# Patient Record
Sex: Female | Born: 1985 | Race: White | Hispanic: No | Marital: Married
Health system: Southern US, Academic
[De-identification: ages and names within clinical notes are randomized; demographics above are authoritative.]

## PROBLEM LIST (undated history)

## (undated) ENCOUNTER — Ambulatory Visit

## (undated) ENCOUNTER — Encounter

## (undated) ENCOUNTER — Encounter: Attending: Family | Primary: Family

## (undated) ENCOUNTER — Telehealth

## (undated) ENCOUNTER — Ambulatory Visit: Payer: MEDICARE

## (undated) ENCOUNTER — Ambulatory Visit
Payer: MEDICARE | Attending: Student in an Organized Health Care Education/Training Program | Primary: Student in an Organized Health Care Education/Training Program

## (undated) ENCOUNTER — Encounter
Attending: Student in an Organized Health Care Education/Training Program | Primary: Student in an Organized Health Care Education/Training Program

## (undated) ENCOUNTER — Inpatient Hospital Stay

## (undated) ENCOUNTER — Encounter: Attending: Physician Assistant | Primary: Physician Assistant

## (undated) ENCOUNTER — Ambulatory Visit: Attending: Pharmacist | Primary: Pharmacist

## (undated) ENCOUNTER — Telehealth
Attending: Student in an Organized Health Care Education/Training Program | Primary: Student in an Organized Health Care Education/Training Program

## (undated) ENCOUNTER — Ambulatory Visit
Attending: Student in an Organized Health Care Education/Training Program | Primary: Student in an Organized Health Care Education/Training Program

## (undated) ENCOUNTER — Ambulatory Visit: Payer: Medicare (Managed Care) | Attending: Family | Primary: Family

## (undated) VITALS — BP 138/108 | HR 79 | Temp 98.0°F | Resp 15 | Ht 65.0 in | Wt 270.0 lb

## (undated) DIAGNOSIS — F329 Major depressive disorder, single episode, unspecified: Secondary | ICD-10-CM

## (undated) DIAGNOSIS — M7730 Calcaneal spur, unspecified foot: Secondary | ICD-10-CM

## (undated) DIAGNOSIS — H9190 Unspecified hearing loss, unspecified ear: Secondary | ICD-10-CM

## (undated) DIAGNOSIS — M722 Plantar fascial fibromatosis: Secondary | ICD-10-CM

## (undated) DIAGNOSIS — E663 Overweight: Secondary | ICD-10-CM

## (undated) DIAGNOSIS — R351 Nocturia: Secondary | ICD-10-CM

## (undated) DIAGNOSIS — L309 Dermatitis, unspecified: Secondary | ICD-10-CM

## (undated) DIAGNOSIS — I10 Essential (primary) hypertension: Secondary | ICD-10-CM

## (undated) DIAGNOSIS — J302 Other seasonal allergic rhinitis: Secondary | ICD-10-CM

## (undated) DIAGNOSIS — K219 Gastro-esophageal reflux disease without esophagitis: Secondary | ICD-10-CM

## (undated) DIAGNOSIS — N200 Calculus of kidney: Secondary | ICD-10-CM

## (undated) DIAGNOSIS — Z87442 Personal history of urinary calculi: Secondary | ICD-10-CM

## (undated) DIAGNOSIS — N390 Urinary tract infection, site not specified: Secondary | ICD-10-CM

## (undated) DIAGNOSIS — F32A Depression, unspecified: Secondary | ICD-10-CM

## (undated) DIAGNOSIS — E669 Obesity, unspecified: Secondary | ICD-10-CM

## (undated) HISTORY — PX: TUBAL LIGATION: SHX77

## (undated) HISTORY — PX: TYMPANOSTOMY TUBE PLACEMENT: SHX32

## (undated) HISTORY — DX: Nocturia: R35.1

## (undated) HISTORY — DX: Major depressive disorder, single episode, unspecified: F32.9

## (undated) HISTORY — DX: Urinary tract infection, site not specified: N39.0

## (undated) HISTORY — DX: Calcaneal spur, unspecified foot: M77.30

## (undated) HISTORY — DX: Obesity, unspecified: E66.9

## (undated) HISTORY — DX: Calculus of kidney: N20.0

## (undated) HISTORY — DX: Depression, unspecified: F32.A

## (undated) HISTORY — DX: Dermatitis, unspecified: L30.9

## (undated) HISTORY — DX: Plantar fascial fibromatosis: M72.2

## (undated) HISTORY — DX: Overweight: E66.3

## (undated) HISTORY — DX: Unspecified hearing loss, unspecified ear: H91.90

---

## 2008-03-30 ENCOUNTER — Emergency Department: Payer: Self-pay | Admitting: Emergency Medicine

## 2009-04-09 ENCOUNTER — Ambulatory Visit: Payer: Self-pay | Admitting: Unknown Physician Specialty

## 2009-04-16 ENCOUNTER — Ambulatory Visit: Payer: Self-pay | Admitting: Unknown Physician Specialty

## 2010-08-22 ENCOUNTER — Emergency Department: Payer: Self-pay | Admitting: Emergency Medicine

## 2011-05-09 ENCOUNTER — Emergency Department: Payer: Self-pay | Admitting: Emergency Medicine

## 2011-05-12 ENCOUNTER — Emergency Department: Payer: Self-pay | Admitting: Emergency Medicine

## 2011-05-14 ENCOUNTER — Emergency Department: Payer: Self-pay | Admitting: Emergency Medicine

## 2011-07-31 ENCOUNTER — Ambulatory Visit: Payer: Self-pay | Admitting: Family Medicine

## 2011-08-07 ENCOUNTER — Emergency Department: Payer: Self-pay | Admitting: *Deleted

## 2011-11-17 ENCOUNTER — Ambulatory Visit: Payer: Self-pay | Admitting: Family Medicine

## 2012-02-01 ENCOUNTER — Encounter: Payer: Self-pay | Admitting: Maternal and Fetal Medicine

## 2012-02-13 ENCOUNTER — Observation Stay: Payer: Self-pay | Admitting: Obstetrics and Gynecology

## 2012-02-13 LAB — URINALYSIS, COMPLETE
Bacteria: NONE SEEN
Bilirubin,UR: NEGATIVE
Blood: NEGATIVE
Glucose,UR: NEGATIVE mg/dL
Ketone: NEGATIVE
Leukocyte Esterase: NEGATIVE
Nitrite: NEGATIVE
Ph: 6
Protein: NEGATIVE
RBC,UR: 1 /HPF
Specific Gravity: 1.01
Squamous Epithelial: 3
WBC UR: 1 /HPF

## 2012-03-13 ENCOUNTER — Observation Stay: Payer: Self-pay | Admitting: Obstetrics and Gynecology

## 2012-03-29 ENCOUNTER — Inpatient Hospital Stay: Payer: Self-pay

## 2012-03-29 LAB — PIH PROFILE
BUN: 6 mg/dL — ABNORMAL LOW (ref 7–18)
Co2: 24 mmol/L (ref 21–32)
HCT: 37.2 % (ref 35.0–47.0)
MCH: 28.6 pg (ref 26.0–34.0)
MCHC: 33.8 g/dL (ref 32.0–36.0)
Potassium: 3.9 mmol/L (ref 3.5–5.1)
RDW: 14.8 % — ABNORMAL HIGH (ref 11.5–14.5)
SGOT(AST): 11 U/L — ABNORMAL LOW (ref 15–37)
Sodium: 138 mmol/L (ref 136–145)
WBC: 14.2 10*3/uL — ABNORMAL HIGH (ref 3.6–11.0)

## 2012-03-29 LAB — PROTEIN / CREATININE RATIO, URINE: Protein/Creat. Ratio: 182 mg/gCREAT (ref 0–200)

## 2012-03-30 LAB — HEMATOCRIT: HCT: 29.5 % — ABNORMAL LOW (ref 35.0–47.0)

## 2012-09-07 ENCOUNTER — Emergency Department: Payer: Self-pay

## 2013-02-12 ENCOUNTER — Emergency Department: Payer: Self-pay | Admitting: Emergency Medicine

## 2013-02-17 ENCOUNTER — Ambulatory Visit: Payer: Self-pay | Admitting: Family Medicine

## 2013-03-17 ENCOUNTER — Emergency Department: Payer: Self-pay | Admitting: Emergency Medicine

## 2013-03-17 LAB — URINALYSIS, COMPLETE
Bilirubin,UR: NEGATIVE
Glucose,UR: NEGATIVE mg/dL (ref 0–75)
Ketone: NEGATIVE
Leukocyte Esterase: NEGATIVE
Protein: NEGATIVE
RBC,UR: 27 /HPF (ref 0–5)
Squamous Epithelial: 1
WBC UR: 3 /HPF (ref 0–5)

## 2013-03-17 LAB — CBC
MCH: 25.6 pg — ABNORMAL LOW (ref 26.0–34.0)
MCHC: 32.8 g/dL (ref 32.0–36.0)
RBC: 5.44 10*6/uL — ABNORMAL HIGH (ref 3.80–5.20)
RDW: 16.7 % — ABNORMAL HIGH (ref 11.5–14.5)

## 2013-04-03 ENCOUNTER — Encounter: Payer: Self-pay | Admitting: Obstetrics and Gynecology

## 2013-04-21 ENCOUNTER — Emergency Department: Payer: Self-pay | Admitting: Emergency Medicine

## 2013-04-21 LAB — URINALYSIS, COMPLETE
Bilirubin,UR: NEGATIVE
Blood: NEGATIVE
Glucose,UR: NEGATIVE mg/dL (ref 0–75)
Ph: 6 (ref 4.5–8.0)
Protein: NEGATIVE
RBC,UR: 1 /HPF (ref 0–5)
Specific Gravity: 1.017 (ref 1.003–1.030)
WBC UR: 5 /HPF (ref 0–5)

## 2013-04-21 LAB — COMPREHENSIVE METABOLIC PANEL
Alkaline Phosphatase: 81 U/L (ref 50–136)
Anion Gap: 8 (ref 7–16)
Calcium, Total: 9 mg/dL (ref 8.5–10.1)
Chloride: 107 mmol/L (ref 98–107)
Co2: 23 mmol/L (ref 21–32)
Creatinine: 0.55 mg/dL — ABNORMAL LOW (ref 0.60–1.30)
EGFR (African American): >60
EGFR (Non-African Amer.): >60
Glucose: 92 mg/dL (ref 65–99)
Osmolality: 272 (ref 275–301)
SGOT(AST): 18 U/L (ref 15–37)
Total Protein: 6.9 g/dL (ref 6.4–8.2)

## 2013-04-21 LAB — CBC
HCT: 38.6 % (ref 35.0–47.0)
HGB: 12.7 g/dL (ref 12.0–16.0)
MCHC: 33 g/dL (ref 32.0–36.0)
Platelet: 308 10*3/uL (ref 150–440)
RBC: 5 10*6/uL (ref 3.80–5.20)
WBC: 9.8 10*3/uL (ref 3.6–11.0)

## 2013-04-21 LAB — LIPASE, BLOOD: Lipase: 56 U/L — ABNORMAL LOW (ref 73–393)

## 2013-04-21 LAB — HCG, QUANTITATIVE, PREGNANCY: Beta Hcg, Quant.: 27331 m[IU]/mL — ABNORMAL HIGH

## 2013-04-21 LAB — WET PREP, GENITAL

## 2013-04-21 LAB — GC/CHLAMYDIA PROBE AMP

## 2013-04-22 LAB — URINE CULTURE

## 2013-05-08 ENCOUNTER — Encounter: Payer: Self-pay | Admitting: Maternal and Fetal Medicine

## 2013-05-22 ENCOUNTER — Encounter: Payer: Self-pay | Admitting: Obstetrics and Gynecology

## 2013-06-02 ENCOUNTER — Observation Stay: Payer: Self-pay | Admitting: Obstetrics and Gynecology

## 2013-07-17 ENCOUNTER — Encounter: Payer: Self-pay | Admitting: Maternal and Fetal Medicine

## 2013-08-03 ENCOUNTER — Observation Stay: Payer: Self-pay | Admitting: Obstetrics and Gynecology

## 2013-08-17 ENCOUNTER — Observation Stay: Payer: Self-pay | Admitting: Obstetrics and Gynecology

## 2013-08-24 ENCOUNTER — Observation Stay: Payer: Self-pay

## 2013-08-27 ENCOUNTER — Observation Stay: Payer: Self-pay | Admitting: Obstetrics and Gynecology

## 2013-08-30 ENCOUNTER — Observation Stay: Payer: Self-pay | Admitting: Obstetrics and Gynecology

## 2013-09-04 ENCOUNTER — Observation Stay: Payer: Self-pay | Admitting: Obstetrics and Gynecology

## 2013-09-05 ENCOUNTER — Observation Stay: Payer: Self-pay | Admitting: Obstetrics and Gynecology

## 2013-09-05 LAB — URINALYSIS, COMPLETE
Bilirubin,UR: NEGATIVE
Blood: NEGATIVE
Ph: 7 (ref 4.5–8.0)
Protein: 30
Specific Gravity: 1.015 (ref 1.003–1.030)
Squamous Epithelial: 14
WBC UR: 9 /HPF (ref 0–5)

## 2013-09-06 ENCOUNTER — Observation Stay: Payer: Self-pay | Admitting: Obstetrics and Gynecology

## 2013-09-11 ENCOUNTER — Observation Stay: Payer: Self-pay

## 2013-09-14 ENCOUNTER — Observation Stay: Payer: Self-pay

## 2013-09-18 ENCOUNTER — Observation Stay: Payer: Self-pay

## 2013-09-21 ENCOUNTER — Observation Stay: Payer: Self-pay | Admitting: Obstetrics and Gynecology

## 2013-09-25 ENCOUNTER — Observation Stay: Payer: Self-pay | Admitting: Obstetrics and Gynecology

## 2013-09-29 ENCOUNTER — Observation Stay: Payer: Self-pay

## 2013-10-01 ENCOUNTER — Inpatient Hospital Stay: Payer: Self-pay | Admitting: Obstetrics and Gynecology

## 2013-10-01 LAB — CBC WITH DIFFERENTIAL/PLATELET
Basophil %: 0.4 %
Eosinophil %: 0.3 %
HCT: 36.1 % (ref 35.0–47.0)
MCH: 29.7 pg (ref 26.0–34.0)
MCHC: 34.5 g/dL (ref 32.0–36.0)
MCV: 86 fL (ref 80–100)
Monocyte #: 0.7 x10 3/mm (ref 0.2–0.9)
Neutrophil #: 10 10*3/uL — ABNORMAL HIGH (ref 1.4–6.5)
Neutrophil %: 78.7 %
Platelet: 341 10*3/uL (ref 150–440)
RBC: 4.19 10*6/uL (ref 3.80–5.20)
WBC: 12.7 10*3/uL — ABNORMAL HIGH (ref 3.6–11.0)

## 2013-11-21 ENCOUNTER — Ambulatory Visit: Payer: Self-pay | Admitting: Obstetrics and Gynecology

## 2013-11-21 LAB — URINALYSIS, COMPLETE
Bacteria: NONE SEEN
Bilirubin,UR: NEGATIVE
Glucose,UR: NEGATIVE mg/dL (ref 0–75)
Ketone: NEGATIVE
Nitrite: NEGATIVE
Ph: 7 (ref 4.5–8.0)
RBC,UR: 1 /HPF (ref 0–5)
Specific Gravity: 1.016 (ref 1.003–1.030)

## 2013-12-01 ENCOUNTER — Ambulatory Visit: Payer: Self-pay | Admitting: Obstetrics and Gynecology

## 2014-02-23 DIAGNOSIS — M722 Plantar fascial fibromatosis: Secondary | ICD-10-CM | POA: Insufficient documentation

## 2014-02-23 DIAGNOSIS — M7730 Calcaneal spur, unspecified foot: Secondary | ICD-10-CM | POA: Insufficient documentation

## 2014-02-28 ENCOUNTER — Ambulatory Visit: Payer: Self-pay | Admitting: Podiatry

## 2014-03-14 DIAGNOSIS — M84376A Stress fracture, unspecified foot, initial encounter for fracture: Secondary | ICD-10-CM | POA: Insufficient documentation

## 2014-04-03 DIAGNOSIS — J309 Allergic rhinitis, unspecified: Secondary | ICD-10-CM | POA: Insufficient documentation

## 2014-05-01 ENCOUNTER — Emergency Department: Payer: Self-pay | Admitting: Emergency Medicine

## 2014-05-01 LAB — URINALYSIS, COMPLETE
Bilirubin,UR: NEGATIVE
Glucose,UR: NEGATIVE mg/dL (ref 0–75)
Ketone: NEGATIVE
NITRITE: NEGATIVE
PH: 5 (ref 4.5–8.0)
PROTEIN: NEGATIVE
RBC,UR: 2 /HPF (ref 0–5)
Specific Gravity: 1.012 (ref 1.003–1.030)
Squamous Epithelial: 1
WBC UR: 28 /HPF (ref 0–5)

## 2014-05-01 LAB — CBC
HCT: 35.5 % (ref 35.0–47.0)
HGB: 11.1 g/dL — ABNORMAL LOW (ref 12.0–16.0)
MCH: 21.8 pg — AB (ref 26.0–34.0)
MCHC: 31.4 g/dL — AB (ref 32.0–36.0)
MCV: 69 fL — ABNORMAL LOW (ref 80–100)
Platelet: 429 10*3/uL (ref 150–440)
RBC: 5.11 10*6/uL (ref 3.80–5.20)
RDW: 18.8 % — ABNORMAL HIGH (ref 11.5–14.5)
WBC: 14.6 10*3/uL — ABNORMAL HIGH (ref 3.6–11.0)

## 2014-05-01 LAB — BASIC METABOLIC PANEL
Anion Gap: 8 (ref 7–16)
BUN: 11 mg/dL (ref 7–18)
CALCIUM: 9.2 mg/dL (ref 8.5–10.1)
CREATININE: 1.39 mg/dL — AB (ref 0.60–1.30)
Chloride: 102 mmol/L (ref 98–107)
Co2: 24 mmol/L (ref 21–32)
EGFR (African American): >60
EGFR (Non-African Amer.): 52 — ABNORMAL LOW
Glucose: 95 mg/dL (ref 65–99)
Osmolality: 267 (ref 275–301)
Potassium: 2.8 mmol/L — ABNORMAL LOW (ref 3.5–5.1)
SODIUM: 134 mmol/L — AB (ref 136–145)

## 2014-05-01 LAB — LIPASE, BLOOD: Lipase: 64 U/L — ABNORMAL LOW (ref 73–393)

## 2014-08-09 ENCOUNTER — Emergency Department: Payer: Self-pay | Admitting: Emergency Medicine

## 2014-08-09 LAB — COMPREHENSIVE METABOLIC PANEL
ALBUMIN: 3.5 g/dL (ref 3.4–5.0)
ANION GAP: 8 (ref 7–16)
Alkaline Phosphatase: 90 U/L
BUN: 10 mg/dL (ref 7–18)
Bilirubin,Total: 0.4 mg/dL (ref 0.2–1.0)
CALCIUM: 8.8 mg/dL (ref 8.5–10.1)
CREATININE: 1.04 mg/dL (ref 0.60–1.30)
Chloride: 108 mmol/L — ABNORMAL HIGH (ref 98–107)
Co2: 23 mmol/L (ref 21–32)
EGFR (African American): >60
EGFR (Non-African Amer.): >60
Glucose: 127 mg/dL — ABNORMAL HIGH (ref 65–99)
OSMOLALITY: 278 (ref 275–301)
POTASSIUM: 4 mmol/L (ref 3.5–5.1)
SGOT(AST): 27 U/L (ref 15–37)
SGPT (ALT): 22 U/L
SODIUM: 139 mmol/L (ref 136–145)
Total Protein: 7.2 g/dL (ref 6.4–8.2)

## 2014-08-09 LAB — LIPASE, BLOOD: Lipase: 122 U/L (ref 73–393)

## 2014-08-09 LAB — CBC WITH DIFFERENTIAL/PLATELET
BASOS PCT: 0.7 %
Basophil #: 0.1 10*3/uL (ref 0.0–0.1)
Eosinophil #: 0.2 10*3/uL (ref 0.0–0.7)
Eosinophil %: 1.4 %
HCT: 40.5 % (ref 35.0–47.0)
HGB: 12.6 g/dL (ref 12.0–16.0)
Lymphocyte #: 2 10*3/uL (ref 1.0–3.6)
Lymphocyte %: 15.8 %
MCH: 23.3 pg — AB (ref 26.0–34.0)
MCHC: 31.2 g/dL — ABNORMAL LOW (ref 32.0–36.0)
MCV: 75 fL — AB (ref 80–100)
MONOS PCT: 4.7 %
Monocyte #: 0.6 x10 3/mm (ref 0.2–0.9)
Neutrophil #: 9.9 10*3/uL — ABNORMAL HIGH (ref 1.4–6.5)
Neutrophil %: 77.4 %
Platelet: 425 10*3/uL (ref 150–440)
RBC: 5.42 10*6/uL — ABNORMAL HIGH (ref 3.80–5.20)
RDW: 17.3 % — AB (ref 11.5–14.5)
WBC: 12.8 10*3/uL — AB (ref 3.6–11.0)

## 2014-08-09 LAB — URINALYSIS, COMPLETE
Bilirubin,UR: NEGATIVE
GLUCOSE, UR: NEGATIVE mg/dL (ref 0–75)
KETONE: NEGATIVE
NITRITE: NEGATIVE
PH: 7 (ref 4.5–8.0)
Protein: NEGATIVE
RBC,UR: 226 /HPF (ref 0–5)
Specific Gravity: 1.015 (ref 1.003–1.030)
Squamous Epithelial: 10
WBC UR: 137 /HPF (ref 0–5)

## 2014-08-16 DIAGNOSIS — N200 Calculus of kidney: Secondary | ICD-10-CM | POA: Insufficient documentation

## 2014-10-16 ENCOUNTER — Ambulatory Visit: Payer: Self-pay | Admitting: Urology

## 2014-10-22 ENCOUNTER — Ambulatory Visit: Payer: Self-pay | Admitting: Urology

## 2015-03-23 NOTE — Op Note (Signed)
PATIENT NAME:  Brittney Sanders, Brittney Sanders MR#:  [id number] DATE OF BIRTH:  November 26, 1986  DATE OF PROCEDURE:  12/01/2013  PREOPERATIVE DIAGNOSIS: Desires permanent sterilization.   POSTOPERATIVE DIAGNOSIS: Desires permanent sterilization.   PROCEDURE PERFORMED:  Laparoscopic tubal cautery.   SURGEON: Ricky L. Logan BoresEvans, Sanders.D.   ANESTHESIA: General endotracheal.   FINDINGS: Morbidly obese abdomen. Grossly normal uterus, tubes and ovaries.   ESTIMATED BLOOD LOSS: Minimal.   COMPLICATIONS: None.   DRAINS: In/out catheter with a latex-free Foley before the case with return of approximately 50 mL of clear urine.   PROCEDURE IN DETAIL: The patient was consented and stated understanding of permanence of tubal and the risk of ectopic, risk of surgery, etc., and also the options of less permanent means.  Fter obtaining informed consent, taken to the operating room and placed in the supine position where anesthesia was initiated and placed in the dorsal lithotomy position using Allen stirrups, prepped and draped in the usual sterile fashion.  The cervix was visualized.  Hulka tenaculum was placed, bladder was drained.  Area at 6 o'clock of the umbilicus was infused with 0.5% Sensorcaine, then a #15 blade was (Dictation Anomaly) incision through which a 10 mm port was placed. Pneumoperitoneum was established. The patient was placed in as steep a Trendelenburg as could be tolerated due to the patient's obesity, and tubes were visualized and bilateral tubal cautery was carried out in the mid ampullary region in the usual fashion using Kleppinger forceps.    Areas were seen to be hemostatic. Pneumoperitoneum was allowed to resolve. Port removed. The incision was closed with deep of 0. Band-Aid was placed. The patient tolerated the procedure well.   I anticipate a routine postoperative course.   A prescription for Norco 5/325, 30 of those, one half to 2, q.6 p.r.n. pain. No refills were given, then prescriptions for light  activity, etc., and I will see her back in the office in 2 weeks.    ____________________________ Reatha Harpsicky L. Logan BoresEvans, MD rle:dmm D: 12/01/2013 08:04:40 ET T: 12/01/2013 09:29:49 ET JOB#: [id number]  cc: Ricky L. Logan BoresEvans, MD, <Dictator> Augustina MoodICK L Vernadette Stutsman MD ELECTRONICALLY SIGNED 12/01/2013 12:46

## 2015-03-30 ENCOUNTER — Emergency Department
Admit: 2015-03-30 | Discharge: 2015-03-31 | Disposition: A | Discharge status: Home / Self Care | Payer: Medicare HMO | Source: Ambulatory Visit | Attending: Emergency Medicine | Admitting: Emergency Medicine

## 2015-03-30 DIAGNOSIS — Z792 Long term (current) use of antibiotics: Secondary | ICD-10-CM | POA: Diagnosis not present

## 2015-03-30 DIAGNOSIS — J069 Acute upper respiratory infection, unspecified: Secondary | ICD-10-CM

## 2015-03-30 DIAGNOSIS — J029 Acute pharyngitis, unspecified: Secondary | ICD-10-CM | POA: Diagnosis present

## 2015-03-30 HISTORY — DX: Other seasonal allergic rhinitis: J30.2

## 2015-03-31 ENCOUNTER — Encounter: Payer: Self-pay | Admitting: Emergency Medicine

## 2015-03-31 MED ORDER — DEXAMETHASONE SODIUM PHOSPHATE 10 MG/ML IJ SOLN
INTRAMUSCULAR | Status: AC
  Filled 2015-03-31: qty 1

## 2015-03-31 MED ORDER — AMOXICILLIN 500 MG PO CAPS
ORAL_CAPSULE | ORAL | Status: AC
  Filled 2015-03-31: qty 1

## 2015-03-31 MED ORDER — AMOXICILLIN 500 MG PO CAPS
500.0000 mg | ORAL_CAPSULE | Freq: Once | ORAL | Status: AC
  Administered 2015-03-31: 500 mg via ORAL

## 2015-03-31 MED ORDER — DEXAMETHASONE SODIUM PHOSPHATE 10 MG/ML IJ SOLN
10.0000 mg | Freq: Once | INTRAMUSCULAR | Status: AC
  Administered 2015-03-31: 10 mg via INTRAMUSCULAR

## 2015-03-31 MED ORDER — MAGIC MOUTHWASH: 10.0000 mL | Freq: Three times a day (TID) | ORAL | Status: DC | PRN

## 2015-03-31 MED ORDER — MAGIC MOUTHWASH
10.0000 mL | Freq: Once | ORAL | Status: AC
  Administered 2015-03-31: 10 mL via ORAL
  Filled 2015-03-31 (×2): qty 10

## 2015-03-31 MED ORDER — AMOXICILLIN 500 MG PO CAPS: 500.0000 mg | ORAL_CAPSULE | Freq: Three times a day (TID) | ORAL | Status: DC

## 2015-03-31 NOTE — ED Notes (Signed)
Pt reports experiencing cough, sore throat, voice hoarsness, and nasal congestion for the past 2 weeks. Pt denies hx of any respiratory illness. Pt reports hx of seasonal allergies.

## 2015-03-31 NOTE — ED Provider Notes (Signed)
[facility] Emergency Department Provider Note    ____________________________________________  Time seen: 0250  I have reviewed the triage vital signs and the nursing notes.  ________ HISTORY ________  Chief Complaint Cough; Sore Throat; and Nasal Congestion     HPI Brittney Sanders is a 29 y.o. female who presents with a two-week history of nonproductive cough, congestion, sore throat.Patient states symptoms are getting worse. Complains of 10 out of 10 pain to throat. Denies fever, chills, nausea, vomiting, shortness of breath.     Past Medical History  Diagnosis Date  . Seasonal allergies     There are no active problems to display for this patient.   History reviewed. No pertinent past surgical history.  Current Outpatient Rx  Name  Route  Sig  Dispense  Refill  . Alum & Mag Hydroxide-Simeth (MAGIC MOUTHWASH) SOLN   Oral   Take 10 mLs by mouth 3 (three) times daily as needed (throat pain).   30 mL   0   . amoxicillin (AMOXIL) 500 MG capsule   Oral   Take 1 capsule (500 mg total) by mouth 3 (three) times daily.   21 capsule   0     Allergies Sulfa antibiotics  History reviewed. No pertinent family history.  Social History History  Substance Use Topics  . Smoking status: Never Smoker   . Smokeless tobacco: Not on file  . Alcohol Use: No    Review of Systems  Constitutional: Negative for fever. Eyes: Negative for visual changes. ENT: Positive for sore throat. Positive for nasal congestion. Cardiovascular: Negative for chest pain. Respiratory: Negative for shortness of breath. Positive for cough. Gastrointestinal: Negative for abdominal pain, vomiting and diarrhea. Genitourinary: Negative for dysuria. Musculoskeletal: Negative for back pain. Skin: Negative for rash. Neurological: Negative for headaches, focal weakness or numbness.  10-point ROS otherwise negative.  _______________ PHYSICAL  EXAM: _______________  VITAL SIGNS:  ED Triage Vitals  Enc Vitals Group     BP 03/31/15 0037 117/80 mmHg     Pulse Rate 03/31/15 0037 77     Resp 03/31/15 0037 14     Temp 03/31/15 0037 98 F (36.7 C)     Temp Source 03/31/15 0037 Oral     SpO2 03/31/15 0037 99 %     Weight --      Height --      Head Cir --      Peak Flow --      Pain Score 03/31/15 0140 8     Pain Loc --      Pain Edu? --      Excl. in GC? --      Constitutional: Alert and oriented. Well appearing and in no distress. Eyes: Conjunctivae are normal. PERRL. Normal extraocular movements. ENT   Head: Normocephalic and atraumatic.   Nose: Congestion/rhinnorhea.   Mouth/Throat: Mucous membranes are moist. Mild pharyngeal erythema. Hoarse voice. Mild bilateral tonsillar swelling. No peritonsillar abscess. No tonsillar exudates.   Neck: No stridor.  Hematological/Lymphatic/Immunilogical: Shotty anterior cervical lymphadenopathy. Cardiovascular: Normal rate, regular rhythm. Normal and symmetric distal pulses are present in all extremities. No murmurs, rubs, or gallops. Respiratory: Normal respiratory effort without tachypnea nor retractions. Breath sounds are clear and equal bilaterally. No wheezes/rales/rhonchi. Gastrointestinal: Soft and nontender. No distention. No abdominal bruits. There is no CVA tenderness. Genitourinary: Deferred Musculoskeletal: Nontender with normal range of motion in all extremities. No joint effusions.   Right lower leg:  No tenderness or edema.   Left  lower leg:  No tenderness or edema. Neurologic:  Normal speech and language. No gross focal neurologic deficits are appreciated. Speech is normal. No gait instability. Skin:  Skin is warm, dry and intact. No rash noted. Psychiatric: Mood and affect are normal. Speech and behavior are normal. Patient exhibits appropriate insight and judgment.  ____ EKG ____  None   ___________ RADIOLOGY ___________  Chest x-ray  per Dr. Reche Dixonalbot, reviewed by me: No acute cardiopulmonary process.  _____________ PROCEDURES _____________  Procedure(s) performed: None  Critical Care performed: None   ______________________________________________________ INITIAL IMPRESSION / ASSESSMENT AND PLAN / ED COURSE ______________________________________________________  Pertinent labs & imaging results that were available during my care of the patient were reviewed by me and considered in my medical decision making (see chart for details).  29 year old female with upper respiratory illness and pharyngitis. Single intramuscular dose of Decadron in ED, amoxicillin, Dukes Magic mouthwash, follow up PCP. Room air sats 96% on discharge. Discussed with patient and given strict return precautions. Patient verbalizes understanding and agrees.   ____________________________________________ FINAL CLINICAL IMPRESSION(S) / ED DIAGNOSES ____________________________________________  Final diagnoses:  Pharyngitis  Upper respiratory infection       Irean HongJade J Sung, MD 03/31/15 [phone]

## 2015-03-31 NOTE — ED Notes (Signed)
MD delay explained to pt.

## 2015-03-31 NOTE — Discharge Instructions (Signed)
1. TAKE ANTIBIOTIC AS PRESCRIBED (AMOXICILLIN  THREE TIMES DAILY X 7 DAYS). 2. USE DUKE'S MAGIC MOUTHWASH AS NEEDED FOR SORE THROAT. 3. RETURN TO THE ER FOR WORSENING SYMPTOMS, PERSISTENT VOMITING, DIFFICULTY BREATHING OR OTHER CONCERNS.   Pharyngitis Pharyngitis is a sore throat (pharynx). There is redness, pain, and swelling of your throat. HOME CARE   Drink enough fluids to keep your pee (urine) clear or pale yellow.  Only take medicine as told by your doctor.  You may get sick again if you do not take medicine as told. Finish your medicines, even if you start to feel better.  Do not take aspirin.  Rest.  Rinse your mouth (gargle) with salt water ( tsp of salt per 1 qt of water) every 1-2 hours. This will help the pain.  If you are not at risk for choking, you can suck on hard candy or sore throat lozenges. GET HELP IF:  You have large, tender lumps on your neck.  You have a rash.  You cough up green, yellow-brown, or bloody spit. GET HELP RIGHT AWAY IF:   You have a stiff neck.  You drool or cannot swallow liquids.  You throw up (vomit) or are not able to keep medicine or liquids down.  You have very bad pain that does not go away with medicine.  You have problems breathing (not from a stuffy nose). MAKE SURE YOU:   Understand these instructions.  Will watch your condition.  Will get help right away if you are not doing well or get worse. Document Released: 05/04/2008 Document Revised: 09/06/2013 Document Reviewed: 07/24/2013 [facility] Patient Information 2015 [place], [place]. This information is not intended to replace advice given to you by your health care provider. Make sure you discuss any questions you have with your health care provider.  Salt Water Gargle This solution will help make your mouth and throat feel better. HOME CARE INSTRUCTIONS   Mix 1 teaspoon of salt in 8 ounces of warm water.  Gargle with this solution as much or often as you  need or as directed. Swish and gargle gently if you have any sores or wounds in your mouth.  Do not swallow this mixture. Document Released: 08/20/2004 Document Revised: 02/08/2012 Document Reviewed: 01/11/2009 [facility] Patient Information 2015 [place], [place]. This information is not intended to replace advice given to you by your health care provider. Make sure you discuss any questions you have with your health care provider.  Upper Respiratory Infection, Adult An upper respiratory infection (URI) is also sometimes known as the common cold. The upper respiratory tract includes the nose, sinuses, throat, trachea, and bronchi. Bronchi are the airways leading to the lungs. Most people improve within 1 week, but symptoms can last up to 2 weeks. A residual cough may last even longer.  CAUSES Many different viruses can infect the tissues lining the upper respiratory tract. The tissues become irritated and inflamed and often become very moist. Mucus production is also common. A cold is contagious. You can easily spread the virus to others by oral contact. This includes kissing, sharing a glass, coughing, or sneezing. Touching your mouth or nose and then touching a surface, which is then touched by another person, can also spread the virus. SYMPTOMS  Symptoms typically develop 1 to 3 days after you come in contact with a cold virus. Symptoms vary from person to person. They may include:  Runny nose.  Sneezing.  Nasal congestion.  Sinus irritation.  Sore throat.  Loss  of voice (laryngitis).  Cough.  Fatigue.  Muscle aches.  Loss of appetite.  Headache.  Low-grade fever. DIAGNOSIS  You might diagnose your own cold based on familiar symptoms, since most people get a cold 2 to 3 times a year. Your caregiver can confirm this based on your exam. Most importantly, your caregiver can check that your symptoms are not due to another disease such as strep throat, sinusitis, pneumonia, asthma, or  epiglottitis. Blood tests, throat tests, and X-rays are not necessary to diagnose a common cold, but they may sometimes be helpful in excluding other more serious diseases. Your caregiver will decide if any further tests are required. RISKS AND COMPLICATIONS  You may be at risk for a more severe case of the common cold if you smoke cigarettes, have chronic heart disease (such as heart failure) or lung disease (such as asthma), or if you have a weakened immune system. The very young and very old are also at risk for more serious infections. Bacterial sinusitis, middle ear infections, and bacterial pneumonia can complicate the common cold. The common cold can worsen asthma and chronic obstructive pulmonary disease (COPD). Sometimes, these complications can require emergency medical care and may be life-threatening. PREVENTION  The best way to protect against getting a cold is to practice good hygiene. Avoid oral or hand contact with people with cold symptoms. Wash your hands often if contact occurs. There is no clear evidence that vitamin C, vitamin E, echinacea, or exercise reduces the chance of developing a cold. However, it is always recommended to get plenty of rest and practice good nutrition. TREATMENT  Treatment is directed at relieving symptoms. There is no cure. Antibiotics are not effective, because the infection is caused by a virus, not by bacteria. Treatment may include:  Increased fluid intake. Sports drinks offer valuable electrolytes, sugars, and fluids.  Breathing heated mist or steam (vaporizer or shower).  Eating chicken soup or other clear broths, and maintaining good nutrition.  Getting plenty of rest.  Using gargles or lozenges for comfort.  Controlling fevers with ibuprofen or acetaminophen as directed by your caregiver.  Increasing usage of your inhaler if you have asthma. Zinc gel and zinc lozenges, taken in the first 24 hours of the common cold, can shorten the duration  and lessen the severity of symptoms. Pain medicines may help with fever, muscle aches, and throat pain. A variety of non-prescription medicines are available to treat congestion and runny nose. Your caregiver can make recommendations and may suggest nasal or lung inhalers for other symptoms.  HOME CARE INSTRUCTIONS   Only take over-the-counter or prescription medicines for pain, discomfort, or fever as directed by your caregiver.  Use a warm mist humidifier or inhale steam from a shower to increase air moisture. This may keep secretions moist and make it easier to breathe.  Drink enough water and fluids to keep your urine clear or pale yellow.  Rest as needed.  Return to work when your temperature has returned to normal or as your caregiver advises. You may need to stay home longer to avoid infecting others. You can also use a face mask and careful hand washing to prevent spread of the virus. SEEK MEDICAL CARE IF:   After the first few days, you feel you are getting worse rather than better.  You need your caregiver's advice about medicines to control symptoms.  You develop chills, worsening shortness of breath, or brown or red sputum. These may be signs of pneumonia.  You  develop yellow or brown nasal discharge or pain in the face, especially when you bend forward. These may be signs of sinusitis.  You develop a fever, swollen neck glands, pain with swallowing, or white areas in the back of your throat. These may be signs of strep throat. SEEK IMMEDIATE MEDICAL CARE IF:   You have a fever.  You develop severe or persistent headache, ear pain, sinus pain, or chest pain.  You develop wheezing, a prolonged cough, cough up blood, or have a change in your usual mucus (if you have chronic lung disease).  You develop sore muscles or a stiff neck. Document Released: 05/12/2001 Document Revised: 02/08/2012 Document Reviewed: 02/21/2014 [facility] Patient Information 2015 [place], [place]. This  information is not intended to replace advice given to you by your health care provider. Make sure you discuss any questions you have with your health care provider.

## 2015-04-09 NOTE — H&P (Signed)
L&D Evaluation:  History:   HPI 29 yo G1P0 @ 37.1wks EDC 04/02/12 by 6wk uls presents at 39 3/7 weeks with c/o constant lower back pain and contractions which she rated as 8 out of 10. Denies bleeding or LOF. +nausea with vomiting x1 her in L&D.   +FM  [facility].  Preg c/b obesity (BMI 47) and initial dx of GDM based on elevated 1hr glucola and 2 abnomal values on 3hr OGTT.  She was [facility] at [facility] for evaluation of this and told that she did not have GDM based on their criteria. EFW at 30 weeks was 3#12oz (62.3%) She is also hearing impaired.  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  [facility] and a third is pending. 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    Patient's Surgical History Ear surgery (myringotomies x2 and tympanoplasty)    Medications Pre Natal Vitamins  ASA 81 mgm daily    Allergies Sulfa    Social History none    Family History Non-Contributory   ROS:   ROS see HPI   Exam:   Vital Signs initially BPs134/90, 131/87, 141/91, 142/92, 143/84, 134/76, now 118/72    Urine Protein pro/cr=182 mgm    General appears uncomfortable, rocking while sitting on couch    Mental Status clear    Chest clear    Heart normal sinus rhythm, no murmur/gallop/rubs    Abdomen gravid, non-tender    Estimated Fetal Weight 8#    Fetal Position cephalic (OP) on US    Edema 2+    Reflexes 2+    Pelvic no external lesions, changed from 2/90%/OOP to 2-3/90%/-2 per RN exam    Mebranes Intact    FHT normal rate with no decels, 135 with accels to 160s    FHT Description moderate vaiability    Ucx irregular, and difficult to pick up with toco    Other PIH labs WNL   Impression:    Impression IUP at 39 3/7 weeks ?in early labor/latent labor   Plan:   Plan Discussed therapeutic rest-and pt agrees. Morphine and Phenergan IM now.   Electronic Signatures: Trinna BalloonGutierrez, Mikenzi Raysor L (CNM)  (Signed 30-Apr-13 08:24)  Authored: L&D Evaluation   Last Updated: 30-Apr-13 08:24 by Trinna BalloonGutierrez, Shareen Capwell L (CNM)

## 2015-04-09 NOTE — H&P (Signed)
L&Sanders Evaluation:  History:   HPI 29 yo G1at [redacted] wks GA by U/S.  Pregnancy has been complicated by obesity (BMI >40).  She presents with occasional cramps. She notes +FM, no LOF, no VB. O+, RI, HBsAg neg, GBS unk    Patient's Medical History Asthma  obesity    Patient's Surgical History cosmetic ear surgery    Medications Pre Natal Vitamins  ProAir HFA    Allergies PCN, Sulfa    Social History none    Family History Non-Contributory   ROS:   ROS All systems were reviewed.  HEENT, CNS, GI, GU, Respiratory, CV, Renal and Musculoskeletal systems were found to be normal.   Exam:   Vital Signs stable    General no apparent distress    Mental Status clear    Chest clear    Heart normal sinus rhythm    Abdomen gravid, non-tender    Back no CVAT    Edema no edema    Pelvic no external lesions, cervix closed and thick    Mebranes Intact    FHT normal rate with no decels    FHT Description 125/mod var/+accels/no decels    Skin no lesions    Other UA negative   Impression:   Impression no evidence of preterm labor   Plan:   Plan UA, EFM/NST, discharge    Follow Up Appointment already scheduled. 3/25 [facility]   Electronic Signatures: Brittney Sanders, Brittney Sanders (MD)  (Signed 16-Mar-13 06:06)  Authored: L&Sanders Evaluation   Last Updated: 16-Mar-13 06:06 by Brittney Sanders, Brittney Sanders (MD)

## 2015-04-09 NOTE — H&P (Signed)
L&D Evaluation:  History:  HPI 29 y/o G2P1001 @ 38+wks EDC 10/12/13 arrives with SROM clear fluid small show, irregular mild uc's, baby moving well. Care @ [facility] Elevated ONTD with nl US. Close interconceptual spacing. 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  HEENT, CNS, GI, GU, Respiratory, CV, Renal and Musculoskeletal systems were found to be normal.   Exam:  Vital Signs stable   Urine Protein not completed   General no apparent distress   Mental Status clear   Chest clear   Heart normal sinus rhythm   Abdomen gravid, non-tender   Estimated Fetal Weight Average for gestational age   Fetal Position vtx   Fundal Height term   Back no CVAT   Edema 1+  pedal   Reflexes 1+   Clonus negative   Pelvic no external lesions, 3-4cm 80% vtx @ -1 clear fluid nl show   Mebranes Ruptured   Description clear   FHT 130'3 140's baseline with accels   Ucx irregular, mild mod   Skin dry   Lymph no lymphadenopathy   Impression:  Impression early labor   Plan:  Plan EFM/NST, monitor contractions and for cervical change   Comments Admitted, knows what to expect 2nd baby. Plans natural childbirth, family at bedside supportive.   Electronic Signatures: Albertina ParrLugiano, Sheamus Hasting B (CNM)  (Signed [phone]-Nov-14 11:13)  Authored: L&D Evaluation   Last Updated: 02-Nov-14 11:13 by Albertina ParrLugiano, Daquavion Catala B (CNM)

## 2015-04-09 NOTE — H&P (Signed)
L&D Evaluation:  History:   HPI 29 yo G1P0 @ 37.1wks EDC 04/02/12 by 6wk uls presents c/o LOF.  She reports going to the bathroom to check because her panties were wet.  Denies a gush of fluid.  Has had no leakage since that initial episode.  +FM, no ctxs or VB.  [facility].  Preg c/b obesity (BMI 47) and initial dx of GDM based on elevated 1hr glucola and 2 abnomal values on 3hr OGTT.  [facility] of this and told that she did not have GDM based on their criteria.  She is also hearing impaired.  She was found to be MRSA pos in [facility] and was treated but never had documentation of neg testing afterwards.  [facility] was pos for Staph aureus.    Presents with leaking fluid    Patient's Medical History No Chronic Illness    Patient's Surgical History Ear surgery    Medications Pre Natal Vitamins    Allergies Sulfa    Social History none    Family History Non-Contributory   ROS:   ROS see HPI, all others reviewed and neg   Exam:   Vital Signs stable    General no apparent distress    Mental Status clear    Abdomen gravid, non-tender, obese    Pelvic no external lesions, cervix closed and thick    Mebranes Intact, Neg pool, nitrazine and fern    FHT normal rate with no decels    Fetal Heart Rate 140     Ucx absent    Skin dry   Impression:   Impression 37.1wk IUP, leaking fluid r/o SROM   Plan:   Comments Exam neg for spontaneous rupture of membranes.  NST reactive.  Pt given labor preecautions.  To keep sch appt this coming Thursday.    Follow Up Appointment already scheduled   Electronic Signatures: Senaida LangeWeaver-Lee, Corinna Burkman (MD)  (Signed 14-Apr-13 18:48)  Authored: L&D Evaluation   Last Updated: 14-Apr-13 18:48 by Senaida LangeWeaver-Lee, Mayukha Symmonds (MD)

## 2015-08-06 ENCOUNTER — Encounter: Payer: Self-pay | Admitting: *Deleted

## 2015-08-06 DIAGNOSIS — L02211 Cutaneous abscess of abdominal wall: Secondary | ICD-10-CM | POA: Diagnosis not present

## 2015-08-06 DIAGNOSIS — L03311 Cellulitis of abdominal wall: Secondary | ICD-10-CM | POA: Insufficient documentation

## 2015-08-06 DIAGNOSIS — Z792 Long term (current) use of antibiotics: Secondary | ICD-10-CM | POA: Diagnosis not present

## 2015-08-06 NOTE — ED Notes (Signed)
Pt has abscess to right lower abdomen.  Area red,swollen and painful.  Sx for 3 days.

## 2015-08-07 ENCOUNTER — Emergency Department
Admission: EM | Admit: 2015-08-07 | Discharge: 2015-08-07 | Disposition: A | Discharge status: Home / Self Care | Payer: Medicare HMO | Attending: Emergency Medicine | Admitting: Emergency Medicine

## 2015-08-07 DIAGNOSIS — L0390 Cellulitis, unspecified: Secondary | ICD-10-CM

## 2015-08-07 DIAGNOSIS — L0291 Cutaneous abscess, unspecified: Secondary | ICD-10-CM

## 2015-08-07 MED ORDER — DOXYCYCLINE HYCLATE 100 MG PO TABS
100.0000 mg | ORAL_TABLET | Freq: Once | ORAL | Status: AC
  Administered 2015-08-07: 100 mg via ORAL
  Filled 2015-08-07: qty 1

## 2015-08-07 MED ORDER — LIDOCAINE HCL (PF) 1 % IJ SOLN
INTRAMUSCULAR | Status: AC
  Administered 2015-08-07: 5 mL via INTRADERMAL
  Filled 2015-08-07: qty 5

## 2015-08-07 MED ORDER — DOXYCYCLINE HYCLATE 100 MG PO CAPS: ORAL_CAPSULE | ORAL | Status: DC

## 2015-08-07 MED ORDER — CEPHALEXIN 500 MG PO CAPS
500.0000 mg | ORAL_CAPSULE | Freq: Once | ORAL | Status: AC
  Administered 2015-08-07: 500 mg via ORAL
  Filled 2015-08-07: qty 1

## 2015-08-07 MED ORDER — LIDOCAINE HCL (PF) 1 % IJ SOLN
5.0000 mL | Freq: Once | INTRAMUSCULAR | Status: AC
  Administered 2015-08-07: 5 mL via INTRADERMAL

## 2015-08-07 MED ORDER — LIDOCAINE-EPINEPHRINE (PF) 1 %-1:200000 IJ SOLN
INTRAMUSCULAR | Status: AC
  Filled 2015-08-07: qty 30

## 2015-08-07 MED ORDER — CEPHALEXIN 500 MG PO CAPS: 500.0000 mg | ORAL_CAPSULE | Freq: Four times a day (QID) | ORAL | Status: DC

## 2015-08-07 NOTE — ED Provider Notes (Signed)
[facility] Emergency Department Provider Note  ____________________________________________  Time seen: Approximately 2:42 AM  I have reviewed the triage vital signs and the nursing notes.   HISTORY  Chief Complaint Abscess    HPI Brittney Sanders is a 29 y.o. female presents with worsening abscess on RLQ of abdomen for about 3 days.  History of abscesses requiring I&D.  Denies fever/chills, N/V/D, abdominal pain other than at the site.  Report large area of redness.  States she has been trying to open it herself and vigorously squeezing the region trying to get out the pus. Describes onset as gradual, severe in intensity.   Past Medical History  Diagnosis Date  . Seasonal allergies     There are no active problems to display for this patient.   No past surgical history on file.  Current Outpatient Rx  Name  Route  Sig  Dispense  Refill  . Alum & Mag Hydroxide-Simeth (MAGIC MOUTHWASH) SOLN   Oral   Take 10 mLs by mouth 3 (three) times daily as needed (throat pain).   30 mL   0   . amoxicillin (AMOXIL) 500 MG capsule   Oral   Take 1 capsule (500 mg total) by mouth 3 (three) times daily.   21 capsule   0   . cephALEXin (KEFLEX) 500 MG capsule   Oral   Take 1 capsule (500 mg total) by mouth 4 (four) times daily.   40 capsule   0   . doxycycline (VIBRAMYCIN) 100 MG capsule      Take 1 capsule (100 mg) by mouth twice daily for 10 days.   20 capsule   0     Allergies Sulfa antibiotics  No family history on file.  Social History Social History  Substance Use Topics  . Smoking status: Never Smoker   . Smokeless tobacco: None  . Alcohol Use: No    Review of Systems Constitutional: No fever/chills Eyes: No visual changes. ENT: No sore throat. Cardiovascular: Denies chest pain. Respiratory: Denies shortness of breath. Gastrointestinal: No abdominal pain except at abscess.  No nausea, no vomiting.  No diarrhea.  No  constipation. Genitourinary: Negative for dysuria. Musculoskeletal: Negative for back pain. Skin: abscess on RLQ Neurological: Negative for headaches, focal weakness or numbness.  10-point ROS otherwise negative.  ____________________________________________   PHYSICAL EXAM:  VITAL SIGNS: ED Triage Vitals  Enc Vitals Group     BP 08/06/15 2233 127/66 mmHg     Pulse Rate 08/06/15 2233 72     Resp 08/06/15 2233 20     Temp 08/06/15 2233 98.5 F (36.9 C)     Temp Source 08/06/15 2233 Oral     SpO2 08/06/15 2233 99 %     Weight 08/06/15 2233 258 lb (117.028 kg)     Height 08/06/15 2233  (1.6 m)     Head Cir --      Peak Flow --      Pain Score 08/06/15 2235 6     Pain Loc --      Pain Edu? --      Excl. in GC? --     Constitutional: Alert and oriented. Well appearing and in no acute distress. Eyes: Conjunctivae are normal. PERRL. EOMI. Mouth/Throat: Mucous membranes are moist.  Oropharynx non-erythematous. Cardiovascular: Normal rate, regular rhythm. Grossly normal heart sounds.  Good peripheral circulation. Respiratory: Normal respiratory effort.  No retractions. Lungs CTAB. Gastrointestinal: Obese. Soft and nontender. No distention. See Skin  exam. Musculoskeletal: No lower extremity tenderness nor edema.  No joint effusions. Neurologic:  Normal speech and language. No gross focal neurologic deficits are appreciated.  Skin:  Large area of ecchymosis on lower abdomen (right and central).  Some surrounding cellulitis with central 4-cm induration and an open, partially draining purulence eschar 1-cm in diameter.  No surrounding fluctuance.  Otherwise skin is warm, dry and intact.   Of note, I took a photo of the area with the [facility] app, which you can find under Chart Review.  Look at the Media tab.  ____________________________________________   LABS (all labs ordered are listed, but only abnormal results are displayed)  Labs Reviewed - No data to  display ____________________________________________  EKG  Not indicated ____________________________________________  RADIOLOGY   No results found.  ____________________________________________   PROCEDURES  Procedure(s) performed: I&D, see procedure note(s).   INCISION AND DRAINAGE Performed by: Loleta Rose Consent: Verbal consent obtained. Risks and benefits: risks, benefits and alternatives were discussed Type: abscess  Body area: right lower abdomen  Anesthesia: local infiltration  Incision was made with a scalpel.  Local anesthetic: lidocaine 1% without epinephrine  Anesthetic total: 5 ml  Complexity: complex Blunt dissection to break up loculations  Drainage: mostly blood, some purulence  Drainage amount: ~ 5mL  Packing material: none, applied clean gauze outer dressing  Patient tolerance: Patient tolerated the procedure well with no immediate complications.     Critical Care performed: No ____________________________________________   INITIAL IMPRESSION / ASSESSMENT AND PLAN / ED COURSE  Pertinent labs & imaging results that were available during my care of the patient were reviewed by me and considered in my medical decision making (see chart for details).  Unsure about the cause of the extensive ecchymosis and less the patient has been squeezing it so vigorously she has bruised her own skin.  Though there clearly was some drainage from the central area of purulent/eschar, there is still induration and it is not currently draining.  To help make sure that it was adequately drained, I performed an I&D as described above.  Minimal additional purulence was produced, but now will continue to drain.  Given the small size and the most recent evidence based recommendations, I did not pack the wound with iodoform.  I am going to treat with doxycycline given the patient's sulfa allergy making Bactrim not recommended, as well as Keflex for the surrounding  cellulitis.  I recommended that she follow up with her primary care doctor within 2-3 days or return to the emergency department if she gets worse.  (Of note, I took a Building services engineer of the area with the [facility] app, which you can find under Chart Review.  Look at the Media tab.) ____________________________________________  FINAL CLINICAL IMPRESSION(S) / ED DIAGNOSES  Final diagnoses:  Abscess and cellulitis      NEW MEDICATIONS STARTED DURING THIS VISIT:  New Prescriptions   CEPHALEXIN (KEFLEX) 500 MG CAPSULE    Take 1 capsule (500 mg total) by mouth 4 (four) times daily.   DOXYCYCLINE (VIBRAMYCIN) 100 MG CAPSULE    Take 1 capsule (100 mg) by mouth twice daily for 10 days.     Loleta Rose, MD 08/07/15 [phone]

## 2015-08-07 NOTE — Discharge Instructions (Signed)
You have been seen in the Emergency Department (ED) today for an abscess.  This was drained in the ED. ° °Please follow up with your doctor or in the ED in 24-48 hours for recheck of your wound.  Read through the additional discharge instructions included below regarding wound care recommendations.  Keep the wound clean and dry, though you may wash as you would normally.  Change the dressing twice daily. ° °Call your doctor sooner or return to the ED if you develop worsening signs of infection such as: increased redness, increased pain, pus, or fever. ° ° °Abscess °An abscess is an infected area that contains a collection of pus and debris. It can occur in almost any part of the body. An abscess is also known as a furuncle or boil. °CAUSES  °An abscess occurs when tissue gets infected. This can occur from blockage of oil or sweat glands, infection of Cretella follicles, or a minor injury to the skin. As the body tries to fight the infection, pus collects in the area and creates pressure under the skin. This pressure causes pain. People with weakened immune systems have difficulty fighting infections and get certain abscesses more often.  °SYMPTOMS °Usually an abscess develops on the skin and becomes a painful mass that is red, warm, and tender. If the abscess forms under the skin, you may feel a moveable soft area under the skin. Some abscesses break open (rupture) on their own, but most will continue to get worse without care. The infection can spread deeper into the body and eventually into the bloodstream, causing you to feel ill.  °DIAGNOSIS  °Your caregiver will take your medical history and perform a physical exam. A sample of fluid may also be taken from the abscess to determine what is causing your infection. °TREATMENT  °Your caregiver may prescribe antibiotic medicines to fight the infection. However, taking antibiotics alone usually does not cure an abscess. Your caregiver may need to make a small cut  (incision) in the abscess to drain the pus. In some cases, gauze is packed into the abscess to reduce pain and to continue draining the area. °HOME CARE INSTRUCTIONS  °Only take over-the-counter or prescription medicines for pain, discomfort, or fever as directed by your caregiver. °If you were prescribed antibiotics, take them as directed. Finish them even if you start to feel better. °If gauze is used, follow your caregiver's directions for changing the gauze. °To avoid spreading the infection: °Keep your draining abscess covered with a bandage. °Wash your hands well. °Do not share personal care items, towels, or whirlpools with others. °Avoid skin contact with others. °Keep your skin and clothes clean around the abscess. °Keep all follow-up appointments as directed by your caregiver. °SEEK MEDICAL CARE IF:  °You have increased pain, swelling, redness, fluid drainage, or bleeding. °You have muscle aches, chills, or a general ill feeling. °You have a fever. °MAKE SURE YOU:  °Understand these instructions. °Will watch your condition. °Will get help right away if you are not doing well or get worse. °Document Released: 08/26/2005 Document Revised: 05/17/2012 Document Reviewed: 01/29/2012 °ExitCare® Patient Information ©2015 [employer]. This information is not intended to replace advice given to you by your health care provider. Make sure you discuss any questions you have with your health care provider. ° °Abscess °Care After °An abscess (also called a boil or furuncle) is an infected area that contains a collection of pus. Signs and symptoms of an abscess include pain, tenderness, redness, or hardness,   or you may feel a moveable soft area under your skin. An abscess can occur anywhere in the body. The infection may spread to surrounding tissues causing cellulitis. A cut (incision) by the surgeon was made over your abscess and the pus was drained out. Gauze may have been packed into the space to provide a drain  that will allow the cavity to heal from the inside outwards. The boil may be painful for 5 to 7 days. Most people with a boil do not have high fevers. Your abscess, if seen early, may not have localized, and may not have been lanced. If not, another appointment may be required for this if it does not get better on its own or with medications. °HOME CARE INSTRUCTIONS  °Only take over-the-counter or prescription medicines for pain, discomfort, or fever as directed by your caregiver. °When you bathe, soak and then remove gauze or iodoform packs at least daily or as directed by your caregiver. You may then wash the wound gently with mild soapy water. Repack with gauze or do as your caregiver directs. °SEEK IMMEDIATE MEDICAL CARE IF:  °You develop increased pain, swelling, redness, drainage, or bleeding in the wound site. °You develop signs of generalized infection including muscle aches, chills, fever, or a general ill feeling. °An oral temperature above 102° F (38.9° C) develops, not controlled by medication. °See your caregiver for a recheck if you develop any of the symptoms described above. If medications (antibiotics) were prescribed, take them as directed. °Document Released: 06/04/2005 Document Revised: 02/08/2012 Document Reviewed: 01/30/2008 °ExitCare® Patient Information ©2015 [employer]. This information is not intended to replace advice given to you by your health care provider. Make sure you discuss any questions you have with your health care provider. ° °Cellulitis °Cellulitis is an infection of the skin and the tissue beneath it. The infected area is usually red and tender. Cellulitis occurs most often in the arms and lower legs.  °CAUSES  °Cellulitis is caused by bacteria that enter the skin through cracks or cuts in the skin. The most common types of bacteria that cause cellulitis are staphylococci and streptococci. °SIGNS AND SYMPTOMS  °Redness and warmth. °Swelling. °Tenderness or  pain. °Fever. °DIAGNOSIS  °Your health care provider can usually determine what is wrong based on a physical exam. Blood tests may also be done. °TREATMENT  °Treatment usually involves taking an antibiotic medicine. °HOME CARE INSTRUCTIONS  °Take your antibiotic medicine as directed by your health care provider. Finish the antibiotic even if you start to feel better. °Keep the infected arm or leg elevated to reduce swelling. °Apply a warm cloth to the affected area up to 4 times per day to relieve pain. °Take medicines only as directed by your health care provider. °Keep all follow-up visits as directed by your health care provider. °SEEK MEDICAL CARE IF:  °You notice red streaks coming from the infected area. °Your red area gets larger or turns dark in color. °Your bone or joint underneath the infected area becomes painful after the skin has healed. °Your infection returns in the same area or another area. °You notice a swollen bump in the infected area. °You develop new symptoms. °You have a fever. °SEEK IMMEDIATE MEDICAL CARE IF:  °You feel very sleepy. °You develop vomiting or diarrhea. °You have a general ill feeling (malaise) with muscle aches and pains. °MAKE SURE YOU:  °Understand these instructions. °Will watch your condition. °Will get help right away if you are not doing well or get   worse. °Document Released: 08/26/2005 Document Revised: 04/02/2014 Document Reviewed: 02/01/2012 °ExitCare® Patient Information ©2015 [employer]. This information is not intended to replace advice given to you by your health care provider. Make sure you discuss any questions you have with your health care provider. ° ° ° °

## 2015-12-16 ENCOUNTER — Emergency Department
Admission: EM | Admit: 2015-12-16 | Discharge: 2015-12-16 | Disposition: A | Discharge status: Home / Self Care | Payer: Medicare HMO | Attending: Emergency Medicine | Admitting: Emergency Medicine

## 2015-12-16 ENCOUNTER — Encounter: Payer: Self-pay | Admitting: Emergency Medicine

## 2015-12-16 ENCOUNTER — Emergency Department: Payer: Medicare HMO

## 2015-12-16 DIAGNOSIS — Z792 Long term (current) use of antibiotics: Secondary | ICD-10-CM | POA: Insufficient documentation

## 2015-12-16 DIAGNOSIS — N12 Tubulo-interstitial nephritis, not specified as acute or chronic: Secondary | ICD-10-CM | POA: Insufficient documentation

## 2015-12-16 DIAGNOSIS — Z3202 Encounter for pregnancy test, result negative: Secondary | ICD-10-CM | POA: Diagnosis not present

## 2015-12-16 DIAGNOSIS — R109 Unspecified abdominal pain: Secondary | ICD-10-CM

## 2015-12-16 HISTORY — DX: Calculus of kidney: N20.0

## 2015-12-16 LAB — LIPASE, BLOOD: Lipase: 23 U/L (ref 11–51)

## 2015-12-16 LAB — COMPREHENSIVE METABOLIC PANEL
ALK PHOS: 72 U/L (ref 38–126)
ALT: 13 U/L — ABNORMAL LOW (ref 14–54)
ANION GAP: 7 (ref 5–15)
AST: 17 U/L (ref 15–41)
Albumin: 4 g/dL (ref 3.5–5.0)
BUN: 11 mg/dL (ref 6–20)
CALCIUM: 9.1 mg/dL (ref 8.9–10.3)
CO2: 21 mmol/L — AB (ref 22–32)
Chloride: 108 mmol/L (ref 101–111)
Creatinine, Ser: 0.91 mg/dL (ref 0.44–1.00)
GFR calc Af Amer: >60 mL/min (ref >60)
GFR calc non Af Amer: >60 mL/min (ref >60)
GLUCOSE: 107 mg/dL — AB (ref 65–99)
POTASSIUM: 3.6 mmol/L (ref 3.5–5.1)
SODIUM: 136 mmol/L (ref 135–145)
Total Bilirubin: 1 mg/dL (ref 0.3–1.2)
Total Protein: 7.2 g/dL (ref 6.5–8.1)

## 2015-12-16 LAB — URINALYSIS COMPLETE WITH MICROSCOPIC (ARMC ONLY)
BILIRUBIN URINE: NEGATIVE
Bacteria, UA: NONE SEEN
GLUCOSE, UA: NEGATIVE mg/dL
KETONES UR: NEGATIVE mg/dL
NITRITE: NEGATIVE
Protein, ur: NEGATIVE mg/dL
SPECIFIC GRAVITY, URINE: 1.01 (ref 1.005–1.030)
pH: 6 (ref 5.0–8.0)

## 2015-12-16 LAB — CBC
HCT: 41 % (ref 35.0–47.0)
Hemoglobin: 13.7 g/dL (ref 12.0–16.0)
MCH: 26.7 pg (ref 26.0–34.0)
MCHC: 33.3 g/dL (ref 32.0–36.0)
MCV: 80.2 fL (ref 80.0–100.0)
Platelets: 276 10*3/uL (ref 150–440)
RBC: 5.12 MIL/uL (ref 3.80–5.20)
RDW: 15.2 % — AB (ref 11.5–14.5)
WBC: 18.7 10*3/uL — AB (ref 3.6–11.0)

## 2015-12-16 LAB — POCT PREGNANCY, URINE: PREG TEST UR: NEGATIVE

## 2015-12-16 MED ORDER — ONDANSETRON 4 MG PO TBDP: 4.0000 mg | ORAL_TABLET | Freq: Three times a day (TID) | ORAL | Status: DC | PRN

## 2015-12-16 MED ORDER — LEVOFLOXACIN 750 MG PO TABS: 750.0000 mg | ORAL_TABLET | Freq: Every day | ORAL | Status: AC

## 2015-12-16 MED ORDER — OXYCODONE-ACETAMINOPHEN 5-325 MG PO TABS: 1.0000 | ORAL_TABLET | Freq: Four times a day (QID) | ORAL | Status: DC | PRN

## 2015-12-16 MED ORDER — LEVOFLOXACIN IN D5W 750 MG/150ML IV SOLN
750.0000 mg | Freq: Once | INTRAVENOUS | Status: AC
  Administered 2015-12-16: 750 mg via INTRAVENOUS
  Filled 2015-12-16: qty 150

## 2015-12-16 MED ORDER — KETOROLAC TROMETHAMINE 30 MG/ML IJ SOLN
INTRAMUSCULAR | Status: AC
  Filled 2015-12-16: qty 1

## 2015-12-16 MED ORDER — MORPHINE SULFATE (PF) 4 MG/ML IV SOLN
4.0000 mg | Freq: Once | INTRAVENOUS | Status: AC
  Administered 2015-12-16: 4 mg via INTRAVENOUS
  Filled 2015-12-16: qty 1

## 2015-12-16 MED ORDER — KETOROLAC TROMETHAMINE 30 MG/ML IJ SOLN
30.0000 mg | Freq: Once | INTRAMUSCULAR | Status: AC
  Administered 2015-12-16: 30 mg via INTRAVENOUS

## 2015-12-16 NOTE — ED Provider Notes (Signed)
-----------------------------------------   8:11 AM on 12/16/2015 -----------------------------------------   Blood pressure 114/65, pulse 95, temperature 98.2 F (36.8 C), temperature source Oral, resp. rate 14, last menstrual period 12/02/2015, SpO2 100 %.  Assuming care from Dr. Alphonzo LemmingsMcShane.  In short, Brittney Sanders is a 30 y.o. female with a chief complaint of Flank Pain .  Refer to the original H&P for additional details.  The current plan of care is to follow the patient's pain control. She feels improved and reexamination of her abdomen shows no peritoneal signs. Patient has plans for discharge with written prescriptions for Percocet, Zofran, and Cipro. Patient I felt was improved enough for discharge at this time.   Jennye MoccasinBrian S Quigley, MD 12/16/15 [phone]

## 2015-12-16 NOTE — ED Notes (Signed)
Patient ambulatory to triage with steady gait, without difficulty or distress noted; pt reports awakening at 2am with left flank/side pain accomp by nausea; st hx kidney stones

## 2015-12-16 NOTE — ED Notes (Signed)
Report to stephanie, rn

## 2015-12-16 NOTE — ED Notes (Signed)
Pt provided ginger ale for PO challenge  

## 2015-12-16 NOTE — ED Notes (Signed)
Pt awoken from sound sleep to administer toradol. Pt continues to report pain 10/10 after awoken.

## 2015-12-16 NOTE — ED Notes (Signed)
Patient transported to CT 

## 2015-12-16 NOTE — ED Provider Notes (Addendum)
[facility] Emergency Department Provider Note  ____________________________________________   I have reviewed the triage vital signs and the nursing notes.   HISTORY  Chief Complaint Flank Pain    HPI Brittney Sanders is a 30 y.o. female suffers from morbid obesity and kidney stones presents with sudden onset left flank pain. No vomiting. No fever. No dysuria. Did have some nausea. Pain began suddenly around 2:00. Feels like a kidney stone.Not have any dysuria or urinary frequency.  Past Medical History  Diagnosis Date  . Seasonal allergies   . Kidney stone     There are no active problems to display for this patient.   Past Surgical History  Procedure Laterality Date  . Tympanostomy tube placement      Current Outpatient Rx  Name  Route  Sig  Dispense  Refill  . Alum & Mag Hydroxide-Simeth (MAGIC MOUTHWASH) SOLN   Oral   Take 10 mLs by mouth 3 (three) times daily as needed (throat pain).   30 mL   0   . amoxicillin (AMOXIL) 500 MG capsule   Oral   Take 1 capsule (500 mg total) by mouth 3 (three) times daily.   21 capsule   0   . cephALEXin (KEFLEX) 500 MG capsule   Oral   Take 1 capsule (500 mg total) by mouth 4 (four) times daily.   40 capsule   0   . doxycycline (VIBRAMYCIN) 100 MG capsule      Take 1 capsule (100 mg) by mouth twice daily for 10 days.   20 capsule   0     Allergies Sulfa antibiotics  No family history on file.  Social History Social History  Substance Use Topics  . Smoking status: Never Smoker   . Smokeless tobacco: None  . Alcohol Use: No    Review of Systems Constitutional: No fever/chills Eyes: No visual changes. ENT: No sore throat. No stiff neck no neck pain Cardiovascular: Denies chest pain. Respiratory: Denies shortness of breath. Gastrointestinal:   no vomiting.  No diarrhea.  No constipation. Genitourinary: Negative for dysuria. Musculoskeletal: Negative lower extremity swelling Skin:  Negative for rash. Neurological: Negative for headaches, focal weakness or numbness. 10-point ROS otherwise negative.  ____________________________________________   PHYSICAL EXAM:  VITAL SIGNS: ED Triage Vitals  Enc Vitals Group     BP 12/16/15 0552 118/72 mmHg     Pulse Rate 12/16/15 0552 98     Resp 12/16/15 0552 14     Temp 12/16/15 0552 98.2 F (36.8 C)     Temp Source 12/16/15 0552 Oral     SpO2 12/16/15 0552 97 %     Weight --      Height --      Head Cir --      Peak Flow --      Pain Score 12/16/15 0524 10     Pain Loc --      Pain Edu? --      Excl. in GC? --     Constitutional: Alert and oriented. Well appearing and in no acute distress. The pain went to the room Eyes: Conjunctivae are normal. PERRL. EOMI. Head: Atraumatic. Nose: No congestion/rhinnorhea. Mouth/Throat: Mucous membranes are moist.  Oropharynx non-erythematous. Neck: No stridor.   Nontender with no meningismus Cardiovascular: Normal rate, regular rhythm. Grossly normal heart sounds.  Good peripheral circulation. Respiratory: Normal respiratory effort.  No retractions. Lungs CTAB. Abdominal: Soft and nontender. No distention. No guarding no rebound Back:  There is no focal tenderness or step off there is no midline tenderness there are no lesions noted. there is mild left CVA tenderness Musculoskeletal: No lower extremity tenderness. No joint effusions, no DVT signs strong distal pulses no edema Neurologic:  Normal speech and language. No gross focal neurologic deficits are appreciated.  Skin:  Skin is warm, dry and intact. No rash noted. Psychiatric: Mood and affect are normal. Speech and behavior are normal.  ____________________________________________   LABS (all labs ordered are listed, but only abnormal results are displayed)  Labs Reviewed  CBC - Abnormal; Notable for the following:    WBC 18.7 (*)    RDW 15.2 (*)    All other components within normal limits  URINALYSIS  COMPLETEWITH MICROSCOPIC ([facility] ONLY)  LIPASE, BLOOD  COMPREHENSIVE METABOLIC PANEL  POC URINE PREG, ED  POCT PREGNANCY, URINE   ____________________________________________  EKG  I personally interpreted any EKGs ordered by me or triage  ____________________________________________  RADIOLOGY  I reviewed any imaging ordered by me or triage that were performed during my shift ____________________________________________   PROCEDURES  Procedure(s) performed: None  Critical Care performed: None  ____________________________________________   INITIAL IMPRESSION / ASSESSMENT AND PLAN / ED COURSE  Pertinent labs & imaging results that were available during my care of the patient were reviewed by me and considered in my medical decision making (see chart for details).  Patient with reproducible left flank pain which began suddenly in the middle the night with a history of kidney stones. White count is elevated but he certainly could just be due to stress from the pain. This time she is much better pain control but apparently the pain was quite significant before. We'll give her Toradol, and we will await further information from blood work. Patient is nontoxic and well appearing and actually sleeping when I entered.   ----------------------------------------- 7:17 AM on 12/16/2015 -----------------------------------------    Giving abx. No vomiting here. Pt appears by labs to have pyelonephritis.  Does now admit to chills.   ----------------------------------------- 7:32 AM on 12/16/2015 -----------------------------------------  No evidence of infected kidney stone patient feels better we are giving her morphine and she still has some residual pain no vomiting no indication for acute admission to the hospital did make her aware of all CT findings and the possibility of reflux. Referred her back to her primary urologist. Patient will follow closely with them and return if she  has any new or worrisome symptoms. ____________________________________________   FINAL CLINICAL IMPRESSION(S) / ED DIAGNOSES  Final diagnoses:  None     Jeanmarie Plant, MD 12/16/15 1610  Jeanmarie Plant, MD 12/16/15 [id number]  Jeanmarie Plant, MD 12/16/15 [phone]

## 2015-12-16 NOTE — Discharge Instructions (Signed)
If you have high fever, vomiting, increased pain not treated by the pain medications we have given you or you feel worse in any way, return to the Emergence Department. We strongly advise you to follow up this week with your urologist, as there is some possibilities you have reflux into your kidneys.

## 2015-12-17 ENCOUNTER — Encounter: Payer: Self-pay | Admitting: *Deleted

## 2015-12-17 ENCOUNTER — Ambulatory Visit (INDEPENDENT_AMBULATORY_CARE_PROVIDER_SITE_OTHER): Payer: Medicare HMO | Admitting: Urology

## 2015-12-17 VITALS — BP 103/67 | HR 94 | Ht 63.0 in | Wt 251.2 lb

## 2015-12-17 DIAGNOSIS — N2889 Other specified disorders of kidney and ureter: Secondary | ICD-10-CM | POA: Diagnosis not present

## 2015-12-17 DIAGNOSIS — N12 Tubulo-interstitial nephritis, not specified as acute or chronic: Secondary | ICD-10-CM | POA: Diagnosis not present

## 2015-12-17 DIAGNOSIS — N200 Calculus of kidney: Secondary | ICD-10-CM

## 2015-12-17 LAB — URINALYSIS, COMPLETE
BILIRUBIN UA: NEGATIVE
GLUCOSE, UA: NEGATIVE
Ketones, UA: NEGATIVE
Nitrite, UA: NEGATIVE
Specific Gravity, UA: 1.015 (ref 1.005–1.030)
UUROB: 2 mg/dL — AB (ref 0.2–1.0)
pH, UA: 8.5 — ABNORMAL HIGH (ref 5.0–7.5)

## 2015-12-17 LAB — MICROSCOPIC EXAMINATION

## 2015-12-17 NOTE — Progress Notes (Signed)
12/17/2015 1:06 PM   Brittney Sanders 1986-05-13 [id number]  Referring provider: Geryl Rankins, MD [address] [place], [place] [address]  Chief Complaint  Patient presents with  . Nephrolithiasis    seen in ed    HPI: Patient is 30 year old Caucasian female with a history of nephrolithiasis who is seen at [facility] emergency department and diagnosed with pyelonephritis and instructed to follow-up with her urologist.  Patient states that yesterday in the early morning she was awoken with the sudden onset of left-sided flank pain. It radiated to the left waist.  She stated she waited for 2 hours to see if the pain would abate, but it did not. The pain continued to worsen and she went to the emergency department for further evaluation and management of the pain.  She states she did not have a fever, chills, nausea or vomiting.  She is found to be afebrile in the emergency department, but she states she developed chills while she was being seen in the ER.   Her white blood cell count was 18.7 and her UA was suspicious for infection in the emergency department. It has been sent for culture, but the results are still pending at this time.  CT Renal Stone Study noted left perinephric stranding without hydronephrosis or ureteral calculus.  Left nephrolithiasis.  Left renal scarring worst at the poles, question reflux nephropathy.  I have reviewed the films with the patient.    Today, she states she still having left-sided flank pain. She is taking the Levaquin 500 mg daily as prescribed. She is using oxycodone/APAP for the pain. She has not had any gross hematuria.  She is not experiencing dysuria, fever, chills, nausea or vomiting at this time.  Her UA is unremarkable at this time.  Patient was seen two years ago for a left ureteral stone.  She has a remaining stone in her left kidney with renal scarring and was to follow up with [place], but she did not  keep her appointment.   PMH: Past Medical History  Diagnosis Date  . Seasonal allergies   . Kidney stone   . Plantar fasciitis   . Depression   . Eczema   . Heel spur   . UTI (lower urinary tract infection)   . Obesity   . Hearing deficit   . Left nephrolithiasis   . Nocturia   . Overweight     Surgical History: Past Surgical History  Procedure Laterality Date  . Tympanostomy tube placement Right   . Tubal ligation      Home Medications:    Medication List       This list is accurate as of: 12/17/15  1:06 PM.  Always use your most recent med list.               amoxicillin 500 MG capsule  Commonly known as:  AMOXIL  Take 1 capsule (500 mg total) by mouth 3 (three) times daily.     azelastine 0.05 % ophthalmic solution  Commonly known as:  OPTIVAR     cephALEXin 500 MG capsule  Commonly known as:  KEFLEX  Take 1 capsule (500 mg total) by mouth 4 (four) times daily.     cetirizine 10 MG tablet  Commonly known as:  ZYRTEC  Take 10 mg by mouth daily.     doxycycline 100 MG capsule  Commonly known as:  VIBRAMYCIN  Take 1 capsule (100 mg) by mouth  twice daily for 10 days.     levofloxacin 750 MG tablet  Commonly known as:  LEVAQUIN  Take 1 tablet (750 mg total) by mouth daily.     Loratadine 10 MG Caps     magic mouthwash Soln  Take 10 mLs by mouth 3 (three) times daily as needed (throat pain).     ondansetron 4 MG disintegrating tablet  Commonly known as:  ZOFRAN ODT  Take 1 tablet (4 mg total) by mouth every 8 (eight) hours as needed for nausea or vomiting.     oxyCODONE-acetaminophen 5-325 MG tablet  Commonly known as:  ROXICET  Take 1 tablet by mouth every 6 (six) hours as needed.     PARoxetine 40 MG tablet  Commonly known as:  PAXIL  Take 1 tablet by mouth daily.     phentermine 37.5 MG tablet  Commonly known as:  ADIPEX-P  Take 1 tablet by mouth daily.     TOPAMAX SPRINKLE 25 MG capsule  Generic drug:  topiramate  Take 25 mg by mouth  daily.     VENTOLIN HFA 108 (90 Base) MCG/ACT inhaler  Generic drug:  albuterol        Allergies:  Allergies  Allergen Reactions  . Sulfa Antibiotics Hives and Rash    Family History: Family History  Problem Relation Age of Onset  . Urolithiasis Maternal Grandmother   . Kidney disease Neg Hx   . Bladder Cancer Neg Hx     Social History:  reports that she has never smoked. She does not have any smokeless tobacco history on file. She reports that she does not drink alcohol or use illicit drugs.  ROS: UROLOGY Frequent Urination?: No Hard to postpone urination?: No Burning/pain with urination?: No Get up at night to urinate?: No Leakage of urine?: No Urine stream starts and stops?: No Trouble starting stream?: No Do you have to strain to urinate?: No Blood in urine?: No Urinary tract infection?: No Sexually transmitted disease?: No Injury to kidneys or bladder?: No Painful intercourse?: No Weak stream?: No Currently pregnant?: No Vaginal bleeding?: No Last menstrual period?: n  Gastrointestinal Nausea?: No Vomiting?: No Indigestion/heartburn?: No Diarrhea?: No Constipation?: No  Constitutional Fever: No Night sweats?: No Weight loss?: No Fatigue?: No  Skin Skin rash/lesions?: No Itching?: No  Eyes Blurred vision?: No Double vision?: No  Ears/Nose/Throat Sore throat?: No Sinus problems?: No  Hematologic/Lymphatic Swollen glands?: No Easy bruising?: No  Cardiovascular Leg swelling?: No Chest pain?: No  Respiratory Cough?: No Shortness of breath?: No  Endocrine Excessive thirst?: No  Musculoskeletal Back pain?: No Joint pain?: No  Neurological Headaches?: No Dizziness?: No  Psychologic Depression?: No Anxiety?: No  Physical Exam: BP 103/67 mmHg  Pulse 94  Ht  (1.6 m)  Wt 251 lb 3.2 oz (113.944 kg)  BMI 44.51 kg/m2  LMP 12/02/2015 (Exact Date)  Constitutional: Well nourished. Alert and oriented, No acute  distress. HEENT: [place] AT, moist mucus membranes. Trachea midline, no masses. Cardiovascular: No clubbing, cyanosis, or edema. Respiratory: Normal respiratory effort, no increased work of breathing. GI: Abdomen is soft, non tender, non distended, no abdominal masses. Liver and spleen not palpable.  No hernias appreciated.  Stool sample for occult testing is not indicated.   GU: No CVA tenderness.  No bladder fullness or masses.   Skin: No rashes, bruises or suspicious lesions. Lymph: No cervical or inguinal adenopathy. Neurologic: Grossly intact, no focal deficits, moving all 4 extremities. Psychiatric: Normal mood and affect.  Laboratory Data: Lab Results  Component Value Date   WBC 18.7* 12/16/2015   HGB 13.7 12/16/2015   HCT 41.0 12/16/2015   MCV 80.2 12/16/2015   PLT 276 12/16/2015    Lab Results  Component Value Date   CREATININE 0.91 12/16/2015   Lab Results  Component Value Date   AST 17 12/16/2015   Lab Results  Component Value Date   ALT 13* 12/16/2015   Urinalysis    Component Value Date/Time   COLORURINE STRAW* 12/16/2015 0526   COLORURINE Yellow 08/09/2014 1015   APPEARANCEUR CLEAR* 12/16/2015 0526   APPEARANCEUR Cloudy 08/09/2014 1015   LABSPEC 1.010 12/16/2015 0526   LABSPEC 1.015 08/09/2014 1015   PHURINE 6.0 12/16/2015 0526   PHURINE 7.0 08/09/2014 1015   GLUCOSEU NEGATIVE 12/16/2015 0526   GLUCOSEU Negative 08/09/2014 1015   HGBUR 1+* 12/16/2015 0526   HGBUR 2+ 08/09/2014 1015   BILIRUBINUR NEGATIVE 12/16/2015 0526   BILIRUBINUR Negative 08/09/2014 1015   KETONESUR NEGATIVE 12/16/2015 0526   KETONESUR Negative 08/09/2014 1015   PROTEINUR NEGATIVE 12/16/2015 0526   PROTEINUR Negative 08/09/2014 1015   NITRITE NEGATIVE 12/16/2015 0526   NITRITE Negative 08/09/2014 1015   LEUKOCYTESUR 1+* 12/16/2015 0526   LEUKOCYTESUR 3+ 08/09/2014 1015   Results for orders placed or performed in visit on 12/17/15  Microscopic Examination  Result Value Ref  Range   WBC, UA 0-5 0 -  5 /hpf   RBC, UA 0-2 0 -  2 /hpf   Epithelial Cells (non renal) 0-10 0 - 10 /hpf   Mucus, UA Present (A) Not Estab.   Bacteria, UA Few (A) None seen/Few  Urinalysis, Complete  Result Value Ref Range   Specific Gravity, UA 1.015 1.005 - 1.030   pH, UA 8.5 (H) 5.0 - 7.5   Color, UA Yellow Yellow   Appearance Ur Clear Clear   Leukocytes, UA Trace (A) Negative   Protein, UA 1+ (A) Negative/Trace   Glucose, UA Negative Negative   Ketones, UA Negative Negative   RBC, UA Trace (A) Negative   Bilirubin, UA Negative Negative   Urobilinogen, Ur 2.0 (H) 0.2 - 1.0 mg/dL   Nitrite, UA Negative Negative   Microscopic Examination See below:    Pertinent Imaging: CLINICAL DATA: Awoke with left flank pain and nausea.  EXAM: CT ABDOMEN AND PELVIS WITHOUT CONTRAST  TECHNIQUE: Multidetector CT imaging of the abdomen and pelvis was performed following the standard protocol without IV contrast.  COMPARISON: 08/09/2014  FINDINGS: Lower chest and abdominal wall: No contributory findings.  Hepatobiliary: No focal liver abnormality.No evidence of biliary obstruction or stone.  Pancreas: Unremarkable.  Spleen: Unremarkable.  Adrenals/Urinary Tract: Negative adrenals. 4 mm stone in the lower pole left kidney, nonobstructive. Multi focal left renal cortical scarring with stable pattern since prior. Asymmetric left perinephric stranding. Given preference for polar involvement, question chronic reflux nephropathy. Unremarkable bladder.  Reproductive:No pathologic findings.  Stomach/Bowel: No obstruction. No appendicitis.  Vascular/Lymphatic: No acute vascular abnormality. No mass or adenopathy.  Peritoneal: No ascites or pneumoperitoneum.  Musculoskeletal: Premature L5-S1 disc narrowing and endplate spurring.  IMPRESSION: 1. Left perinephric stranding without hydronephrosis or ureteral calculus, correlate for signs of pyelonephritis. 2.  Left nephrolithiasis. 3. Left renal scarring worst at the poles, question reflux nephropathy.   Electronically Signed  By: Marnee Spring M.D.  On: 12/16/2015 07:15        Assessment & Plan:    1. Pyelonephritis:   Patient is currently on Levaquin.  Urine culture results from  the ED visit on 12/16/2015 are still pending.  We will adjust the antibiotic if appropriate.  Her UA today is unremarkable.  She is to contact our office or seek treatment in the ED if she develops high fevers or chills, intractable pain or vomiting or gross hematuria.    - Urinalysis, Complete - CULTURE, URINE COMPREHENSIVE   2. Renal scarring:   Question of possible reflux on CT Renal Stone study.  Once her pyelonephritis has resolved, we will pursue a VCUG.    3. Renal stone:   Patient has a left lower pole stone that has remained unchanged in size and position for the last two years.  We will continue to monitor with yearly KUB's.    Return for pending labs.  These notes generated with voice recognition software. I apologize for typographical errors.  Michiel Cowboy, PA-C  [facility] [address] [place], [place] [address] [phone]

## 2015-12-18 LAB — URINE CULTURE

## 2015-12-19 ENCOUNTER — Other Ambulatory Visit: Payer: Self-pay | Admitting: Urology

## 2015-12-19 DIAGNOSIS — N1339 Other hydronephrosis: Secondary | ICD-10-CM

## 2015-12-19 LAB — CULTURE, URINE COMPREHENSIVE

## 2015-12-20 ENCOUNTER — Telehealth: Payer: Self-pay

## 2015-12-20 NOTE — Telephone Encounter (Signed)
LMOM-recent labs negative. Needs to schedule procedure. Do you schedule these michelle?

## 2015-12-20 NOTE — Telephone Encounter (Signed)
This is schd for 12-25-15  michelle

## 2015-12-20 NOTE — Telephone Encounter (Signed)
-----   Message from Harle Battiest, PA-C sent at 12/19/2015  3:58 PM EST ----- Patient's urine culture is negative.  We need to schedule the VCUG.  Orders are in the system.

## 2015-12-25 ENCOUNTER — Ambulatory Visit
Admission: RE | Admit: 2015-12-25 | Discharge: 2015-12-25 | Disposition: A | Discharge status: Home / Self Care | Payer: Medicare HMO | Source: Ambulatory Visit | Attending: Urology | Admitting: Urology

## 2015-12-25 DIAGNOSIS — N1370 Vesicoureteral-reflux, unspecified: Secondary | ICD-10-CM | POA: Diagnosis not present

## 2015-12-25 DIAGNOSIS — N1339 Other hydronephrosis: Secondary | ICD-10-CM

## 2015-12-26 ENCOUNTER — Telehealth: Payer: Self-pay

## 2015-12-26 NOTE — Telephone Encounter (Signed)
LMOM

## 2015-12-26 NOTE — Telephone Encounter (Signed)
Pt called asking does she need another round of abx. Pt stated she received abx from the ER for a kidney infection. Reinforced with pt ucx from 12/20/15 was negative. Pt requested results of VCUG from yesterday. Please advise.

## 2015-12-26 NOTE — Telephone Encounter (Signed)
She does have reflux.  She does not need an antibiotic at this time.  I would like her to have an appointment with one of physicians.

## 2015-12-26 NOTE — Telephone Encounter (Signed)
See note below

## 2015-12-27 NOTE — Telephone Encounter (Signed)
Pt returned call. Made aware of VCUG results. Pt was transferred to the front to make a f/u appt with an MD.

## 2015-12-30 ENCOUNTER — Ambulatory Visit (INDEPENDENT_AMBULATORY_CARE_PROVIDER_SITE_OTHER): Payer: Medicare HMO | Admitting: Urology

## 2015-12-30 ENCOUNTER — Encounter: Payer: Self-pay | Admitting: Urology

## 2015-12-30 VITALS — BP 124/81 | HR 68 | Ht 65.0 in | Wt 248.7 lb

## 2015-12-30 DIAGNOSIS — N1370 Vesicoureteral-reflux, unspecified: Secondary | ICD-10-CM | POA: Diagnosis not present

## 2015-12-30 DIAGNOSIS — H9190 Unspecified hearing loss, unspecified ear: Secondary | ICD-10-CM | POA: Insufficient documentation

## 2015-12-30 DIAGNOSIS — E669 Obesity, unspecified: Secondary | ICD-10-CM | POA: Insufficient documentation

## 2015-12-30 NOTE — Progress Notes (Signed)
The patient is seen today in follow-up after undergoing a voiding cystometric  Urogram (VCUG).   The patient underwent a VCUG to evaluate for reflux of her ureter. She has recently seen and admitted to the hospital for left pyelonephritis. The kidney was noted to be slightly atrophied with some parenchymal scarring. There was associated perinephric stranding. The patient states that she gets urinary tract infections about once a year. These seem to turn into kidney infections.   The patient has grade 1 left ureteral reflux. There is no evidence of right ureteral reflux.   The patient denies any progressive voiding symptoms, constipation, or neurological changes.  Current Outpatient Prescriptions on File Prior to Visit  Medication Sig Dispense Refill  . azelastine (OPTIVAR) 0.05 % ophthalmic solution     . cetirizine (ZYRTEC) 10 MG tablet Take 10 mg by mouth daily.    . Loratadine 10 MG CAPS     . PARoxetine (PAXIL) 40 MG tablet Take 1 tablet by mouth daily.    . phentermine (ADIPEX-P) 37.5 MG tablet Take 1 tablet by mouth daily.    Marland Kitchen topiramate (TOPAMAX SPRINKLE) 25 MG capsule Take 25 mg by mouth daily.    . VENTOLIN HFA 108 (90 Base) MCG/ACT inhaler      No current facility-administered medications on file prior to visit.   Past Medical History  Diagnosis Date  . Seasonal allergies   . Kidney stone   . Plantar fasciitis   . Depression   . Eczema   . Heel spur   . UTI (lower urinary tract infection)   . Obesity   . Hearing deficit   . Left nephrolithiasis   . Nocturia   . Overweight    The patient has a family history of Social History  Substance Use Topics  . Smoking status: Never Smoker   . Smokeless tobacco: None  . Alcohol Use: No   PE: Filed Vitals:   12/30/15 1146  BP: 124/81  Pulse: 68   NAD Abdomen is soft   The patient's urine analysis is unavailable for evaluation.   I've independently reviewed the patient's VCUG -this demonstrated a grade 1 left ureteral  reflux  Impression: the patient has left ureteral reflux , mild in severity,  With minor clinical implications. In addition, the patient has a small left lower pole  Renal stone, this is stable.   Recommendations: I discussed the patient's VCUG results with her and her husband in detail. At this point, there is no indication for any more invasive interventions including surgery. Further, the patient only gets infections on a yearly basis and suppressive antibiotics would also not be indicated as such. I reassured the patient that her kidney was not at risk and that she needed to be careful  When he developed urinary tract infections to seek medical attention in a timely fashion prior to it ascending up to her kidney and causing a kidney infection. We can keep an eye on her reflux as well as her stone with ultrasounds, we'll plan to get an ultrasound again in 1 year.  CC: please send a copy of this to the patient's primary care provider.

## 2016-04-06 ENCOUNTER — Emergency Department
Admission: EM | Admit: 2016-04-06 | Discharge: 2016-04-06 | Disposition: A | Discharge status: Home / Self Care | Payer: Medicare HMO | Attending: Emergency Medicine | Admitting: Emergency Medicine

## 2016-04-06 DIAGNOSIS — E669 Obesity, unspecified: Secondary | ICD-10-CM | POA: Insufficient documentation

## 2016-04-06 DIAGNOSIS — R1030 Lower abdominal pain, unspecified: Secondary | ICD-10-CM

## 2016-04-06 DIAGNOSIS — Z79899 Other long term (current) drug therapy: Secondary | ICD-10-CM | POA: Diagnosis not present

## 2016-04-06 DIAGNOSIS — F329 Major depressive disorder, single episode, unspecified: Secondary | ICD-10-CM | POA: Diagnosis not present

## 2016-04-06 DIAGNOSIS — R110 Nausea: Secondary | ICD-10-CM

## 2016-04-06 LAB — URINALYSIS COMPLETE WITH MICROSCOPIC (ARMC ONLY)
BACTERIA UA: NONE SEEN
BILIRUBIN URINE: NEGATIVE
Glucose, UA: NEGATIVE mg/dL
Hgb urine dipstick: NEGATIVE
KETONES UR: NEGATIVE mg/dL
LEUKOCYTES UA: NEGATIVE
NITRITE: NEGATIVE
PH: 7 (ref 5.0–8.0)
PROTEIN: NEGATIVE mg/dL
SPECIFIC GRAVITY, URINE: 1.013 (ref 1.005–1.030)

## 2016-04-06 LAB — CBC
HCT: 42.7 % (ref 35.0–47.0)
Hemoglobin: 13.9 g/dL (ref 12.0–16.0)
MCH: 27.3 pg (ref 26.0–34.0)
MCHC: 32.6 g/dL (ref 32.0–36.0)
MCV: 83.7 fL (ref 80.0–100.0)
Platelets: 307 10*3/uL (ref 150–440)
RBC: 5.1 MIL/uL (ref 3.80–5.20)
RDW: 14.8 % — AB (ref 11.5–14.5)
WBC: 14 10*3/uL — ABNORMAL HIGH (ref 3.6–11.0)

## 2016-04-06 LAB — WET PREP, GENITAL
Clue Cells Wet Prep HPF POC: NONE SEEN
SPERM: NONE SEEN
TRICH WET PREP: NONE SEEN
Yeast Wet Prep HPF POC: NONE SEEN

## 2016-04-06 LAB — COMPREHENSIVE METABOLIC PANEL
ALK PHOS: 64 U/L (ref 38–126)
ALT: 18 U/L (ref 14–54)
ANION GAP: 9 (ref 5–15)
AST: 24 U/L (ref 15–41)
Albumin: 4.3 g/dL (ref 3.5–5.0)
BILIRUBIN TOTAL: 0.5 mg/dL (ref 0.3–1.2)
BUN: 11 mg/dL (ref 6–20)
CALCIUM: 9.4 mg/dL (ref 8.9–10.3)
CO2: 28 mmol/L (ref 22–32)
Chloride: 100 mmol/L — ABNORMAL LOW (ref 101–111)
Creatinine, Ser: 0.9 mg/dL (ref 0.44–1.00)
GFR calc non Af Amer: >60 mL/min (ref >60)
Glucose, Bld: 83 mg/dL (ref 65–99)
Potassium: 3.9 mmol/L (ref 3.5–5.1)
SODIUM: 137 mmol/L (ref 135–145)
TOTAL PROTEIN: 7.4 g/dL (ref 6.5–8.1)

## 2016-04-06 LAB — CHLAMYDIA/NGC RT PCR (ARMC ONLY)
Chlamydia Tr: NOT DETECTED
N gonorrhoeae: NOT DETECTED

## 2016-04-06 LAB — POCT PREGNANCY, URINE: Preg Test, Ur: NEGATIVE

## 2016-04-06 MED ORDER — ONDANSETRON 4 MG PO TBDP: 4.0000 mg | ORAL_TABLET | Freq: Three times a day (TID) | ORAL | Status: DC | PRN

## 2016-04-06 MED ORDER — TRAMADOL HCL 50 MG PO TABS
ORAL_TABLET | ORAL | Status: AC
  Administered 2016-04-06: 50 mg via ORAL
  Filled 2016-04-06: qty 1

## 2016-04-06 MED ORDER — ONDANSETRON 4 MG PO TBDP
4.0000 mg | ORAL_TABLET | Freq: Once | ORAL | Status: AC
  Administered 2016-04-06: 4 mg via ORAL

## 2016-04-06 MED ORDER — ONDANSETRON 4 MG PO TBDP
ORAL_TABLET | ORAL | Status: AC
  Administered 2016-04-06: 4 mg via ORAL
  Filled 2016-04-06: qty 1

## 2016-04-06 MED ORDER — TRAMADOL HCL 50 MG PO TABS
50.0000 mg | ORAL_TABLET | Freq: Once | ORAL | Status: AC
  Administered 2016-04-06: 50 mg via ORAL

## 2016-04-06 NOTE — ED Notes (Signed)
Pt. Going home with husband. 

## 2016-04-06 NOTE — Discharge Instructions (Signed)
You have been seen in the emergency department for lower abdominal discomfort and nausea. Her workup has not shown a clear cause for discomfort. As we discussed please follow-up with a GYN as soon as possible for a Pap smear. Please take nausea medication as needed, as prescribed. Return to the emergency department for any worsening pain, fever, or any other symptom personally concerning to your self.   Abdominal Pain, Adult Many things can cause abdominal pain. Usually, abdominal pain is not caused by a disease and will improve without treatment. It can often be observed and treated at home. Your health care provider will do a physical exam and possibly order blood tests and X-rays to help determine the seriousness of your pain. However, in many cases, more time must pass before a clear cause of the pain can be found. Before that point, your health care provider may not know if you need more testing or further treatment. HOME CARE INSTRUCTIONS Monitor your abdominal pain for any changes. The following actions may help to alleviate any discomfort you are experiencing:  Only take over-the-counter or prescription medicines as directed by your health care provider.  Do not take laxatives unless directed to do so by your health care provider.  Try a clear liquid diet (broth, tea, or water) as directed by your health care provider. Slowly move to a bland diet as tolerated. SEEK MEDICAL CARE IF:  You have unexplained abdominal pain.  You have abdominal pain associated with nausea or diarrhea.  You have pain when you urinate or have a bowel movement.  You experience abdominal pain that wakes you in the night.  You have abdominal pain that is worsened or improved by eating food.  You have abdominal pain that is worsened with eating fatty foods.  You have a fever. SEEK IMMEDIATE MEDICAL CARE IF:  Your pain does not go away within 2 hours.  You keep throwing up (vomiting).  Your pain is felt  only in portions of the abdomen, such as the right side or the left lower portion of the abdomen.  You pass bloody or black tarry stools. MAKE SURE YOU:  Understand these instructions.  Will watch your condition.  Will get help right away if you are not doing well or get worse.   This information is not intended to replace advice given to you by your health care provider. Make sure you discuss any questions you have with your health care provider.   Document Released: 08/26/2005 Document Revised: 08/07/2015 Document Reviewed: 07/26/2013 Elsevier Interactive Patient Education [employer]2016 Elsevier Inc.

## 2016-04-06 NOTE — ED Notes (Signed)
Patient states while she was cooking supper had the sudden onset of lower abdominal pain. Nausea without vomiting. Denies dysuria, lower back or frequency/urgency. History of UTI and states this doesn't feel quite like that but is unsure.

## 2016-04-06 NOTE — ED Provider Notes (Signed)
[facility] Emergency Department Provider Note  Time seen: 10:28 PM  I have reviewed the triage vital signs and the nursing notes.   HISTORY  Chief Complaint Abdominal Pain    HPI Brittney Sanders is a 30 y.o. female with a past medical history of kidney stones, depression, presents the emergency department with lower abdominal discomfort and nausea. According to the patient she was feeling well today however during the evening while she was making dinner she acutely became nauseated. Denies any vomiting. States she felt lower abdomen pressure, somewhat similar to her past UTIs that she came to the emergency department for evaluation. Denies any dysuria.Denies any fever. States she is feeling somewhat better in the emergency department. Denies vaginal bleeding or discharge. Denies diarrhea.     Past Medical History  Diagnosis Date  . Seasonal allergies   . Kidney stone   . Plantar fasciitis   . Depression   . Eczema   . Heel spur   . UTI (lower urinary tract infection)   . Obesity   . Hearing deficit   . Left nephrolithiasis   . Nocturia   . Overweight     Patient Active Problem List   Diagnosis Date Noted  . Auditory disturbance 12/30/2015  . Adiposity 12/30/2015  . Renal stone 12/17/2015  . Pyelonephritis 12/17/2015  . 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 Calculus of kidney 08/16/2014  . Allergic rhinitis 04/03/2014  . Stress fracture of calcaneus 03/14/2014  . Calcaneal spur 02/23/2014  . Plantar fasciitis 02/23/2014    Past Surgical History  Procedure Laterality Date  . Tympanostomy tube placement Right   . Tubal ligation      Current Outpatient Rx  Name  Route  Sig  Dispense  Refill  . azelastine (OPTIVAR) 0.05 % ophthalmic solution               . cetirizine (ZYRTEC) 10 MG tablet   Oral   Take 10 mg by mouth daily.         . Loratadine 10 MG CAPS               . ondansetron (ZOFRAN-ODT) 4 MG disintegrating tablet   Oral    Take 4 mg by mouth every 8 (eight) hours as needed for nausea or vomiting.         Marland Kitchen. PARoxetine (PAXIL) 40 MG tablet   Oral   Take 1 tablet by mouth daily.         . phentermine (ADIPEX-P) 37.5 MG tablet   Oral   Take 1 tablet by mouth daily.         Marland Kitchen. topiramate (TOPAMAX SPRINKLE) 25 MG capsule   Oral   Take 25 mg by mouth daily.         Terald Sleeper. VENTOLIN HFA 108 (90 Base) MCG/ACT inhaler                 Dispense as written.     Allergies Sulfa antibiotics  Family History  Problem Relation Age of Onset  . Urolithiasis Maternal Grandmother   . Kidney disease Neg Hx   . Bladder Cancer Neg Hx     Social History Social History  Substance Use Topics  . Smoking status: Never Smoker   . Smokeless tobacco: None  . Alcohol Use: No    Review of Systems Constitutional: Negative for fever. Cardiovascular: Negative for chest pain. Respiratory: Negative for shortness of breath. Gastrointestinal: Lower abdominal pressure. Negative for vomiting or  diarrhea. Positive for nausea. Genitourinary: Negative for dysuria. Negative for vaginal bleeding or discharge. Musculoskeletal: Negative for back pain. Neurological: Negative for headache 10-point ROS otherwise negative.  ____________________________________________   PHYSICAL EXAM:  VITAL SIGNS: ED Triage Vitals  Enc Vitals Group     BP 04/06/16 1921 145/87 mmHg     Pulse Rate 04/06/16 1921 68     Resp 04/06/16 1921 18     Temp 04/06/16 1921 98.3 F (36.8 C)     Temp Source 04/06/16 1921 Oral     SpO2 04/06/16 1921 100 %     Weight 04/06/16 1921 249 lb (112.946 kg)     Height 04/06/16 1921  (1.6 m)     Head Cir --      Peak Flow --      Pain Score 04/06/16 1922 6     Pain Loc --      Pain Edu? --      Excl. in GC? --     Constitutional: Alert and oriented. Well appearing and in no distress. Eyes: Normal exam ENT   Head: Normocephalic and atraumatic.   Mouth/Throat: Mucous membranes are  moist. Cardiovascular: Normal rate, regular rhythm. No murmur Respiratory: Normal respiratory effort without tachypnea nor retractions. Breath sounds are clear  Gastrointestinal: Soft and nontender. No distention.  There is no CVA tenderness. Genitourinary: Pelvic exam shows a mild amount of clear/white discharge. There does appear to be a small lesion on the patient's cervix was some cervical erythema. No cervical motion tenderness or adnexal tenderness. Musculoskeletal: Nontender with normal range of motion in all extremities.  Neurologic:  Normal speech and language. No gross focal neurologic deficits Skin:  Skin is warm, dry and intact.  Psychiatric: Mood and affect are normal.   ____________________________________________     INITIAL IMPRESSION / ASSESSMENT AND PLAN / ED COURSE  Pertinent labs & imaging results that were available during my care of the patient were reviewed by me and considered in my medical decision making (see chart for details).  Patient presents emergency department with nausea and some lower abdominal pressure. Overall the patient appears very well, mild suprapubic tenderness on exam. Pelvic exam shows a mild amount of clear discharge she does have a cervical lesion. Patient states her last Pap smear was 2-3 years ago. I advised the patient follow up with her GYN as soon as possible for another Pap smear, patient is agreeable. Patient's labs appear overall normal besides a mild leukocytosis. Wet prep is within normal limits besides white blood cells. Currently awaiting chemistry results.  Chemistry within normal limits. We'll discharge patient home with a short course of nausea medication, the patient is return to the emergency department for any worsening symptoms otherwise she should follow up with a primary care doctor. ____________________________________________   FINAL CLINICAL IMPRESSION(S) / ED DIAGNOSES  Nausea Abdominal pain   Minna Antis,  MD 04/06/16 2303

## 2016-12-31 ENCOUNTER — Ambulatory Visit: Payer: Medicare HMO | Admitting: Urology

## 2016-12-31 ENCOUNTER — Other Ambulatory Visit: Payer: Self-pay | Admitting: Family Medicine

## 2016-12-31 DIAGNOSIS — N200 Calculus of kidney: Secondary | ICD-10-CM

## 2017-01-01 ENCOUNTER — Ambulatory Visit
Admission: RE | Admit: 2017-01-01 | Discharge: 2017-01-01 | Disposition: A | Discharge status: Home / Self Care | Payer: Medicare HMO | Source: Ambulatory Visit | Attending: Urology | Admitting: Urology

## 2017-01-01 DIAGNOSIS — N200 Calculus of kidney: Secondary | ICD-10-CM | POA: Diagnosis not present

## 2017-01-04 ENCOUNTER — Telehealth: Payer: Self-pay | Admitting: Urology

## 2017-01-04 ENCOUNTER — Encounter: Payer: Self-pay | Admitting: Urology

## 2017-01-04 ENCOUNTER — Ambulatory Visit (INDEPENDENT_AMBULATORY_CARE_PROVIDER_SITE_OTHER): Payer: Medicare HMO | Admitting: Urology

## 2017-01-04 VITALS — BP 126/84 | HR 86 | Ht 63.0 in | Wt 246.0 lb

## 2017-01-04 DIAGNOSIS — N200 Calculus of kidney: Secondary | ICD-10-CM

## 2017-01-04 NOTE — Telephone Encounter (Signed)
Patient would like your opinion on her stone and how to proceed thanks

## 2017-01-04 NOTE — Telephone Encounter (Signed)
Patient requesting a call from the nurse that she saw in the office this morning.

## 2017-01-04 NOTE — Progress Notes (Signed)
The patient is seen today in follow-up after undergoing a voiding cystometric  Urogram (VCUG).   The patient underwent a VCUG to evaluate for reflux of her ureter. She has recently seen and admitted to the hospital for left pyelonephritis. The kidney was noted to be slightly atrophied with some parenchymal scarring. There was associated perinephric stranding. The patient states that she gets urinary tract infections about once a year. These seem to turn into kidney infections.   The patient has grade 1 left ureteral reflux. There is no evidence of right ureteral reflux.   The patient denies any progressive voiding symptoms, constipation, or neurological changes.  Interval: The patient is seen today for one-year follow-up for vesicoureteral reflux on the left as well as a left-sided nephrolithiasis. She is undergoing ultrasound prior to her visit today.  Over the course of the last year, the patient has not had any urinary tract infections of progressive urinary tract symptoms. She denies any flank pain she denies any fever/chills. She has not passed her stone. She denies any gross hematuria.  Current Outpatient Prescriptions on File Prior to Visit  Medication Sig Dispense Refill  . azelastine (OPTIVAR) 0.05 % ophthalmic solution     . cetirizine (ZYRTEC) 10 MG tablet Take 10 mg by mouth daily.    . Loratadine 10 MG CAPS     . ondansetron (ZOFRAN ODT) 4 MG disintegrating tablet Take 1 tablet (4 mg total) by mouth every 8 (eight) hours as needed for nausea or vomiting. 20 tablet 0  . PARoxetine (PAXIL) 40 MG tablet Take 1 tablet by mouth daily.    . phentermine (ADIPEX-P) 37.5 MG tablet Take 1 tablet by mouth daily.    Marland Kitchen topiramate (TOPAMAX SPRINKLE) 25 MG capsule Take 25 mg by mouth daily.    . VENTOLIN HFA 108 (90 Base) MCG/ACT inhaler      No current facility-administered medications on file prior to visit.    Past Medical History:  Diagnosis Date  . Depression   . Eczema   . Hearing  deficit   . Heel spur   . Kidney stone   . Left nephrolithiasis   . Nocturia   . Obesity   . Overweight   . Plantar fasciitis   . Seasonal allergies   . UTI (lower urinary tract infection)    The patient has a family history of Social History  Substance Use Topics  . Smoking status: Never Smoker  . Smokeless tobacco: Never Used  . Alcohol use No   PE: Vitals:   01/04/17 0921  BP: 126/84  Pulse: 86   NAD Abdomen is soft   The patient's urine analysis is unavailable for evaluation.  I have independently reviewed the patient's ultrasound including the report and the images. These demonstrate no hydronephrosis with some cortical atrophy and a small lower pole left nephrolithiasis.  Impression: The patient has a history of grade 1 left-sided vesicoureteral reflux and over the past years not had any infections. She's not had a progressive symptoms. She is not on suppressive antibiotics. Her ultrasound demonstrates no progression or hydronephrosis. Given the above, I have recommended that we follow-up on this reflux on an as-needed basis. We did discuss that she should be seen if she has urinary tract symptoms that are progressive and signs of infection to prevent her kidney from becoming infection.  We also discussed management of a nonobstructing stone. We discussed the options of surveillance, treatment, or conservative management of following up as needed. The patient  is not anxious about stone and is not sure she would like to be treated  prophylactically. As such, we will schedule her as needed to follow-up, the patient may contact us if she desires treatment in the future. However, she will also follow up with us if she develops renal colic-like symptoms.   CC: please send a copy of this to the patient's primary care provider.

## 2017-01-07 NOTE — Telephone Encounter (Signed)
I advised the patient to RTC in one year for KUB.

## 2017-01-29 ENCOUNTER — Other Ambulatory Visit: Payer: Commercial Managed Care - HMO

## 2017-02-04 ENCOUNTER — Emergency Department
Admission: EM | Admit: 2017-02-04 | Discharge: 2017-02-04 | Disposition: A | Discharge status: Home / Self Care | Payer: Medicare HMO | Attending: Emergency Medicine | Admitting: Emergency Medicine

## 2017-02-04 ENCOUNTER — Encounter: Payer: Self-pay | Admitting: Emergency Medicine

## 2017-02-04 ENCOUNTER — Emergency Department: Payer: Medicare HMO

## 2017-02-04 DIAGNOSIS — N390 Urinary tract infection, site not specified: Secondary | ICD-10-CM | POA: Diagnosis not present

## 2017-02-04 DIAGNOSIS — R1032 Left lower quadrant pain: Secondary | ICD-10-CM

## 2017-02-04 LAB — COMPREHENSIVE METABOLIC PANEL
ALT: 13 U/L — ABNORMAL LOW (ref 14–54)
ANION GAP: 9 (ref 5–15)
AST: 23 U/L (ref 15–41)
Albumin: 4.3 g/dL (ref 3.5–5.0)
Alkaline Phosphatase: 66 U/L (ref 38–126)
BUN: 11 mg/dL (ref 6–20)
CHLORIDE: 105 mmol/L (ref 101–111)
CO2: 25 mmol/L (ref 22–32)
CREATININE: 1.01 mg/dL — AB (ref 0.44–1.00)
Calcium: 9.1 mg/dL (ref 8.9–10.3)
Glucose, Bld: 144 mg/dL — ABNORMAL HIGH (ref 65–99)
POTASSIUM: 3.4 mmol/L — AB (ref 3.5–5.1)
Sodium: 139 mmol/L (ref 135–145)
Total Bilirubin: 0.7 mg/dL (ref 0.3–1.2)
Total Protein: 7.3 g/dL (ref 6.5–8.1)

## 2017-02-04 LAB — URINALYSIS, COMPLETE (UACMP) WITH MICROSCOPIC
Bacteria, UA: NONE SEEN
Bilirubin Urine: NEGATIVE
GLUCOSE, UA: NEGATIVE mg/dL
Hgb urine dipstick: NEGATIVE
KETONES UR: NEGATIVE mg/dL
Nitrite: NEGATIVE
PROTEIN: NEGATIVE mg/dL
Specific Gravity, Urine: 1.014 (ref 1.005–1.030)
pH: 8 (ref 5.0–8.0)

## 2017-02-04 LAB — CBC
HEMATOCRIT: 41.8 % (ref 35.0–47.0)
HEMOGLOBIN: 14.4 g/dL (ref 12.0–16.0)
MCH: 28.6 pg (ref 26.0–34.0)
MCHC: 34.5 g/dL (ref 32.0–36.0)
MCV: 82.9 fL (ref 80.0–100.0)
PLATELETS: 377 10*3/uL (ref 150–440)
RBC: 5.04 MIL/uL (ref 3.80–5.20)
RDW: 14.8 % — ABNORMAL HIGH (ref 11.5–14.5)
WBC: 14.4 10*3/uL — AB (ref 3.6–11.0)

## 2017-02-04 LAB — POCT PREGNANCY, URINE: PREG TEST UR: NEGATIVE

## 2017-02-04 LAB — LIPASE, BLOOD: LIPASE: 17 U/L (ref 11–51)

## 2017-02-04 MED ORDER — ONDANSETRON HCL 4 MG/2ML IJ SOLN
4.0000 mg | Freq: Once | INTRAMUSCULAR | Status: AC
  Administered 2017-02-04: 4 mg via INTRAVENOUS
  Filled 2017-02-04: qty 2

## 2017-02-04 MED ORDER — HYDROMORPHONE HCL 1 MG/ML IJ SOLN
1.0000 mg | Freq: Once | INTRAMUSCULAR | Status: AC
  Administered 2017-02-04: 1 mg via INTRAVENOUS
  Filled 2017-02-04: qty 1

## 2017-02-04 MED ORDER — CEFTRIAXONE SODIUM-DEXTROSE 1-3.74 GM-% IV SOLR
1.0000 g | Freq: Once | INTRAVENOUS | Status: AC
  Administered 2017-02-04: 1 g via INTRAVENOUS
  Filled 2017-02-04: qty 50

## 2017-02-04 MED ORDER — DEXTROSE 5 % IV SOLN: 1.0000 g | Freq: Once | INTRAVENOUS | Status: DC

## 2017-02-04 MED ORDER — OXYCODONE-ACETAMINOPHEN 5-325 MG PO TABS: 1.0000 | ORAL_TABLET | ORAL | 0 refills | Status: DC | PRN

## 2017-02-04 MED ORDER — CEPHALEXIN 500 MG PO CAPS: 500.0000 mg | ORAL_CAPSULE | Freq: Three times a day (TID) | ORAL | 0 refills | Status: DC

## 2017-02-04 MED ORDER — SODIUM CHLORIDE 0.9 % IV BOLUS (SEPSIS)
1000.0000 mL | Freq: Once | INTRAVENOUS | Status: AC
  Administered 2017-02-04: 1000 mL via INTRAVENOUS

## 2017-02-04 MED ORDER — OXYCODONE-ACETAMINOPHEN 5-325 MG PO TABS
ORAL_TABLET | ORAL | Status: AC
  Administered 2017-02-04: 1
  Filled 2017-02-04: qty 1

## 2017-02-04 MED ORDER — IBUPROFEN 800 MG PO TABS: 800.0000 mg | ORAL_TABLET | Freq: Three times a day (TID) | ORAL | 0 refills | Status: DC | PRN

## 2017-02-04 MED ORDER — ONDANSETRON 4 MG PO TBDP: 4.0000 mg | ORAL_TABLET | Freq: Three times a day (TID) | ORAL | 0 refills | Status: DC | PRN

## 2017-02-04 NOTE — ED Provider Notes (Signed)
[facility] Emergency Department Provider Note   ____________________________________________   First MD Initiated Contact with Patient 02/04/17 [phone]     (approximate)  I have reviewed the triage vital signs and the nursing notes.   HISTORY  Chief Complaint Abdominal Pain    HPI Brittney Sanders is a 31 y.o. female who presents to the ED from home with a chief complaint of left flank and left lower quadrant pain. Patient reports being awakened from sleep approximately 12:30 AM with the above complaint. Symptoms associated with nausea and vomiting. History of kidney stones and states this feels similarly. Does also note urinary frequency. Denies associated fever, chills, chest pain, shortness of breath, diarrhea.Denies recent travel or trauma. Nothing makes her symptoms better or worse.   Past Medical History:  Diagnosis Date  . Depression   . Eczema   . Hearing deficit   . Heel spur   . Kidney stone   . Left nephrolithiasis   . Nocturia   . Obesity   . Overweight   . Plantar fasciitis   . Seasonal allergies   . UTI (lower urinary tract infection)     Patient Active Problem List   Diagnosis Date Noted  . Auditory disturbance 12/30/2015  . Adiposity 12/30/2015  . Renal stone 12/17/2015  . Pyelonephritis 12/17/2015  . 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 Calculus of kidney 08/16/2014  . Allergic rhinitis 04/03/2014  . Stress fracture of calcaneus 03/14/2014  . Calcaneal spur 02/23/2014  . Plantar fasciitis 02/23/2014    Past Surgical History:  Procedure Laterality Date  . TUBAL LIGATION    . TYMPANOSTOMY TUBE PLACEMENT Right     Prior to Admission medications   Medication Sig Start Date End Date Taking? Authorizing Provider  azelastine (OPTIVAR) 0.05 % ophthalmic solution  10/16/15   Historical Provider, MD  cephALEXin (KEFLEX) 500 MG capsule Take 1 capsule (500 mg total) by mouth 3 (three) times daily. 02/04/17   Irean HongJade J Sung, MD  cetirizine  (ZYRTEC) 10 MG tablet Take 10 mg by mouth daily.    Historical Provider, MD  ibuprofen (ADVIL,MOTRIN) 800 MG tablet Take 1 tablet (800 mg total) by mouth every 8 (eight) hours as needed for moderate pain. 02/04/17   Irean HongJade J Sung, MD  Loratadine 10 MG CAPS  12/01/15   Historical Provider, MD  ondansetron (ZOFRAN ODT) 4 MG disintegrating tablet Take 1 tablet (4 mg total) by mouth every 8 (eight) hours as needed for nausea or vomiting. 02/04/17   Irean HongJade J Sung, MD  oxyCODONE-acetaminophen (ROXICET) 5-325 MG tablet Take 1 tablet by mouth every 4 (four) hours as needed for severe pain. 02/04/17   Irean HongJade J Sung, MD  PARoxetine (PAXIL) 40 MG tablet Take 1 tablet by mouth daily. 10/06/15   Historical Provider, MD  phentermine (ADIPEX-P) 37.5 MG tablet Take 1 tablet by mouth daily. 10/21/15   Historical Provider, MD  topiramate (TOPAMAX SPRINKLE) 25 MG capsule Take 25 mg by mouth daily.    Historical Provider, MD  VENTOLIN HFA 108 [phone](90 Base) MCG/ACT inhaler  11/26/15   Historical Provider, MD    Allergies Sulfa antibiotics  Family History  Problem Relation Age of Onset  . Urolithiasis Maternal Grandmother   . Kidney disease Neg Hx   . Bladder Cancer Neg Hx     Social History Social History  Substance Use Topics  . Smoking status: Never Smoker  . Smokeless tobacco: Never Used  . Alcohol use No    Review of  Systems  Constitutional: No fever/chills.  Eyes: No visual changes. ENT: No sore throat. Cardiovascular: Denies chest pain. Respiratory: Denies shortness of breath. Gastrointestinal: Positive for left flank and lower quadrant abdominal pain.  Positive for vomiting.  No diarrhea.  No constipation. Genitourinary: Positive for frequency. Negative for dysuria. Musculoskeletal: Negative for back pain. Skin: Negative for rash. Neurological: Negative for headaches, focal weakness or numbness.  10-point ROS otherwise negative.  ____________________________________________   PHYSICAL EXAM:  VITAL  SIGNS: ED Triage Vitals  Enc Vitals Group     BP 02/04/17 0039 (!) 144/80     Pulse Rate 02/04/17 0039 71     Resp 02/04/17 0039 18     Temp 02/04/17 0039 98 F (36.7 C)     Temp Source 02/04/17 0039 Oral     SpO2 02/04/17 0039 100 %     Weight 02/04/17 0041 246 lb (111.6 kg)     Height --      Head Circumference --      Peak Flow --      Pain Score 02/04/17 0342 6     Pain Loc --      Pain Edu? --      Excl. in GC? --     Constitutional: Alert and oriented. Well appearing and in mild acute distress. Eyes: Conjunctivae are normal. PERRL. EOMI. Head: Atraumatic. Nose: No congestion/rhinnorhea. Mouth/Throat: Mucous membranes are moist.  Oropharynx non-erythematous. Neck: No stridor.   Cardiovascular: Normal rate, regular rhythm. Grossly normal heart sounds.  Good peripheral circulation. Respiratory: Normal respiratory effort.  No retractions. Lungs CTAB. Gastrointestinal: Soft and mildly tender to palpation lateral and left lower abdomen. No distention. No abdominal bruits. Mild left CVA tenderness. Musculoskeletal: No lower extremity tenderness nor edema.  No joint effusions. Neurologic:  Normal speech and language. No gross focal neurologic deficits are appreciated. No gait instability. Skin:  Skin is warm, dry and intact. No rash noted. Psychiatric: Mood and affect are normal. Speech and behavior are normal.  ____________________________________________   LABS (all labs ordered are listed, but only abnormal results are displayed)  Labs Reviewed  COMPREHENSIVE METABOLIC PANEL - Abnormal; Notable for the following:       Result Value   Potassium 3.4 (*)    Glucose, Bld 144 (*)    Creatinine, Ser 1.01 (*)    ALT 13 (*)    All other components within normal limits  CBC - Abnormal; Notable for the following:    WBC 14.4 (*)    RDW 14.8 (*)    All other components within normal limits  URINALYSIS, COMPLETE (UACMP) WITH MICROSCOPIC - Abnormal; Notable for the following:      Color, Urine YELLOW (*)    APPearance HAZY (*)    Leukocytes, UA SMALL (*)    Squamous Epithelial / LPF 0-5 (*)    All other components within normal limits  LIPASE, BLOOD  POC URINE PREG, ED  POCT PREGNANCY, URINE   ____________________________________________  EKG  None ____________________________________________  RADIOLOGY  CT renal stone study interpreted per Dr. Clovis Riley: Stable renal cortical scarring, particularly in the lower pole the  left kidney. No acute findings.   ____________________________________________   PROCEDURES  Procedure(s) performed: None  Procedures  Critical Care performed: No  ____________________________________________   INITIAL IMPRESSION / ASSESSMENT AND PLAN / ED COURSE  Pertinent labs & imaging results that were available during my care of the patient were reviewed by me and considered in my medical decision making (see chart for  details).  31 year old female with a history of nephrolithiasis who presents with left flank to left lower quadrant abdominal pain. Laboratory results remarkable for mild leukocytosis and TNTC WBC on urinalysis. Will initiate IV fluid resuscitation, administer IV analgesia and proceed to CT renal stone study to evaluate for obstructing stone.  Clinical Course as of Feb 05 511  Thu Feb 04, 2017  0508 Updated patient and spouse of CT imaging results. Patient feeling better overall. Will infuse IV antibiotic discharged home with prescriptions for antibiotic, analgesia and antiemetic. Strict return precautions given. Both verbalize understanding and agree with plan of care.  [JS]    Clinical Course User Index [JS] Irean Hong, MD     ____________________________________________   FINAL CLINICAL IMPRESSION(S) / ED DIAGNOSES  Final diagnoses:  Left lower quadrant pain  Lower urinary tract infectious disease      NEW MEDICATIONS STARTED DURING THIS VISIT:  New Prescriptions   CEPHALEXIN  (KEFLEX) 500 MG CAPSULE    Take 1 capsule (500 mg total) by mouth 3 (three) times daily.   IBUPROFEN (ADVIL,MOTRIN) 800 MG TABLET    Take 1 tablet (800 mg total) by mouth every 8 (eight) hours as needed for moderate pain.   ONDANSETRON (ZOFRAN ODT) 4 MG DISINTEGRATING TABLET    Take 1 tablet (4 mg total) by mouth every 8 (eight) hours as needed for nausea or vomiting.   OXYCODONE-ACETAMINOPHEN (ROXICET) 5-325 MG TABLET    Take 1 tablet by mouth every 4 (four) hours as needed for severe pain.     Note:  This document was prepared using Dragon voice recognition software and may include unintentional dictation errors.    Irean Hong, MD 02/06/17 [phone]

## 2017-02-04 NOTE — ED Notes (Signed)
Pt returns to room; st pain decreased to 5/10 at present

## 2017-02-04 NOTE — ED Notes (Signed)
Pt to CT via stretcher accomp by CT tech 

## 2017-02-04 NOTE — ED Notes (Signed)
Pt uprite on stretcher in exam room with no distress noted; SO at bedside; pt reports being awoke PTA with left flank pain radiating into left lower abd; st recent foul smell urine and has hx of kidney stones; st at present left flank pain nonradiating; resp even/unlab, lungs clear; apical audible & regular; +BS, abd soft/nondist

## 2017-02-04 NOTE — ED Notes (Signed)
Patient discharged to home per MD order. Patient in stable condition, and deemed medically cleared by ED provider for discharge. Discharge instructions reviewed with patient/family using "Teach Back"; verbalized understanding of medication education and administration, and information about follow-up care. Denies further concerns. ° °

## 2017-02-04 NOTE — ED Triage Notes (Signed)
Pt ambulatory to triage with steady gait, no distress noted. Pt c/o LLQ abdominal pain and vomiting that started tonight. Denies diarrhea.

## 2017-02-04 NOTE — Discharge Instructions (Signed)
1. Take antibiotic as prescribed (Keflex 500 mg 3 times daily 7 days). 2. You may take pain and nausea medicines as needed (Motrin/Percocet/Zofran). 3. Return to the ER for worsening symptoms, persistent vomiting, difficulty breathing or other concerns.

## 2017-02-05 ENCOUNTER — Telehealth: Payer: Self-pay | Admitting: Urology

## 2017-02-05 NOTE — Telephone Encounter (Signed)
Pt went to ER for UTI.  They put her on Cephalexin 500 mg.  She just wanted to make you aware.

## 2017-02-05 NOTE — Telephone Encounter (Signed)
The ED did not do an urine culture, so if her symptoms do not improve she needs to see us.

## 2017-02-05 NOTE — Telephone Encounter (Signed)
Spoke with pt in reference to ER not doing a ucx. Made pt aware if s/s dont improve will need to be seen. Pt voiced understanding.

## 2017-05-06 ENCOUNTER — Emergency Department: Payer: Medicare HMO

## 2017-05-06 ENCOUNTER — Emergency Department
Admission: EM | Admit: 2017-05-06 | Discharge: 2017-05-06 | Disposition: A | Discharge status: Home / Self Care | Payer: Medicare HMO | Attending: Emergency Medicine | Admitting: Emergency Medicine

## 2017-05-06 ENCOUNTER — Encounter: Payer: Self-pay | Admitting: Emergency Medicine

## 2017-05-06 DIAGNOSIS — R300 Dysuria: Secondary | ICD-10-CM | POA: Insufficient documentation

## 2017-05-06 DIAGNOSIS — R1030 Lower abdominal pain, unspecified: Secondary | ICD-10-CM | POA: Diagnosis present

## 2017-05-06 DIAGNOSIS — Z79899 Other long term (current) drug therapy: Secondary | ICD-10-CM | POA: Diagnosis not present

## 2017-05-06 DIAGNOSIS — Z87442 Personal history of urinary calculi: Secondary | ICD-10-CM | POA: Diagnosis not present

## 2017-05-06 DIAGNOSIS — Z791 Long term (current) use of non-steroidal anti-inflammatories (NSAID): Secondary | ICD-10-CM | POA: Diagnosis not present

## 2017-05-06 DIAGNOSIS — R109 Unspecified abdominal pain: Secondary | ICD-10-CM

## 2017-05-06 LAB — LIPASE, BLOOD: Lipase: 26 U/L (ref 11–51)

## 2017-05-06 LAB — URINALYSIS, COMPLETE (UACMP) WITH MICROSCOPIC
Bacteria, UA: NONE SEEN
Bilirubin Urine: NEGATIVE
GLUCOSE, UA: NEGATIVE mg/dL
Hgb urine dipstick: NEGATIVE
KETONES UR: NEGATIVE mg/dL
LEUKOCYTES UA: NEGATIVE
Nitrite: NEGATIVE
PH: 7 (ref 5.0–8.0)
Protein, ur: NEGATIVE mg/dL
Specific Gravity, Urine: 1.015 (ref 1.005–1.030)

## 2017-05-06 LAB — CBC
HEMATOCRIT: 42 % (ref 35.0–47.0)
HEMOGLOBIN: 14.5 g/dL (ref 12.0–16.0)
MCH: 28.7 pg (ref 26.0–34.0)
MCHC: 34.6 g/dL (ref 32.0–36.0)
MCV: 83 fL (ref 80.0–100.0)
Platelets: 321 10*3/uL (ref 150–440)
RBC: 5.07 MIL/uL (ref 3.80–5.20)
RDW: 14.1 % (ref 11.5–14.5)
WBC: 12.8 10*3/uL — AB (ref 3.6–11.0)

## 2017-05-06 LAB — HEPATIC FUNCTION PANEL
ALBUMIN: 4.2 g/dL (ref 3.5–5.0)
ALK PHOS: 63 U/L (ref 38–126)
ALT: 13 U/L — AB (ref 14–54)
AST: 21 U/L (ref 15–41)
Bilirubin, Direct: 0.1 mg/dL (ref 0.1–0.5)
Indirect Bilirubin: 0.4 mg/dL (ref 0.3–0.9)
TOTAL PROTEIN: 7.5 g/dL (ref 6.5–8.1)
Total Bilirubin: 0.5 mg/dL (ref 0.3–1.2)

## 2017-05-06 LAB — BASIC METABOLIC PANEL
Anion gap: 7 (ref 5–15)
BUN: 12 mg/dL (ref 6–20)
CO2: 28 mmol/L (ref 22–32)
Calcium: 9.4 mg/dL (ref 8.9–10.3)
Chloride: 103 mmol/L (ref 101–111)
Creatinine, Ser: 0.97 mg/dL (ref 0.44–1.00)
Glucose, Bld: 84 mg/dL (ref 65–99)
POTASSIUM: 3.7 mmol/L (ref 3.5–5.1)
SODIUM: 138 mmol/L (ref 135–145)

## 2017-05-06 LAB — POCT PREGNANCY, URINE: PREG TEST UR: NEGATIVE

## 2017-05-06 MED ORDER — OXYCODONE HCL 5 MG PO TABS
5.0000 mg | ORAL_TABLET | Freq: Once | ORAL | Status: AC
  Administered 2017-05-06: 5 mg via ORAL
  Filled 2017-05-06: qty 1

## 2017-05-06 MED ORDER — PHENAZOPYRIDINE HCL 200 MG PO TABS: 200.0000 mg | ORAL_TABLET | Freq: Three times a day (TID) | ORAL | 0 refills | Status: AC | PRN

## 2017-05-06 MED ORDER — ACETAMINOPHEN 500 MG PO TABS
1000.0000 mg | ORAL_TABLET | Freq: Once | ORAL | Status: AC
  Administered 2017-05-06: 1000 mg via ORAL
  Filled 2017-05-06: qty 2

## 2017-05-06 NOTE — ED Notes (Signed)
E signature pad not working 

## 2017-05-06 NOTE — ED Provider Notes (Addendum)
[facility] Emergency Department Provider Note  ____________________________________________  Time seen: Approximately 5:47 PM  I have reviewed the triage vital signs and the nursing notes.   HISTORY  Chief Complaint Flank Pain   HPI Brittney Sanders is a 31 y.o. female with a history of kidney stones, ureteral reflux, and recurrent UTIs who presents for evaluation of bilateral flank pain and foul-smelling urine. Patient reports her symptoms have been present for 2 days. Her pain is mild to moderate, dull, located in her bilateral flanks, R>L, constant and nonradiating. No nausea or vomiting. She has had urinary frequency but no dysuria. No fever or chills, no abdominal pain, no chest pain or shortness of breath. No vaginal discharge. No diarrhea or constipation. Patient has not tried anything at home for the pain.Patient reports the pain is nonpleuritic, no recent travel immobilization, no personal or family history of blood clots, no leg pain or swelling, no exogenous hormones, no hemoptysis.  Past Medical History:  Diagnosis Date  . Depression   . Eczema   . Hearing deficit   . Heel spur   . Kidney stone   . Left nephrolithiasis   . Nocturia   . Obesity   . Overweight   . Plantar fasciitis   . Seasonal allergies   . UTI (lower urinary tract infection)     Patient Active Problem List   Diagnosis Date Noted  . Auditory disturbance 12/30/2015  . Adiposity 12/30/2015  . Renal stone 12/17/2015  . Pyelonephritis 12/17/2015  . 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 Calculus of kidney 08/16/2014  . Allergic rhinitis 04/03/2014  . Stress fracture of calcaneus 03/14/2014  . Calcaneal spur 02/23/2014  . Plantar fasciitis 02/23/2014    Past Surgical History:  Procedure Laterality Date  . TUBAL LIGATION    . TYMPANOSTOMY TUBE PLACEMENT Right     Prior to Admission medications   Medication Sig Start Date End Date Taking? Authorizing Provider  azelastine  (OPTIVAR) 0.05 % ophthalmic solution  10/16/15   [provider]  cephALEXin (KEFLEX) 500 MG capsule Take 1 capsule (500 mg total) by mouth 3 (three) times daily. 02/04/17   Irean Hong, MD  cetirizine (ZYRTEC) 10 MG tablet Take 10 mg by mouth daily.    [provider]  ibuprofen (ADVIL,MOTRIN) 800 MG tablet Take 1 tablet (800 mg total) by mouth every 8 (eight) hours as needed for moderate pain. 02/04/17   Irean Hong, MD  Loratadine 10 MG CAPS  12/01/15   [provider]  ondansetron (ZOFRAN ODT) 4 MG disintegrating tablet Take 1 tablet (4 mg total) by mouth every 8 (eight) hours as needed for nausea or vomiting. 02/04/17   Irean Hong, MD  oxyCODONE-acetaminophen (ROXICET) 5-325 MG tablet Take 1 tablet by mouth every 4 (four) hours as needed for severe pain. 02/04/17   Irean Hong, MD  PARoxetine (PAXIL) 40 MG tablet Take 1 tablet by mouth daily. 10/06/15   [provider]  phenazopyridine (PYRIDIUM) 200 MG tablet Take 1 tablet (200 mg total) by mouth 3 (three) times daily as needed for pain. 05/06/17 05/06/18  Nita Sickle, MD  phentermine (ADIPEX-P) 37.5 MG tablet Take 1 tablet by mouth daily. 10/21/15   [provider]  topiramate (TOPAMAX SPRINKLE) 25 MG capsule Take 25 mg by mouth daily.    [provider]  VENTOLIN HFA 108 [phone] Base) MCG/ACT inhaler  11/26/15   [provider]    Allergies Sulfa antibiotics  Family History  Problem Relation Age of Onset  . Urolithiasis Maternal Grandmother   . Kidney disease Neg Hx   . Bladder Cancer Neg Hx     Social History Social History  Substance Use Topics  . Smoking status: Never Smoker  . Smokeless tobacco: Never Used  . Alcohol use No    Review of Systems  Constitutional: Negative for fever. Eyes: Negative for visual changes. ENT: Negative for sore throat. Neck: No neck pain  Cardiovascular: Negative for chest pain. Respiratory: Negative for shortness of  breath. Gastrointestinal: Negative for abdominal pain, vomiting or diarrhea. Genitourinary: + dysuria and b/l flank pain Musculoskeletal: Negative for back pain. Skin: Negative for rash. Neurological: Negative for headaches, weakness or numbness. Psych: No SI or HI  ____________________________________________   PHYSICAL EXAM:  VITAL SIGNS: ED Triage Vitals  Enc Vitals Group     BP 05/06/17 1538 134/78     Pulse Rate 05/06/17 1538 85     Resp 05/06/17 1538 20     Temp 05/06/17 1538 98.5 F (36.9 C)     Temp Source 05/06/17 1538 Oral     SpO2 05/06/17 1538 100 %     Weight 05/06/17 1537 260 lb (117.9 kg)     Height 05/06/17 1537 5\' 3"  (1.6 m)     Head Circumference --      Peak Flow --      Pain Score 05/06/17 1537 8     Pain Loc --      Pain Edu? --      Excl. in GC? --     Constitutional: Alert and oriented. Well appearing and in no apparent distress. HEENT:      Head: Normocephalic and atraumatic.         Eyes: Conjunctivae are normal. Sclera is non-icteric.       Mouth/Throat: Mucous membranes are moist.       Neck: Supple with no signs of meningismus. Cardiovascular: Regular rate and rhythm. No murmurs, gallops, or rubs. 2+ symmetrical distal pulses are present in all extremities. No JVD. Respiratory: Normal respiratory effort. Lungs are clear to auscultation bilaterally. No wheezes, crackles, or rhonchi.  Gastrointestinal: Soft, mild suprapubic ttp, and non distended with positive bowel sounds. No rebound or guarding. Genitourinary: R CVA tenderness. Musculoskeletal: Nontender with normal range of motion in all extremities. No edema, cyanosis, or erythema of extremities. Neurologic: Normal speech and language. Face is symmetric. Moving all extremities. No gross focal neurologic deficits are appreciated. Skin: Skin is warm, dry and intact. No rash noted. Psychiatric: Mood and affect are normal. Speech and behavior are  normal.  ____________________________________________   LABS (all labs ordered are listed, but only abnormal results are displayed)  Labs Reviewed  URINALYSIS, COMPLETE (UACMP) WITH MICROSCOPIC - Abnormal; Notable for the following:       Result Value   Color, Urine YELLOW (*)    APPearance HAZY (*)    Squamous Epithelial / LPF 0-5 (*)    All other components within normal limits  CBC - Abnormal; Notable for the following:    WBC 12.8 (*)    All other components within normal limits  HEPATIC FUNCTION PANEL - Abnormal; Notable for the following:    ALT 13 (*)    All other components within normal limits  URINE CULTURE  BASIC METABOLIC PANEL  LIPASE, BLOOD  POC URINE PREG, ED  POCT PREGNANCY, URINE   ____________________________________________  EKG  none ____________________________________________  RADIOLOGY  CT renal: Nonobstructive stones  in the left kidney. No ureteral stones. No bladder abnormalities identified. 2. No other acute abnormalities. ____________________________________________   PROCEDURES  Procedure(s) performed: None Procedures Critical Care performed:  None ____________________________________________   INITIAL IMPRESSION / ASSESSMENT AND PLAN / ED COURSE  31 y.o. female with a history of kidney stones, ureteral reflux, and recurrent UTIs who presents for evaluation of bilateral flank pain and foul-smelling urine x 2 days. Patient is well-appearing, in no distress, she has mild right flank pain and suprapubic tenderness with no rebound or guarding. Her vitals are within normal limits. Her UA is clean with no evidence of blood or urinary tract infection. Pregnancy test is negative. Blood work including CBC and CMP showing mild leukocytosis with white count of 12.8 which per epic seems to be her baseline. Ct renal showed L renal stones but no ureteral stones. Perc negative. No RUQ ttp and normal LFTs and Tbili. No RLQ ttp. Patient will be dc home on  Pyridium and f/u with PCP.     Pertinent labs & imaging results that were available during my care of the patient were reviewed by me and considered in my medical decision making (see chart for details).    ____________________________________________   FINAL CLINICAL IMPRESSION(S) / ED DIAGNOSES  Final diagnoses:  Right flank pain  Dysuria      NEW MEDICATIONS STARTED DURING THIS VISIT:  New Prescriptions   PHENAZOPYRIDINE (PYRIDIUM) 200 MG TABLET    Take 1 tablet (200 mg total) by mouth 3 (three) times daily as needed for pain.     Note:  This document was prepared using Dragon voice recognition software and may include unintentional dictation errors.    Don PerkingVeronese, [place], MD 05/06/17 1936    Don PerkingVeronese, [place], MD 05/06/17 [phone]

## 2017-05-06 NOTE — ED Triage Notes (Signed)
Pt c/o bilateral flank pain.  Reports has a stone in her left kidney right now. Also has increased urinary frequency with foul odor.  No known fever but c/o chills at times. Ambulatory to triage. NAD. VSS

## 2017-05-07 ENCOUNTER — Emergency Department: Payer: Medicare HMO

## 2017-05-07 ENCOUNTER — Emergency Department
Admission: EM | Admit: 2017-05-07 | Discharge: 2017-05-07 | Disposition: A | Discharge status: Home / Self Care | Payer: Medicare HMO | Attending: Emergency Medicine | Admitting: Emergency Medicine

## 2017-05-07 ENCOUNTER — Encounter: Payer: Self-pay | Admitting: Emergency Medicine

## 2017-05-07 DIAGNOSIS — R1010 Upper abdominal pain, unspecified: Secondary | ICD-10-CM | POA: Diagnosis not present

## 2017-05-07 LAB — CBC WITH DIFFERENTIAL/PLATELET
BASOS ABS: 0.1 10*3/uL (ref 0–0.1)
BASOS PCT: 1 %
Eosinophils Absolute: 0.2 10*3/uL (ref 0–0.7)
Eosinophils Relative: 2 %
HEMATOCRIT: 39.8 % (ref 35.0–47.0)
HEMOGLOBIN: 14 g/dL (ref 12.0–16.0)
Lymphocytes Relative: 20 %
Lymphs Abs: 2.1 10*3/uL (ref 1.0–3.6)
MCH: 28.7 pg (ref 26.0–34.0)
MCHC: 35.2 g/dL (ref 32.0–36.0)
MCV: 81.7 fL (ref 80.0–100.0)
Monocytes Absolute: 0.7 10*3/uL (ref 0.2–0.9)
Monocytes Relative: 7 %
NEUTROS ABS: 7.3 10*3/uL — AB (ref 1.4–6.5)
NEUTROS PCT: 70 %
Platelets: 297 10*3/uL (ref 150–440)
RBC: 4.87 MIL/uL (ref 3.80–5.20)
RDW: 14 % (ref 11.5–14.5)
WBC: 10.4 10*3/uL (ref 3.6–11.0)

## 2017-05-07 LAB — COMPREHENSIVE METABOLIC PANEL
ALK PHOS: 59 U/L (ref 38–126)
ALT: 13 U/L — AB (ref 14–54)
AST: 18 U/L (ref 15–41)
Albumin: 4.1 g/dL (ref 3.5–5.0)
Anion gap: 9 (ref 5–15)
BILIRUBIN TOTAL: 0.5 mg/dL (ref 0.3–1.2)
BUN: 12 mg/dL (ref 6–20)
CO2: 26 mmol/L (ref 22–32)
Calcium: 9.4 mg/dL (ref 8.9–10.3)
Chloride: 102 mmol/L (ref 101–111)
Creatinine, Ser: 0.81 mg/dL (ref 0.44–1.00)
GFR calc Af Amer: >60 mL/min (ref >60)
GFR calc non Af Amer: >60 mL/min (ref >60)
GLUCOSE: 93 mg/dL (ref 65–99)
POTASSIUM: 3.5 mmol/L (ref 3.5–5.1)
Sodium: 137 mmol/L (ref 135–145)
TOTAL PROTEIN: 7.2 g/dL (ref 6.5–8.1)

## 2017-05-07 MED ORDER — CYCLOBENZAPRINE HCL 10 MG PO TABS: 10.0000 mg | ORAL_TABLET | Freq: Three times a day (TID) | ORAL | 0 refills | Status: DC | PRN

## 2017-05-07 MED ORDER — OXYCODONE-ACETAMINOPHEN 5-325 MG PO TABS
2.0000 | ORAL_TABLET | Freq: Once | ORAL | Status: AC
  Administered 2017-05-07: 2 via ORAL
  Filled 2017-05-07: qty 2

## 2017-05-07 MED ORDER — IBUPROFEN 800 MG PO TABS: 800.0000 mg | ORAL_TABLET | Freq: Three times a day (TID) | ORAL | 0 refills | Status: DC | PRN

## 2017-05-07 NOTE — ED Provider Notes (Signed)
[facility] Emergency Department Provider Note       Time seen: ----------------------------------------- 3:11 PM on 05/07/2017 -----------------------------------------     I have reviewed the triage vital signs and the nursing notes.   HISTORY   Chief Complaint Abdominal Pain    HPI Brittney Sanders is a 31 y.o. female who presents to the ED for her flank pain right greater than left. Patient's was seen and treated yesterday. He R for flank pain with a negative workup and negative CT. Patient returns stating she is not feeling any better. Pain is currently on the right side, states she was unable to get the medication that was prescribed yesterday filled because it was $40. Pain is 8 out of 10 in the right flank.   Past Medical History:  Diagnosis Date  . Depression   . Eczema   . Hearing deficit   . Heel spur   . Kidney stone   . Left nephrolithiasis   . Nocturia   . Obesity   . Overweight   . Plantar fasciitis   . Seasonal allergies   . UTI (lower urinary tract infection)     Patient Active Problem List   Diagnosis Date Noted  . Auditory disturbance 12/30/2015  . Adiposity 12/30/2015  . Renal stone 12/17/2015  . Pyelonephritis 12/17/2015  . 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 Calculus of kidney 08/16/2014  . Allergic rhinitis 04/03/2014  . Stress fracture of calcaneus 03/14/2014  . Calcaneal spur 02/23/2014  . Plantar fasciitis 02/23/2014    Past Surgical History:  Procedure Laterality Date  . TUBAL LIGATION    . TYMPANOSTOMY TUBE PLACEMENT Right     Allergies Sulfa antibiotics  Social History Social History  Substance Use Topics  . Smoking status: Never Smoker  . Smokeless tobacco: Never Used  . Alcohol use No    Review of Systems Constitutional: Negative for fever. Cardiovascular: Negative for chest pain. Respiratory: Negative for shortness of breath. Gastrointestinal: Positive for right sided abdominal  pain Genitourinary: Negative for dysuria. Musculoskeletal: Negative for back pain. Skin: Negative for rash. Neurological: Negative for headaches, focal weakness or numbness.  All systems negative/normal/unremarkable except as stated in the HPI  ____________________________________________   PHYSICAL EXAM:  VITAL SIGNS: ED Triage Vitals  Enc Vitals Group     BP 05/07/17 1350 119/72     Pulse Rate 05/07/17 1350 76     Resp 05/07/17 1350 16     Temp 05/07/17 1350 97.8 F (36.6 C)     Temp Source 05/07/17 1350 Oral     SpO2 05/07/17 1350 100 %     Weight 05/07/17 1348 260 lb (117.9 kg)     Height 05/07/17 1348 5\' 3"  (1.6 m)     Head Circumference --      Peak Flow --      Pain Score 05/07/17 1348 7     Pain Loc --      Pain Edu? --      Excl. in GC? --    Constitutional: Alert and oriented. Well appearing and in no distress. Eyes: Conjunctivae are normal. Normal extraocular movements. ENT   Head: Normocephalic and atraumatic.   Nose: No congestion/rhinnorhea.   Mouth/Throat: Mucous membranes are moist.   Neck: No stridor. Cardiovascular: Normal rate, regular rhythm. No murmurs, rubs, or gallops. Respiratory: Normal respiratory effort without tachypnea nor retractions. Breath sounds are clear and equal bilaterally. No wheezes/rales/rhonchi. Gastrointestinal: Right upper quadrant pain, no rebound or guarding. Normal bowel sounds.  Musculoskeletal: Nontender with normal range of motion in extremities. No lower extremity tenderness nor edema. Neurologic:  Normal speech and language. No gross focal neurologic deficits are appreciated.  Skin:  Skin is warm, dry and intact. No rash noted. Psychiatric: Mood and affect are normal. Speech and behavior are normal.  ____________________________________________  ED COURSE:  Pertinent labs & imaging results that were available during my care of the patient were reviewed by me and considered in my medical decision making  (see chart for details). Patient presents for right upper quadrant pain, we will assess with labs and imaging as indicated.   Procedures ____________________________________________   LABS (pertinent positives/negatives)  Labs Reviewed  CBC WITH DIFFERENTIAL/PLATELET - Abnormal; Notable for the following:       Result Value   Neutro Abs 7.3 (*)    All other components within normal limits  COMPREHENSIVE METABOLIC PANEL - Abnormal; Notable for the following:    ALT 13 (*)    All other components within normal limits  URINALYSIS, COMPLETE (UACMP) WITH MICROSCOPIC    RADIOLOGY Images were viewed by me  Right upper quadrant ultrasound IMPRESSION: Normal right upper quadrant ultrasound. ____________________________________________  FINAL ASSESSMENT AND PLAN  Abdominal pain  Plan: Patient's labs and imaging were dictated above. Patient had presented for abdominal pain of unclear etiology. She has now had 2 negative workups in 48 hours. This is likely musculoskeletal in origin or perhaps gas pain and constipation related. Overall she is stable for outpatient follow-up.   Emily FilbertWilliams, Anusha Claus E, MD   Note: This note was generated in part or whole with voice recognition software. Voice recognition is usually quite accurate but there are transcription errors that can and very often do occur. I apologize for any typographical errors that were not detected and corrected.     Emily FilbertWilliams, Kazi Montoro E, MD 05/07/17 [phone]

## 2017-05-07 NOTE — ED Triage Notes (Signed)
C/O bilateral flank pain.  Right greater than left.  Patient seen and treated through ED yesterday, returns stating she is not feeling better.  Unable to fill pyridium RX, due to its $39 cost.

## 2017-05-07 NOTE — ED Notes (Signed)
FIRST NURSE NOTE: States she was seen here yesterday for abd pain, no improvement.

## 2017-05-07 NOTE — ED Notes (Signed)
Pt c/o RLQ abd pain and flank pain. Pt seen yesterday for UTI and unable to fill rx. Pt denies fever, n/v/d. Pt A&OX4

## 2017-05-08 LAB — URINE CULTURE: CULTURE: NO GROWTH

## 2017-05-23 NOTE — Progress Notes (Signed)
05/24/2017 11:08 AM   Brittney Sanders Jun 18, 1986 [id number]  Referring provider: Geryl RankinsBurns, Marianthe M, MD [address] [place], [place] [id number]  Chief Complaint  Patient presents with  . Results    CT    HPI: Patient is a 31 year old Caucasian female who is referred by [facility] ED for nephrolithiasis.  Patient states the onset of the pain was 3 weeks ago.   It was dull.  It lasted for 2 days.  The pain was located in her flanks bilaterally R>L and did not radiate.   She did not have gross hematuria, fevers, chills, nausea or vomiting.    In the ED, she was given Pyridium, Tylenol and Percocet.   Her UA was unremarkable and urine culture was negative.  Serum creatinine was 0.97.  WBC count was 12.8.  CT Renal stone study completed on 05/06/2017 noted a nonobstructive stones in the left kidney. No ureteral stones. No bladder abnormalities identified.  No other acute abnormalities. Imaging studies   I have independently reviewed the films.  She then returned to the ED the next day stating she could not fill the prescriptions given to her and was still in pain.  Abdominal ultrasound was normal.  I have independently reviewed that film.  Today, she is having left side pain.  The pain was a 8/10.  Laying on her back made the pain better.   Laying on her left side made the pain worse.   She does states that she has a curved back.  She has not had fevers, chills, nausea or vomiting.  She is not having urinary symptoms.    She does have a prior history of stones and grade 1 left ureteral reflux.       PMH: Past Medical History:  Diagnosis Date  . Depression   . Eczema   . Hearing deficit   . Heel spur   . Kidney stone   . Left nephrolithiasis   . Nocturia   . Obesity   . Overweight   . Plantar fasciitis   . Seasonal allergies   . UTI (lower urinary tract infection)     Surgical History: Past Surgical History:  Procedure Laterality Date  . TUBAL LIGATION      . TYMPANOSTOMY TUBE PLACEMENT Right     Home Medications:  Allergies as of 05/24/2017      Reactions   Sulfa Antibiotics Hives, Rash      Medication List       Accurate as of 05/24/17 11:08 AM. Always use your most recent med list.          azelastine 0.05 % ophthalmic solution Commonly known as:  OPTIVAR   buPROPion 300 MG 24 hr tablet Commonly known as:  WELLBUTRIN XL Take by mouth.   cephALEXin 500 MG capsule Commonly known as:  KEFLEX Take 1 capsule (500 mg total) by mouth 3 (three) times daily.   cetirizine 10 MG tablet Commonly known as:  ZYRTEC Take 10 mg by mouth daily.   cyclobenzaprine 10 MG tablet Commonly known as:  FLEXERIL Take 1 tablet (10 mg total) by mouth 3 (three) times daily as needed for muscle spasms.   ibuprofen 800 MG tablet Commonly known as:  ADVIL,MOTRIN Take 1 tablet (800 mg total) by mouth every 8 (eight) hours as needed for moderate pain.   ibuprofen 800 MG tablet Commonly known as:  ADVIL,MOTRIN Take 1 tablet (800 mg total) by mouth every 8 (eight)  hours as needed.   Loratadine 10 MG Caps   NALTREXONE HCL PO Take by mouth.   nystatin powder Commonly known as:  MYCOSTATIN/NYSTOP   ondansetron 4 MG disintegrating tablet Commonly known as:  ZOFRAN ODT Take 1 tablet (4 mg total) by mouth every 8 (eight) hours as needed for nausea or vomiting.   oxyCODONE-acetaminophen 5-325 MG tablet Commonly known as:  ROXICET Take 1 tablet by mouth every 4 (four) hours as needed for severe pain.   PARoxetine 40 MG tablet Commonly known as:  PAXIL Take 1 tablet by mouth daily.   phenazopyridine 200 MG tablet Commonly known as:  PYRIDIUM Take 1 tablet (200 mg total) by mouth 3 (three) times daily as needed for pain.   phentermine 37.5 MG tablet Commonly known as:  ADIPEX-P Take 1 tablet by mouth daily.   TOPAMAX SPRINKLE 25 MG capsule Generic drug:  topiramate Take 25 mg by mouth daily.   triamcinolone lotion 0.1 % Commonly known  as:  KENALOG   VENTOLIN HFA 108 (90 Base) MCG/ACT inhaler Generic drug:  albuterol       Allergies:  Allergies  Allergen Reactions  . Sulfa Antibiotics Hives and Rash    Family History: Family History  Problem Relation Age of Onset  . Urolithiasis Maternal Grandmother   . Kidney disease Neg Hx   . Bladder Cancer Neg Hx   . Kidney cancer Neg Hx     Social History:  reports that she has never smoked. She has never used smokeless tobacco. She reports that she does not drink alcohol or use drugs.  ROS: UROLOGY Frequent Urination?: No Hard to postpone urination?: No Burning/pain with urination?: No Get up at night to urinate?: No Leakage of urine?: No Urine stream starts and stops?: No Trouble starting stream?: No Do you have to strain to urinate?: No Blood in urine?: No Urinary tract infection?: No Sexually transmitted disease?: No Injury to kidneys or bladder?: No Painful intercourse?: No Weak stream?: No Currently pregnant?: No Vaginal bleeding?: No Last menstrual period?: n  Gastrointestinal Nausea?: No Vomiting?: No Indigestion/heartburn?: No Diarrhea?: No Constipation?: No  Constitutional Fever: No Night sweats?: No Weight loss?: No Fatigue?: No  Skin Skin rash/lesions?: No Itching?: No  Eyes Blurred vision?: No Double vision?: No  Ears/Nose/Throat Sore throat?: No Sinus problems?: No  Hematologic/Lymphatic Swollen glands?: No Easy bruising?: No  Cardiovascular Leg swelling?: No Chest pain?: No  Respiratory Cough?: No Shortness of breath?: No  Endocrine Excessive thirst?: No  Musculoskeletal Back pain?: No Joint pain?: No  Neurological Headaches?: No Dizziness?: No  Psychologic Depression?: No Anxiety?: No  Physical Exam: BP 126/81   Pulse 75   Ht 5\' 5"  (1.651 m)   Wt 251 lb 12.8 oz (114.2 kg)   LMP 05/10/2017   BMI 41.90 kg/m   Constitutional: Well nourished. Alert and oriented, No acute distress. HEENT: [place]  AT, moist mucus membranes. Trachea midline, no masses. Cardiovascular: No clubbing, cyanosis, or edema. Respiratory: Normal respiratory effort, no increased work of breathing. Skin: No rashes, bruises or suspicious lesions. Lymph: No cervical or inguinal adenopathy. Neurologic: Grossly intact, no focal deficits, moving all 4 extremities. Psychiatric: Normal mood and affect.  Laboratory Data: Lab Results  Component Value Date   WBC 10.4 05/07/2017   HGB 14.0 05/07/2017   HCT 39.8 05/07/2017   MCV 81.7 05/07/2017   PLT 297 05/07/2017    Lab Results  Component Value Date   CREATININE 0.81 05/07/2017     Lab Results  Component Value Date   AST 18 05/07/2017   Lab Results  Component Value Date   ALT 13 (L) 05/07/2017     Pertinent Imaging: CLINICAL DATA:  Bilateral flank pain. Known renal stone on the left. Increased urinary frequency.  EXAM: CT ABDOMEN AND PELVIS WITHOUT CONTRAST  TECHNIQUE: Multidetector CT imaging of the abdomen and pelvis was performed following the standard protocol without IV contrast.  COMPARISON:  February 04, 2017  FINDINGS: Lower chest: No acute abnormality.  Hepatobiliary: No focal liver abnormality is seen. No gallstones, gallbladder wall thickening, or biliary dilatation.  Pancreas: Unremarkable. No pancreatic ductal dilatation or surrounding inflammatory changes.  Spleen: Normal in size without focal abnormality.  Adrenals/Urinary Tract: The adrenal glands are normal. There is a 5 mm stone in the lower pole left kidney on coronal image 98. A punctate stone is seen in the upper pole on image 106. No hydronephrosis or perinephric stranding. No suspicious masses. Regions of cortical thinning in the left kidney are consistent with previous infections or infarcts. No acute perinephric stranding. The ureters and bladder are normal.  Stomach/Bowel: Stomach is within normal limits. Appendix appears normal. No evidence of bowel  wall thickening, distention, or inflammatory changes.  Vascular/Lymphatic: No significant vascular findings are present. No enlarged abdominal or pelvic lymph nodes.  Reproductive: Uterus and bilateral adnexa are unremarkable.  Other: No abdominal wall hernia or abnormality. No abdominopelvic ascites.  Musculoskeletal: No acute or significant osseous findings.  IMPRESSION: 1. Nonobstructive stones in the left kidney. No ureteral stones. No bladder abnormalities identified. 2. No other acute abnormalities.   Electronically Signed   By: Gerome Sam III M.D   On: 05/06/2017 18:28  CLINICAL DATA:  Right upper quadrant pain  EXAM: ULTRASOUND ABDOMEN LIMITED RIGHT UPPER QUADRANT  COMPARISON:  Abdominal CT from yesterday.  FINDINGS: Gallbladder:  No gallstones or wall thickening visualized. No sonographic Murphy sign noted by sonographer.  Common bile duct:  Diameter: 3 mm.  Liver:  No focal lesion identified. Within normal limits in parenchymal echogenicity. Antegrade flow in the imaged portal and hepatic venous system.  IMPRESSION: Normal right upper quadrant ultrasound.   Electronically Signed   By: Marnee Spring M.D.   On: 05/07/2017 16:16   Assessment & Plan:    1. Left side pain  - reassured the patient that it was not urological in origin as the stone was non obstructing and her UA and culture was negative  - most likely MSK in nature - advised her to see her PCP  2. Left renal stone  - It is nonobstructing and unchanged from previous exams  - Monitoring yearly with KUBs  - Advised to contact our office or seek treatment in the ED if becomes febrile or pain/ vomiting are difficult control in order to arrange for emergent/urgent intervention  3. History of vesicular reflux  - UA and urine culture were negative from the emergency room visits  - Patient to contact us with signs or symptoms of a UTI    Return in about 1 year  (around 05/24/2018) for KUB .  These notes generated with voice recognition software. I apologize for typographical errors.  Michiel Cowboy, PA-C  [facility] [address], [address] [place], [place] [address] [phone]

## 2017-05-24 ENCOUNTER — Ambulatory Visit (INDEPENDENT_AMBULATORY_CARE_PROVIDER_SITE_OTHER): Payer: Medicare HMO | Admitting: Urology

## 2017-05-24 ENCOUNTER — Encounter: Payer: Self-pay | Admitting: Urology

## 2017-05-24 VITALS — BP 126/81 | HR 75 | Ht 65.0 in | Wt 251.8 lb

## 2017-05-24 DIAGNOSIS — N200 Calculus of kidney: Secondary | ICD-10-CM

## 2017-05-24 DIAGNOSIS — R109 Unspecified abdominal pain: Secondary | ICD-10-CM

## 2017-05-24 DIAGNOSIS — N1370 Vesicoureteral-reflux, unspecified: Secondary | ICD-10-CM | POA: Diagnosis not present

## 2017-06-14 ENCOUNTER — Ambulatory Visit
Admission: RE | Admit: 2017-06-14 | Discharge: 2017-06-14 | Disposition: A | Discharge status: Home / Self Care | Payer: Medicare HMO | Source: Ambulatory Visit | Attending: Internal Medicine | Admitting: Internal Medicine

## 2017-06-14 ENCOUNTER — Other Ambulatory Visit: Payer: Self-pay | Admitting: Internal Medicine

## 2017-06-14 DIAGNOSIS — M25571 Pain in right ankle and joints of right foot: Secondary | ICD-10-CM

## 2017-06-14 DIAGNOSIS — R937 Abnormal findings on diagnostic imaging of other parts of musculoskeletal system: Secondary | ICD-10-CM | POA: Diagnosis not present

## 2017-07-08 ENCOUNTER — Ambulatory Visit
Admission: RE | Admit: 2017-07-08 | Discharge: 2017-07-08 | Disposition: A | Discharge status: Home / Self Care | Payer: Medicare HMO | Source: Ambulatory Visit | Attending: Internal Medicine | Admitting: Internal Medicine

## 2017-07-08 ENCOUNTER — Other Ambulatory Visit: Payer: Self-pay | Admitting: Internal Medicine

## 2017-07-08 DIAGNOSIS — R0781 Pleurodynia: Secondary | ICD-10-CM

## 2017-09-08 ENCOUNTER — Emergency Department: Payer: Medicare HMO

## 2017-09-08 ENCOUNTER — Encounter: Payer: Self-pay | Admitting: Emergency Medicine

## 2017-09-08 ENCOUNTER — Emergency Department
Admission: EM | Admit: 2017-09-08 | Discharge: 2017-09-08 | Disposition: A | Discharge status: Home / Self Care | Payer: Medicare HMO | Attending: Student in an Organized Health Care Education/Training Program | Admitting: Student in an Organized Health Care Education/Training Program

## 2017-09-08 DIAGNOSIS — Z79899 Other long term (current) drug therapy: Secondary | ICD-10-CM | POA: Diagnosis not present

## 2017-09-08 DIAGNOSIS — N12 Tubulo-interstitial nephritis, not specified as acute or chronic: Secondary | ICD-10-CM | POA: Insufficient documentation

## 2017-09-08 DIAGNOSIS — R079 Chest pain, unspecified: Secondary | ICD-10-CM | POA: Diagnosis present

## 2017-09-08 LAB — BASIC METABOLIC PANEL
ANION GAP: 10 (ref 5–15)
BUN: 9 mg/dL (ref 6–20)
CALCIUM: 9.4 mg/dL (ref 8.9–10.3)
CO2: 24 mmol/L (ref 22–32)
Chloride: 100 mmol/L — ABNORMAL LOW (ref 101–111)
Creatinine, Ser: 1.04 mg/dL — ABNORMAL HIGH (ref 0.44–1.00)
GFR calc Af Amer: >60 mL/min (ref >60)
GLUCOSE: 100 mg/dL — AB (ref 65–99)
Potassium: 3.2 mmol/L — ABNORMAL LOW (ref 3.5–5.1)
Sodium: 134 mmol/L — ABNORMAL LOW (ref 135–145)

## 2017-09-08 LAB — URINALYSIS, COMPLETE (UACMP) WITH MICROSCOPIC
Bilirubin Urine: NEGATIVE
Glucose, UA: NEGATIVE mg/dL
KETONES UR: 5 mg/dL — AB
Nitrite: POSITIVE — AB
PROTEIN: 100 mg/dL — AB
Specific Gravity, Urine: 1.016 (ref 1.005–1.030)
pH: 6 (ref 5.0–8.0)

## 2017-09-08 LAB — CBC
HEMATOCRIT: 42 % (ref 35.0–47.0)
HEMOGLOBIN: 14.3 g/dL (ref 12.0–16.0)
MCH: 28.1 pg (ref 26.0–34.0)
MCHC: 34 g/dL (ref 32.0–36.0)
MCV: 82.6 fL (ref 80.0–100.0)
Platelets: 309 10*3/uL (ref 150–440)
RBC: 5.09 MIL/uL (ref 3.80–5.20)
RDW: 13.9 % (ref 11.5–14.5)
WBC: 14 10*3/uL — ABNORMAL HIGH (ref 3.6–11.0)

## 2017-09-08 LAB — TROPONIN I

## 2017-09-08 LAB — POCT PREGNANCY, URINE: Preg Test, Ur: NEGATIVE

## 2017-09-08 MED ORDER — LIDOCAINE HCL (PF) 1 % IJ SOLN
INTRAMUSCULAR | Status: AC
  Filled 2017-09-08: qty 5

## 2017-09-08 MED ORDER — CEPHALEXIN 500 MG PO CAPS: 500.0000 mg | ORAL_CAPSULE | Freq: Three times a day (TID) | ORAL | 0 refills | Status: AC

## 2017-09-08 MED ORDER — SODIUM CHLORIDE 0.9 % IV BOLUS (SEPSIS)
1000.0000 mL | Freq: Once | INTRAVENOUS | Status: AC
  Administered 2017-09-08: 1000 mL via INTRAVENOUS

## 2017-09-08 MED ORDER — KETOROLAC TROMETHAMINE 30 MG/ML IJ SOLN
15.0000 mg | Freq: Once | INTRAMUSCULAR | Status: AC
  Administered 2017-09-08: 15 mg via INTRAVENOUS
  Filled 2017-09-08: qty 1

## 2017-09-08 MED ORDER — DEXTROSE 5 % IV SOLN
1.0000 g | Freq: Once | INTRAVENOUS | Status: AC
  Administered 2017-09-08: 1 g via INTRAVENOUS
  Filled 2017-09-08 (×2): qty 10

## 2017-09-08 MED ORDER — ACETAMINOPHEN 500 MG PO TABS
1000.0000 mg | ORAL_TABLET | Freq: Once | ORAL | Status: AC
  Administered 2017-09-08: 1000 mg via ORAL
  Filled 2017-09-08 (×2): qty 2

## 2017-09-08 NOTE — ED Provider Notes (Signed)
[facility] Emergency Department Provider Note    First MD Initiated Contact with Patient 09/08/17 1950     (approximate)  I have reviewed the triage vital signs and the nursing notes.   HISTORY  Chief Complaint Chest Pain    HPI Brittney Sanders is a 31 y.o. female with no recent hospitalizations presents with chief complaint of left flank pain chills fever at home and some chest pain as well since he was shortness of breath. Patient states she is having change in her odor of your urine as well as some dysuria. Has had urinary tract infections before. Describes the pain as moderate and burning in nature. Does have a history kidney stones but this feels different. It has not been on any recent antibiotics. No nausea or vomiting.   Past Medical History:  Diagnosis Date  . Depression   . Eczema   . Hearing deficit   . Heel spur   . Kidney stone   . Left nephrolithiasis   . Nocturia   . Obesity   . Overweight   . Plantar fasciitis   . Seasonal allergies   . UTI (lower urinary tract infection)    Family History  Problem Relation Age of Onset  . Urolithiasis Maternal Grandmother   . Kidney disease Neg Hx   . Bladder Cancer Neg Hx   . Kidney cancer Neg Hx    Past Surgical History:  Procedure Laterality Date  . TUBAL LIGATION    . TYMPANOSTOMY TUBE PLACEMENT Right    Patient Active Problem List   Diagnosis Date Noted  . Auditory disturbance 12/30/2015  . Adiposity 12/30/2015  . Renal stone 12/17/2015  . Pyelonephritis 12/17/2015  . 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 Calculus of kidney 08/16/2014  . Allergic rhinitis 04/03/2014  . Stress fracture of calcaneus 03/14/2014  . Calcaneal spur 02/23/2014  . Plantar fasciitis 02/23/2014      Prior to Admission medications   Medication Sig Start Date End Date Taking? Authorizing Provider  azelastine (OPTIVAR) 0.05 % ophthalmic solution  10/16/15   [provider]  buPROPion (WELLBUTRIN XL)  300 MG 24 hr tablet Take by mouth.    [provider]  cephALEXin (KEFLEX) 500 MG capsule Take 1 capsule (500 mg total) by mouth 3 (three) times daily. Patient not taking: Reported on 05/24/2017 02/04/17   Irean Hong, MD  cetirizine (ZYRTEC) 10 MG tablet Take 10 mg by mouth daily.    [provider]  cyclobenzaprine (FLEXERIL) 10 MG tablet Take 1 tablet (10 mg total) by mouth 3 (three) times daily as needed for muscle spasms. Patient not taking: Reported on 05/24/2017 05/07/17   Emily Filbert, MD  ibuprofen (ADVIL,MOTRIN) 800 MG tablet Take 1 tablet (800 mg total) by mouth every 8 (eight) hours as needed for moderate pain. 02/04/17   Irean Hong, MD  ibuprofen (ADVIL,MOTRIN) 800 MG tablet Take 1 tablet (800 mg total) by mouth every 8 (eight) hours as needed. Patient not taking: Reported on 05/24/2017 05/07/17   Emily Filbert, MD  Loratadine 10 MG CAPS  12/01/15   [provider]  NALTREXONE HCL PO Take by mouth.    [provider]  nystatin (MYCOSTATIN/NYSTOP) powder  05/16/16   [provider]  ondansetron (ZOFRAN ODT) 4 MG disintegrating tablet Take 1 tablet (4 mg total) by mouth every 8 (eight) hours as needed for nausea or vomiting. Patient not taking: Reported on 05/24/2017 02/04/17   Chiquita Loth  J, MD  oxyCODONE-acetaminophen (ROXICET) 5-325 MG tablet Take 1 tablet by mouth every 4 (four) hours as needed for severe pain. Patient not taking: Reported on 05/24/2017 02/04/17   Irean Hong, MD  PARoxetine (PAXIL) 40 MG tablet Take 1 tablet by mouth daily. 10/06/15   [provider]  phenazopyridine (PYRIDIUM) 200 MG tablet Take 1 tablet (200 mg total) by mouth 3 (three) times daily as needed for pain. Patient not taking: Reported on 05/24/2017 05/06/17 05/06/18  Nita Sickle, MD  phentermine (ADIPEX-P) 37.5 MG tablet Take 1 tablet by mouth daily. 10/21/15   [provider]  topiramate (TOPAMAX SPRINKLE) 25 MG capsule Take 25 mg by  mouth daily.    [provider]  triamcinolone lotion (KENALOG) 0.1 %  05/18/16   [provider]  VENTOLIN HFA 108 (90 Base) MCG/ACT inhaler  11/26/15   [provider]    Allergies Sulfa antibiotics    Social History Social History  Substance Use Topics  . Smoking status: Never Smoker  . Smokeless tobacco: Never Used  . Alcohol use No    Review of Systems Patient denies headaches, rhinorrhea, blurry vision, numbness, shortness of breath, chest pain, edema, cough, abdominal pain, nausea, vomiting, diarrhea, dysuria, fevers, rashes or hallucinations unless otherwise stated above in HPI. ____________________________________________   PHYSICAL EXAM:  VITAL SIGNS: Vitals:   09/08/17 1808 09/08/17 2228  BP: (!) 145/91 (!) 137/92  Pulse: (!) 119 (!) 104  Resp: 20 19  Temp: 100.3 F (37.9 C) 98.1 F (36.7 C)  SpO2: 100% 97%    Constitutional: Alert and oriented. Well appearing and in no acute distress. Eyes: Conjunctivae are normal.  Head: Atraumatic. Nose: No congestion/rhinnorhea. Mouth/Throat: Mucous membranes are moist.   Neck: No stridor. Painless ROM.  Cardiovascular: Normal rate, regular rhythm. Grossly normal heart sounds.  Good peripheral circulation. Respiratory: Normal respiratory effort.  No retractions. Lungs CTAB. Gastrointestinal: Soft and nontender. No distention. No abdominal bruits. + left CVA tenderness. Genitourinary:  Musculoskeletal: No lower extremity tenderness nor edema.  No joint effusions. Neurologic:  Normal speech and language. No gross focal neurologic deficits are appreciated. No facial droop Skin:  Skin is warm, dry and intact. No rash noted. Psychiatric: Mood and affect are normal. Speech and behavior are normal.  ____________________________________________   LABS (all labs ordered are listed, but only abnormal results are displayed)  Results for orders placed or performed during the hospital encounter of  09/08/17 (from the past 24 hour(s))  Urinalysis, Complete w Microscopic     Status: Abnormal   Collection Time: 09/08/17  6:03 PM  Result Value Ref Range   Color, Urine YELLOW (A) YELLOW   APPearance CLOUDY (A) CLEAR   Specific Gravity, Urine 1.016 1.005 - 1.030   pH 6.0 5.0 - 8.0   Glucose, UA NEGATIVE NEGATIVE mg/dL   Hgb urine dipstick MODERATE (A) NEGATIVE   Bilirubin Urine NEGATIVE NEGATIVE   Ketones, ur 5 (A) NEGATIVE mg/dL   Protein, ur 147 (A) NEGATIVE mg/dL   Nitrite POSITIVE (A) NEGATIVE   Leukocytes, UA LARGE (A) NEGATIVE   RBC / HPF 0-5 0 - 5 RBC/hpf   WBC, UA TOO NUMEROUS TO COUNT 0 - 5 WBC/hpf   Bacteria, UA MANY (A) NONE SEEN   Squamous Epithelial / LPF 0-5 (A) NONE SEEN   Mucus PRESENT   Basic metabolic panel     Status: Abnormal   Collection Time: 09/08/17  6:08 PM  Result Value Ref Range   Sodium  134 (L) 135 - 145 mmol/L   Potassium 3.2 (L) 3.5 - 5.1 mmol/L   Chloride 100 (L) 101 - 111 mmol/L   CO2 24 22 - 32 mmol/L   Glucose, Bld 100 (H) 65 - 99 mg/dL   BUN 9 6 - 20 mg/dL   Creatinine, Ser 1.04 2.13H) 0.44 - 1.00 mg/dL   Calcium 9.4 8.9 - 08.6 mg/dL   GFR calc non Af Amer >60 >60 mL/min   GFR calc Af Amer >60 >60 mL/min   Anion gap 10 5 - 15  CBC     Status: Abnormal   Collection Time: 09/08/17  6:08 PM  Result Value Ref Range   WBC 14.0 (H) 3.6 - 11.0 K/uL   RBC 5.09 3.80 - 5.20 MIL/uL   Hemoglobin 14.3 12.0 - 16.0 g/dL   HCT 57.8 46.9 - 62.9 %   MCV 82.6 80.0 - 100.0 fL   MCH 28.1 26.0 - 34.0 pg   MCHC 34.0 32.0 - 36.0 g/dL   RDW 52.8 41.3 - 24.4 %   Platelets 309 150 - 440 K/uL  Troponin I     Status: None   Collection Time: 09/08/17  6:08 PM  Result Value Ref Range   Troponin I <0.03 <0.03 ng/mL  Pregnancy, urine POC     Status: None   Collection Time: 09/08/17  6:26 PM  Result Value Ref Range   Preg Test, Ur NEGATIVE NEGATIVE   ____________________________________________ ____________________________________________  RADIOLOGY  I  personally reviewed all radiographic images ordered to evaluate for the above acute complaints and reviewed radiology reports and findings.  These findings were personally discussed with the patient.  Please see medical record for radiology report.  ____________________________________________   PROCEDURES  Procedure(s) performed:  Procedures Due to difficulty with obtaining IV access, a 20G peripheral IV catheter was inserted using US guidance into the left arm.  The site was prepped with chlorhexidine and allowed to dry.  The patient tolerated the procedure without any complications.    Critical Care performed: no ____________________________________________   INITIAL IMPRESSION / ASSESSMENT AND PLAN / ED COURSE  Pertinent labs & imaging results that were available during my care of the patient were reviewed by me and considered in my medical decision making (see chart for details).  DDX: pyelo, stone, colitis, msk strain, dehydration, sepsis  Donia M Hartline is a 31 y.o. who presents to the ED with dysuria and flank pain as described above. Patient is a low-grade temperature mild tachycardia, leukocytosis and urinary evidence of UTI. Given her left flank pain this is consistent with pyelonephritis. Her abdominal exam is soft and benign. Ultrasound was ordered to evaluate for any evidence of hydronephrosis to suggest obstructive stone. Ultrasound is reassuring. Patient is given a dose of IV Rocephin for treatment of her pyelonephritis. Heart rate improved with IV fluids. Repeat abdominal exam soft and benign. Patient tolerating oral hydration and able to ambulate with a steady gait. Patient stable for further treatment as an outpatient.      ____________________________________________   FINAL CLINICAL IMPRESSION(S) / ED DIAGNOSES  Final diagnoses:  Pyelonephritis      NEW MEDICATIONS STARTED DURING THIS VISIT:  New Prescriptions   No medications on file     Note:  This  document was prepared using Dragon voice recognition software and may include unintentional dictation errors.    Willy Eddy, MD 09/08/17 [phone]

## 2017-09-08 NOTE — ED Notes (Signed)
Reviewed d/c instructions, follow-up care, prescription with patient. Patient verbalized understanding 

## 2017-09-08 NOTE — ED Triage Notes (Signed)
Pt c/o mid chest pain starting today. It is constant.  Has had some SHOB. Also c/o left flank pain and foul odor to urine starting today. C/o chills but has not checked temp. Ambulatory without distress. Unlabored.

## 2017-09-15 ENCOUNTER — Telehealth: Payer: Self-pay | Admitting: Urology

## 2017-09-15 NOTE — Telephone Encounter (Signed)
Would you call Brittney Sanders and make an appointment for her to see me?  She was recently seen in the emergency room for pyelonephritis and her primary care doctor, Dr. Lawerance BachBurns would like her to be evaluated by us.

## 2017-09-15 NOTE — Telephone Encounter (Signed)
She is coming in tomorrow morning   Brittney DusterMichelle

## 2017-09-16 ENCOUNTER — Ambulatory Visit (INDEPENDENT_AMBULATORY_CARE_PROVIDER_SITE_OTHER): Payer: Medicare HMO | Admitting: Urology

## 2017-09-16 ENCOUNTER — Encounter: Payer: Self-pay | Admitting: Urology

## 2017-09-16 VITALS — BP 129/85 | HR 80 | Ht 63.0 in | Wt 256.9 lb

## 2017-09-16 DIAGNOSIS — N12 Tubulo-interstitial nephritis, not specified as acute or chronic: Secondary | ICD-10-CM | POA: Diagnosis not present

## 2017-09-16 DIAGNOSIS — N200 Calculus of kidney: Secondary | ICD-10-CM | POA: Diagnosis not present

## 2017-09-16 DIAGNOSIS — N1370 Vesicoureteral-reflux, unspecified: Secondary | ICD-10-CM | POA: Diagnosis not present

## 2017-09-16 DIAGNOSIS — R109 Unspecified abdominal pain: Secondary | ICD-10-CM | POA: Diagnosis not present

## 2017-09-16 LAB — URINALYSIS, COMPLETE
Bilirubin, UA: NEGATIVE
GLUCOSE, UA: NEGATIVE
KETONES UA: NEGATIVE
LEUKOCYTES UA: NEGATIVE
Nitrite, UA: NEGATIVE
Protein, UA: NEGATIVE
RBC, UA: NEGATIVE
Specific Gravity, UA: 1.02 (ref 1.005–1.030)
UUROB: 0.2 mg/dL (ref 0.2–1.0)
pH, UA: 7 (ref 5.0–7.5)

## 2017-09-16 NOTE — Progress Notes (Signed)
In and Out Catheterization  Patient is present today for a I & O catheterization due to recurrent uti. Patient was cleaned and prepped in a sterile fashion with betadine and Lidocaine 2% jelly was instilled into the urethra.  A 14FR cath was inserted no complications were noted , 45 ml of urine return was noted, urine was yellow in color. A clean urine sample was collected for urinalysis. Bladder was drained and catheter was removed with out difficulty.    Preformed by: Dallas Schimkeamona Williams CMA

## 2017-09-16 NOTE — Patient Instructions (Signed)

## 2017-09-16 NOTE — Progress Notes (Signed)
09/16/2017 10:58 AM   Brittney Sanders 1986/07/01 [id number]  Referring provider: Almira Bar, MD [address] [place], [place] [address]  Chief Complaint  Patient presents with  . Follow-up    ER  pyelonephritis    HPI: 31 yo WF who presents today for follow up for pyelonephritis.    Patient was seen at [facility] emergency department on 09/10/2017 with the complaints of left flank pain, chills and fevers.  She was febrile.  Her UA was nitrite positive, TNTC WBC's and many bacteria.  No culture was performed.  Her creatinine was 1.04.  Her WBC count was 14.0.   RUS performed 09/08/2017 no evidence of hydronephrosis of the left or right kidney.  Small left-sided nonobstructive stone or calcifications measures 6 mm.  She was given Rocephin 1 gram IM and Keflex 500 mg, bid.    Today, she is not having any symptoms.  She finished her antibiotic this morning.  She is frustrated with the recurrent urinary tract symptoms and what she considers are infections.  Her catheterized UA today was negative.    She does have a prior history of stones and grade 1 left ureteral reflux.       PMH: Past Medical History:  Diagnosis Date  . Depression   . Eczema   . Hearing deficit   . Heel spur   . Kidney stone   . Left nephrolithiasis   . Nocturia   . Obesity   . Overweight   . Plantar fasciitis   . Seasonal allergies   . UTI (lower urinary tract infection)     Surgical History: Past Surgical History:  Procedure Laterality Date  . TUBAL LIGATION    . TYMPANOSTOMY TUBE PLACEMENT Right     Home Medications:  Allergies as of 09/16/2017      Reactions   Sulfa Antibiotics Hives, Rash      Medication List       Accurate as of 09/16/17 10:58 AM. Always use your most recent med list.          Acetaminophen 80 MG/0.8ML oral soluiton acetaminophen 80 mg   azelastine 0.05 % ophthalmic solution Commonly known as:  OPTIVAR   betamethasone dipropionate  0.05 % ointment Commonly known as:  DIPROLENE betamethasone dipropionate 0.05 % topical ointment   buPROPion 300 MG 24 hr tablet Commonly known as:  WELLBUTRIN XL Take by mouth.   cephALEXin 500 MG capsule Commonly known as:  KEFLEX Take 1 capsule (500 mg total) by mouth 3 (three) times daily.   cetirizine 10 MG tablet Commonly known as:  ZYRTEC Take 10 mg by mouth daily.   cyclobenzaprine 10 MG tablet Commonly known as:  FLEXERIL Take 1 tablet (10 mg total) by mouth 3 (three) times daily as needed for muscle spasms.   ibuprofen 800 MG tablet Commonly known as:  ADVIL,MOTRIN Take 1 tablet (800 mg total) by mouth every 8 (eight) hours as needed for moderate pain.   ibuprofen 800 MG tablet Commonly known as:  ADVIL,MOTRIN Take 1 tablet (800 mg total) by mouth every 8 (eight) hours as needed.   Loratadine 10 MG Caps   MOBIC 15 MG tablet Generic drug:  meloxicam Mobic 15 mg tablet  Take 1 tablet every day by oral route.   mometasone 0.1 % cream Commonly known as:  ELOCON mometasone 0.1 % topical cream   NALTREXONE HCL PO Take by mouth.   nystatin powder Commonly known as:  MYCOSTATIN/NYSTOP  ondansetron 4 MG disintegrating tablet Commonly known as:  ZOFRAN ODT Take 1 tablet (4 mg total) by mouth every 8 (eight) hours as needed for nausea or vomiting.   oxyCODONE-acetaminophen 5-325 MG tablet Commonly known as:  ROXICET Take 1 tablet by mouth every 4 (four) hours as needed for severe pain.   PARoxetine 40 MG tablet Commonly known as:  PAXIL Take 1 tablet by mouth daily.   phenazopyridine 200 MG tablet Commonly known as:  PYRIDIUM Take 1 tablet (200 mg total) by mouth 3 (three) times daily as needed for pain.   phentermine 37.5 MG tablet Commonly known as:  ADIPEX-P Take 1 tablet by mouth daily.   TOPAMAX SPRINKLE 25 MG capsule Generic drug:  topiramate Take 25 mg by mouth daily.   triamcinolone lotion 0.1 % Commonly known as:  KENALOG   VENTOLIN HFA  108 (90 Base) MCG/ACT inhaler Generic drug:  albuterol   Zinc Oxide 10 % Oint diaper rash ointment - 60 gm       Allergies:  Allergies  Allergen Reactions  . Sulfa Antibiotics Hives and Rash    Family History: Family History  Problem Relation Age of Onset  . Urolithiasis Maternal Grandmother   . Kidney disease Neg Hx   . Bladder Cancer Neg Hx   . Kidney cancer Neg Hx     Social History:  reports that she has never smoked. She has never used smokeless tobacco. She reports that she does not drink alcohol or use drugs.  ROS: UROLOGY Frequent Urination?: No Hard to postpone urination?: No Burning/pain with urination?: No Get up at night to urinate?: No Leakage of urine?: No Urine stream starts and stops?: No Trouble starting stream?: No Do you have to strain to urinate?: No Blood in urine?: No Urinary tract infection?: No Sexually transmitted disease?: No Injury to kidneys or bladder?: No Painful intercourse?: No Weak stream?: No Currently pregnant?: No Vaginal bleeding?: No Last menstrual period?: n  Gastrointestinal Nausea?: No Vomiting?: No Indigestion/heartburn?: No Diarrhea?: No Constipation?: No  Constitutional Fever: No Night sweats?: No Weight loss?: No Fatigue?: No  Skin Skin rash/lesions?: No Itching?: No  Eyes Blurred vision?: No Double vision?: No  Ears/Nose/Throat Sore throat?: No Sinus problems?: No  Hematologic/Lymphatic Swollen glands?: No Easy bruising?: No  Cardiovascular Leg swelling?: No Chest pain?: No  Respiratory Cough?: No Shortness of breath?: No  Endocrine Excessive thirst?: No  Musculoskeletal Back pain?: No Joint pain?: No  Neurological Headaches?: No Dizziness?: No  Psychologic Depression?: No Anxiety?: No  Physical Exam: BP 129/85   Pulse 80   Ht '5\' 3"'$  (1.6 m)   Wt 256 lb 14.4 oz (116.5 kg)   LMP 09/03/2017   BMI 45.51 kg/m   Constitutional: Well nourished. Alert and oriented, No acute  distress. HEENT: [place] AT, moist mucus membranes. Trachea midline, no masses. Cardiovascular: No clubbing, cyanosis, or edema. Respiratory: Normal respiratory effort, no increased work of breathing. Skin: No rashes, bruises or suspicious lesions. Lymph: No cervical or inguinal adenopathy. Neurologic: Grossly intact, no focal deficits, moving all 4 extremities. Psychiatric: Normal mood and affect.  Laboratory Data: Lab Results  Component Value Date   WBC 14.0 (H) 09/08/2017   HGB 14.3 09/08/2017   HCT 42.0 09/08/2017   MCV 82.6 09/08/2017   PLT 309 09/08/2017    Lab Results  Component Value Date   CREATININE 1.04 (H) 09/08/2017     Lab Results  Component Value Date   AST 18 05/07/2017   Lab Results  Component Value Date   ALT 13 (L) 05/07/2017   I have reviewed the labs.  Pertinent Imaging: CLINICAL DATA:  31 year old female with a history of left flank pain  EXAM: RENAL / URINARY TRACT ULTRASOUND COMPLETE  COMPARISON:  05/07/2017  FINDINGS: Right Kidney:  Length: 11.3 cm. No right-sided hydronephrosis. Flow confirmed in the hilum the right kidney. Echogenicity similar to that of the adjacent liver.  Left Kidney:  Length: 7.5 cm. Echogenicity left renal cortex similar to that of the adjacent spleen. Flow confirmed in the hilum of the left kidney. No hydronephrosis. Hyperechoic focus at the corticomedullary junction of the left upper pole measures 6 mm compatible with nonobstructive stone/calcification.  Bladder:  Appears normal for degree of bladder distention.  IMPRESSION: No evidence of hydronephrosis of the left or right kidney.  Small left-sided nonobstructive stone or calcifications measures 6 mm.   Electronically Signed   By: Corrie Mckusick D.O.   On: 09/08/2017 21:46  I have independently reviewed the films.     Assessment & Plan:    1. History of UTI symptoms  - criteria for recurrent UTI has not been met with 2 or more  infections in 6 months or 3 or greater infections in one year   - I discussed with the patient that her situation is complicated as we do not have any documented positive urine cultures to establish the diagnosis of recurrent urinary tract infections  - We can take steps to help prevent urinary tract infections such as, increase water intake until the urine is pale yellow or clear (10 to 12 cups daily), take probiotics (yogurt, oral pills or vaginal suppositories), take cranberry pills or drink the juice and Vitamin C 1,000 mg daily to acidify the urine should be added to their daily regimen, if using tampons, she should remove them prior to urinating and change them often, avoid soaking in tubs and wipe front to back after urinating and weight loss  - I explained to her that Dr. Rebeca Alert and I had a discussion with Dr. Quay Burow will be performing routine urine cultures on her in an effort to establish whether or not she is truly having urinary tract infections that are continuing to her symptoms  - I also stated that we too are available when she should experience symptoms of what she believes is a UTI and she can seek treatment with [place] as well and that if she did come to our office we would communicate those results with Dr. Quay Burow                2. Left side pain  - reassured the patient that it was not urological in origin as the stone was non obstructing - did not advise her to undergo ureteroscopy at this time for that left renal stone and explained that ESWL would likely not be successful due to the stone being in the lower pole of her kidney  - most likely MSK in nature - encouraged her to undergo physical therapy  3. Left renal stone  - It is nonobstructing and unchanged from previous exams  - Monitoring yearly with [facility]  - Advised to contact our office or seek treatment in the ED if becomes febrile or pain/ vomiting are difficult control in order to arrange for emergent/urgent intervention  4.  History of vesicular reflux  - UA and urine culture were negative from the emergency room visits  - Patient to contact us with signs or symptoms of a  UTI    Return in about 3 months (around 12/17/2017) for OAB questionnaire, PVR and exam.  These notes generated with voice recognition software. I apologize for typographical errors.  Zara Council, [facility] [address], [place], [place] [address] [phone]

## 2017-12-19 NOTE — Progress Notes (Signed)
12/20/2017 8:34 AM   Brittney Sanders [birth year] [id number]  Referring provider: Geryl Rankins, MD [address] [place], [place] [address]  Chief Complaint  Patient presents with  . Other    OAB    HPI: 32 yo WF with a history of pyelonephritis, recurrent UTI's and a left renal stone who presents today for a three month follow up.    History of pyelonephritis 32 yo WF who presents today for follow up for pyelonephritis.  Patient was seen at [facility] emergency department on 09/10/2017 with the complaints of left flank pain, chills and fevers.  She was febrile.  Her UA was nitrite positive, TNTC WBC's and many bacteria.  No culture was performed.  Her creatinine was 1.04.  Her WBC count was 14.0.   RUS performed 09/08/2017 no evidence of hydronephrosis of the left or right kidney.  Small left-sided nonobstructive stone or calcifications measures 6 mm.  She was given Rocephin 1 gram IM and Keflex 500 mg, bid.   She has Grade 1 left ureteral reflux.    History of recurrent UTI's Patient is drinking 1.5 L of water daily, taking Vitamin C 1000 mg daily, taking probiotics, drinking 6 ounces of cranberry juice daily/taking cranberry tablets daily and avoiding tub baths.  She is not voiding before and after sex.  She has not had an UTI since her last visit with [place].    The patient has been experiencing urgency x 0-3, frequency x 4-7, not restricting fluids to avoid visits to the restroom, is engaging in toilet mapping, incontinence x 0-3 and nocturia x 0-3.   She is not having gross hematuria, dysuria or suprapubic pain.  She fevers, chills, nausea or vomiting.      PMH: Past Medical History:  Diagnosis Date  . Depression   . Eczema   . Hearing deficit   . Heel spur   . Kidney stone   . Left nephrolithiasis   . Nocturia   . Obesity   . Overweight   . Plantar fasciitis   . Seasonal allergies   . UTI (lower urinary tract infection)     Surgical History: Past  Surgical History:  Procedure Laterality Date  . TUBAL LIGATION    . TYMPANOSTOMY TUBE PLACEMENT Right     Home Medications:  Allergies as of 12/20/2017      Reactions   Sulfa Antibiotics Hives, Rash      Medication List        Accurate as of 12/20/17  8:34 AM. Always use your most recent med list.          Acetaminophen 80 MG/0.8ML oral soluiton acetaminophen 80 mg   azelastine 0.05 % ophthalmic solution Commonly known as:  OPTIVAR   betamethasone dipropionate 0.05 % ointment Commonly known as:  DIPROLENE betamethasone dipropionate 0.05 % topical ointment   cetirizine 10 MG tablet Commonly known as:  ZYRTEC Take 10 mg by mouth daily.   ibuprofen 800 MG tablet Commonly known as:  ADVIL,MOTRIN Take 1 tablet (800 mg total) by mouth every 8 (eight) hours as needed for moderate pain.   Loratadine 10 MG Caps   mometasone 0.1 % cream Commonly known as:  ELOCON mometasone 0.1 % topical cream   NALTREXONE HCL PO Take by mouth.   nystatin powder Commonly known as:  MYCOSTATIN/NYSTOP   oxyCODONE-acetaminophen 5-325 MG tablet Commonly known as:  ROXICET Take 1 tablet by mouth every 4 (four) hours as needed for severe pain.  PARoxetine 40 MG tablet Commonly known as:  PAXIL Take 1 tablet by mouth daily.   phenazopyridine 200 MG tablet Commonly known as:  PYRIDIUM Take 1 tablet (200 mg total) by mouth 3 (three) times daily as needed for pain.   phentermine 37.5 MG tablet Commonly known as:  ADIPEX-P Take 1 tablet by mouth daily.   TOPAMAX SPRINKLE 25 MG capsule Generic drug:  topiramate Take 25 mg by mouth daily.   triamcinolone lotion 0.1 % Commonly known as:  KENALOG   VENTOLIN HFA 108 (90 Base) MCG/ACT inhaler Generic drug:  albuterol   Zinc Oxide 10 % Oint diaper rash ointment - 60 gm       Allergies:  Allergies  Allergen Reactions  . Sulfa Antibiotics Hives and Rash    Family History: Family History  Problem Relation Age of Onset  .  Urolithiasis Maternal Grandmother   . Kidney disease Neg Hx   . Bladder Cancer Neg Hx   . Kidney cancer Neg Hx     Social History:  reports that  has never smoked. she has never used smokeless tobacco. She reports that she does not drink alcohol or use drugs.  ROS: UROLOGY Frequent Urination?: No Hard to postpone urination?: No Burning/pain with urination?: No Get up at night to urinate?: No Leakage of urine?: No Urine stream starts and stops?: No Trouble starting stream?: No Do you have to strain to urinate?: No Blood in urine?: No Urinary tract infection?: No Sexually transmitted disease?: No Injury to kidneys or bladder?: No Painful intercourse?: No Weak stream?: No Currently pregnant?: No Vaginal bleeding?: No Last menstrual period?: n  Gastrointestinal Nausea?: No Vomiting?: No Indigestion/heartburn?: No Diarrhea?: No Constipation?: No  Constitutional Fever: No Night sweats?: No Fatigue?: No  Skin Skin rash/lesions?: No Itching?: No  Eyes Blurred vision?: No Double vision?: No  Ears/Nose/Throat Sore throat?: No Sinus problems?: No  Hematologic/Lymphatic Swollen glands?: No Easy bruising?: No  Cardiovascular Leg swelling?: No Chest pain?: No  Respiratory Cough?: No Shortness of breath?: No  Endocrine Excessive thirst?: No  Musculoskeletal Back pain?: No Joint pain?: No  Neurological Headaches?: No Dizziness?: No  Psychologic Depression?: No Anxiety?: No  Physical Exam: BP (!) 149/86   Pulse 75   Ht 5\' 3"  (1.6 m)   Wt 272 lb (123.4 kg)   BMI 48.18 kg/m   Constitutional: Well nourished. Alert and oriented, No acute distress. HEENT: [place] AT, moist mucus membranes. Trachea midline, no masses. Cardiovascular: No clubbing, cyanosis, or edema. Respiratory: Normal respiratory effort, no increased work of breathing. Skin: No rashes, bruises or suspicious lesions. Lymph: No cervical or inguinal adenopathy. Neurologic: Grossly  intact, no focal deficits, moving all 4 extremities. Psychiatric: Normal mood and affect.  Laboratory Data: Lab Results  Component Value Date   WBC 14.0 (H) 09/08/2017   HGB 14.3 09/08/2017   HCT 42.0 09/08/2017   MCV 82.6 09/08/2017   PLT 309 09/08/2017    Lab Results  Component Value Date   CREATININE 1.04 (H) 09/08/2017     Lab Results  Component Value Date   AST 18 05/07/2017   Lab Results  Component Value Date   ALT 13 (L) 05/07/2017   I have reviewed the labs.  Pertinent Imaging: CLINICAL DATA:  32 year old female with a history of left flank pain  EXAM: RENAL / URINARY TRACT ULTRASOUND COMPLETE  COMPARISON:  05/07/2017  FINDINGS: Right Kidney:  Length: 11.3 cm. No right-sided hydronephrosis. Flow confirmed in the hilum the right kidney. Echogenicity  similar to that of the adjacent liver.  Left Kidney:  Length: 7.5 cm. Echogenicity left renal cortex similar to that of the adjacent spleen. Flow confirmed in the hilum of the left kidney. No hydronephrosis. Hyperechoic focus at the corticomedullary junction of the left upper pole measures 6 mm compatible with nonobstructive stone/calcification.  Bladder:  Appears normal for degree of bladder distention.  IMPRESSION: No evidence of hydronephrosis of the left or right kidney.  Small left-sided nonobstructive stone or calcifications measures 6 mm.   Electronically Signed   By: Gilmer Mor D.O.   On: 09/08/2017 21:46   Assessment & Plan:    1. History of recurrent UTI's  - no UTI's since last visit  - reviewed UTI prevention strategies                 2. Left renal stone  - It is nonobstructing and unchanged from previous exams  - Monitoring yearly with [facility] (08/2018)  - Advised to contact our office or seek treatment in the ED if becomes febrile or pain/ vomiting are difficult control in order to arrange for emergent/urgent intervention  3. History of vesicular reflux  - UA  and urine culture were negative from the emergency room visits  - Patient to contact us with signs or symptoms of a UTI    Return in about 9 months (around 09/19/2018) for OAB questionnaire, PVR and exam.  These notes generated with voice recognition software. I apologize for typographical errors.  Michiel Cowboy, PA-C  [facility] [address] [place], [place] [address] [phone]

## 2017-12-20 ENCOUNTER — Ambulatory Visit: Payer: Medicare HMO | Admitting: Urology

## 2017-12-20 ENCOUNTER — Encounter: Payer: Self-pay | Admitting: Urology

## 2017-12-20 VITALS — BP 149/86 | HR 75 | Ht 63.0 in | Wt 272.0 lb

## 2017-12-20 DIAGNOSIS — N200 Calculus of kidney: Secondary | ICD-10-CM

## 2017-12-20 DIAGNOSIS — N1370 Vesicoureteral-reflux, unspecified: Secondary | ICD-10-CM

## 2017-12-20 DIAGNOSIS — Z87440 Personal history of urinary (tract) infections: Secondary | ICD-10-CM

## 2017-12-22 ENCOUNTER — Other Ambulatory Visit: Payer: Self-pay

## 2017-12-22 ENCOUNTER — Emergency Department
Admission: EM | Admit: 2017-12-22 | Discharge: 2017-12-22 | Disposition: A | Discharge status: Home / Self Care | Payer: Medicare HMO | Attending: Emergency Medicine | Admitting: Emergency Medicine

## 2017-12-22 DIAGNOSIS — R1032 Left lower quadrant pain: Secondary | ICD-10-CM | POA: Insufficient documentation

## 2017-12-22 DIAGNOSIS — R1031 Right lower quadrant pain: Secondary | ICD-10-CM | POA: Insufficient documentation

## 2017-12-22 DIAGNOSIS — Z87442 Personal history of urinary calculi: Secondary | ICD-10-CM | POA: Insufficient documentation

## 2017-12-22 DIAGNOSIS — Z79899 Other long term (current) drug therapy: Secondary | ICD-10-CM | POA: Diagnosis not present

## 2017-12-22 DIAGNOSIS — R109 Unspecified abdominal pain: Secondary | ICD-10-CM

## 2017-12-22 LAB — URINALYSIS, COMPLETE (UACMP) WITH MICROSCOPIC
Bacteria, UA: NONE SEEN
Bilirubin Urine: NEGATIVE
GLUCOSE, UA: NEGATIVE mg/dL
Hgb urine dipstick: NEGATIVE
KETONES UR: 20 mg/dL — AB
NITRITE: NEGATIVE
PH: 6 (ref 5.0–8.0)
Protein, ur: NEGATIVE mg/dL
SPECIFIC GRAVITY, URINE: 1.023 (ref 1.005–1.030)

## 2017-12-22 LAB — POCT PREGNANCY, URINE: Preg Test, Ur: NEGATIVE

## 2017-12-22 MED ORDER — ONDANSETRON HCL 4 MG PO TABS: 4.0000 mg | ORAL_TABLET | Freq: Three times a day (TID) | ORAL | 0 refills | Status: DC | PRN

## 2017-12-22 MED ORDER — DICYCLOMINE HCL 20 MG PO TABS: 20.0000 mg | ORAL_TABLET | Freq: Three times a day (TID) | ORAL | 0 refills | Status: DC | PRN

## 2017-12-22 NOTE — Discharge Instructions (Signed)
Please seek medical attention for any high fevers, chest pain, shortness of breath, change in behavior, persistent vomiting, bloody stool or any other new or concerning symptoms.  

## 2017-12-22 NOTE — ED Notes (Signed)

## 2017-12-22 NOTE — ED Provider Notes (Signed)
[facility] Emergency Department Provider Note   ____________________________________________   I have reviewed the triage vital signs and the nursing notes.   HISTORY  Chief Complaint Flank Pain   History limited by: Not Limited   HPI Brittney Sanders is a 32 y.o. female who presents to the emergency department today because of concern for flank pain. She states it is located in bilateral flanks. It will move from one side to the other. She denies any radiation of the pain down into her groin or up into her chest. She denies any dysuria. Denies any discoloration to her urine. States she does have a history of UTIs and kidney stones. Also states that one of her daughters has been sick with the flu recently.     Per medical record review patient has a history of kidney stone  Past Medical History:  Diagnosis Date  . Depression   . Eczema   . Hearing deficit   . Heel spur   . Kidney stone   . Left nephrolithiasis   . Nocturia   . Obesity   . Overweight   . Plantar fasciitis   . Seasonal allergies   . UTI (lower urinary tract infection)     Patient Active Problem List   Diagnosis Date Noted  . Auditory disturbance 12/30/2015  . Adiposity 12/30/2015  . Renal stone 12/17/2015  . Pyelonephritis 12/17/2015  . 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 Calculus of kidney 08/16/2014  . Allergic rhinitis 04/03/2014  . Stress fracture of calcaneus 03/14/2014  . Calcaneal spur 02/23/2014  . Plantar fasciitis 02/23/2014    Past Surgical History:  Procedure Laterality Date  . TUBAL LIGATION    . TYMPANOSTOMY TUBE PLACEMENT Right     Prior to Admission medications   Medication Sig Start Date End Date Taking? Authorizing Provider  Acetaminophen 80 MG/0.8ML oral soluiton acetaminophen 80 mg    [provider]  azelastine (OPTIVAR) 0.05 % ophthalmic solution  10/16/15   [provider]  betamethasone dipropionate (DIPROLENE) 0.05 % ointment  betamethasone dipropionate 0.05 % topical ointment    [provider]  cetirizine (ZYRTEC) 10 MG tablet Take 10 mg by mouth daily.    [provider]  ibuprofen (ADVIL,MOTRIN) 800 MG tablet Take 1 tablet (800 mg total) by mouth every 8 (eight) hours as needed for moderate pain. 02/04/17   Irean Hong, MD  Loratadine 10 MG CAPS  12/01/15   [provider]  mometasone (ELOCON) 0.1 % cream mometasone 0.1 % topical cream    [provider]  NALTREXONE HCL PO Take by mouth.    [provider]  nystatin (MYCOSTATIN/NYSTOP) powder  05/16/16   [provider]  oxyCODONE-acetaminophen (ROXICET) 5-325 MG tablet Take 1 tablet by mouth every 4 (four) hours as needed for severe pain. 02/04/17   Irean Hong, MD  PARoxetine (PAXIL) 40 MG tablet Take 1 tablet by mouth daily. 10/06/15   [provider]  phenazopyridine (PYRIDIUM) 200 MG tablet Take 1 tablet (200 mg total) by mouth 3 (three) times daily as needed for pain. 05/06/17 05/06/18  Nita Sickle, MD  phentermine (ADIPEX-P) 37.5 MG tablet Take 1 tablet by mouth daily. 10/21/15   [provider]  topiramate (TOPAMAX SPRINKLE) 25 MG capsule Take 25 mg by mouth daily.    [provider]  triamcinolone lotion (KENALOG) 0.1 %  05/18/16   [provider]  VENTOLIN HFA 108 (90 Base) MCG/ACT inhaler  11/26/15  [provider]  Zinc Oxide 10 % OINT diaper rash ointment - 60 gm    [provider]    Allergies Sulfa antibiotics  Family History  Problem Relation Age of Onset  . Urolithiasis Maternal Grandmother   . Kidney disease Neg Hx   . Bladder Cancer Neg Hx   . Kidney cancer Neg Hx     Social History Social History   Tobacco Use  . Smoking status: Never Smoker  . Smokeless tobacco: Never Used  Substance Use Topics  . Alcohol use: No    Alcohol/week: 0.0 oz  . Drug use: No    Review of Systems Constitutional: No fever/chills Eyes: No visual  changes. ENT: No sore throat. Cardiovascular: Denies chest pain. Respiratory: Denies shortness of breath. Gastrointestinal: Bilateral flank pain. Genitourinary: Negative for dysuria. Musculoskeletal: Negative for back pain. Skin: Negative for rash. Neurological: Negative for headaches, focal weakness or numbness.  ____________________________________________   PHYSICAL EXAM:  VITAL SIGNS: ED Triage Vitals  Enc Vitals Group     BP 12/22/17 1828 (!) 135/98     Pulse Rate 12/22/17 1828 100     Resp 12/22/17 1828 18     Temp 12/22/17 1828 97.8 F (36.6 C)     Temp Source 12/22/17 1828 Oral     SpO2 12/22/17 1828 96 %     Weight 12/22/17 1828 271 lb (122.9 kg)     Height 12/22/17 1828 5\' 3"  (1.6 m)     Head Circumference --      Peak Flow --      Pain Score 12/22/17 1833 6   Constitutional: Alert and oriented. Well appearing and in no distress. Eyes: Conjunctivae are normal.  ENT   Head: Normocephalic and atraumatic.   Nose: No congestion/rhinnorhea.   Mouth/Throat: Mucous membranes are moist.   Neck: No stridor. Hematological/Lymphatic/Immunilogical: No cervical lymphadenopathy. Cardiovascular: Normal rate, regular rhythm.  No murmurs, rubs, or gallops.  Respiratory: Normal respiratory effort without tachypnea nor retractions. Breath sounds are clear and equal bilaterally. No wheezes/rales/rhonchi. Gastrointestinal: Soft and non tender. No rebound. No guarding.  Genitourinary: Deferred Musculoskeletal: Normal range of motion in all extremities. No lower extremity edema. Neurologic:  Normal speech and language. No gross focal neurologic deficits are appreciated.  Skin:  Skin is warm, dry and intact. No rash noted. Psychiatric: Mood and affect are normal. Speech and behavior are normal. Patient exhibits appropriate insight and judgment.  ____________________________________________    LABS (pertinent positives/negatives)  Upreg negative UA wbc 0-5,  leukocytes moderate, bacteria none seen, nitrite negative ____________________________________________   EKG  None  ____________________________________________    RADIOLOGY  None  ____________________________________________   PROCEDURES  Procedures  ____________________________________________   INITIAL IMPRESSION / ASSESSMENT AND PLAN / ED COURSE  Pertinent labs & imaging results that were available during my care of the patient were reviewed by me and considered in my medical decision making (see chart for details).  Patient presented to the emergency department today for bilateral flank pain.  The way she describes it does move from one side to the other.  Patient not complaining of any dysuria or discoloration of urine however given history of UTIs urine checked.  This was not overwhelming for infection.  I had a discussion with the patient.  I did say that we would send it off for urine culture.  Given that her daughter has been sick with a stomach bug I do think this is perhaps more likely.  Will give patient symptomatic treatment.  ____________________________________________   FINAL CLINICAL IMPRESSION(S) / ED DIAGNOSES  Final diagnoses:  Flank pain     Note: This dictation was prepared with Dragon dictation. Any transcriptional errors that result from this process are unintentional     Phineas Semen, MD 12/22/17 2203

## 2017-12-22 NOTE — ED Triage Notes (Addendum)
Pt to ER c/o bilateral flank pain. States hx of frequent UTI and kidney infectios. Denies pain with urination. Pt states pain to flanks started at 1600 today. Pt alert and oriented X4, active, cooperative, pt in NAD. RR even and unlabored, color WNL.

## 2017-12-22 NOTE — ED Notes (Signed)
Pt reports side pain "that jump from side to side" that began today. Pt states she has hx of kidney stones and infections, denies painful urination, change to urine color or smell or fevers.

## 2017-12-24 LAB — URINE CULTURE

## 2018-01-10 ENCOUNTER — Ambulatory Visit: Payer: Medicare HMO | Admitting: Urology

## 2018-05-06 IMAGING — CT CT RENAL STONE PROTOCOL
2 of 4 series · 17 of 46 positions shown, 19 images · non-contrast
Comparison: February 04, 2017

CLINICAL DATA: Bilateral flank pain. Known renal stone on the left.
Increased urinary frequency.

EXAM:
CT ABDOMEN AND PELVIS WITHOUT CONTRAST
TECHNIQUE: Multidetector CT imaging of the abdomen and pelvis was performed
following the standard protocol without IV contrast.

[Series 2: stone full standard · axial · 0.94mm/px · z∈[-354,+86]mm · 14 of 98 slices shown, 16 images]
[im 5/98  soft-tissue]
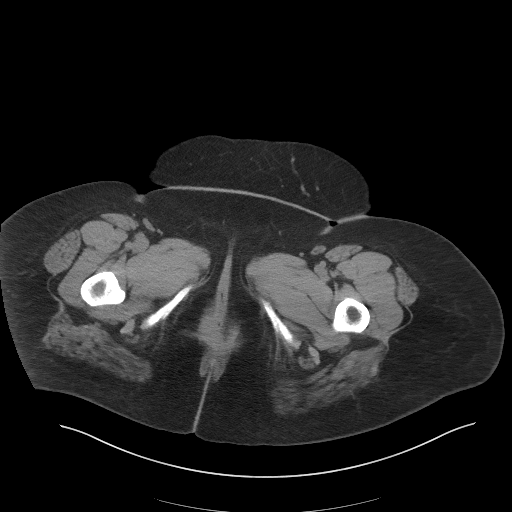
[im 5/98  bone]
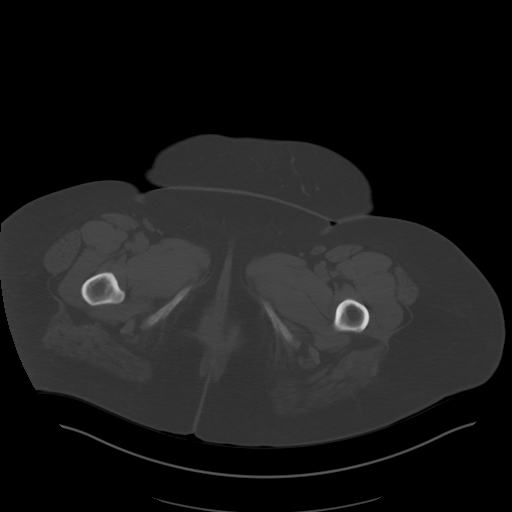
[im 13/98  soft-tissue]
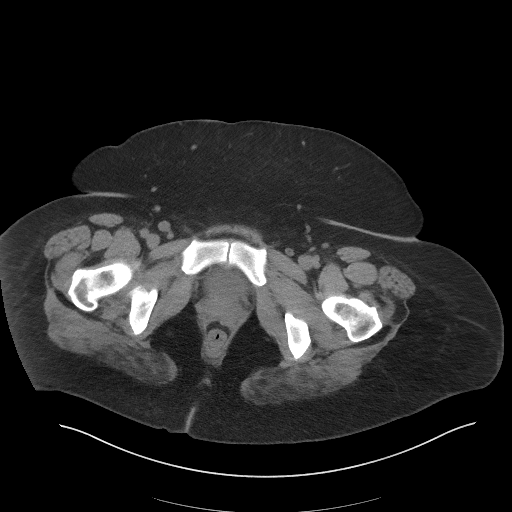
[im 21/98  soft-tissue]
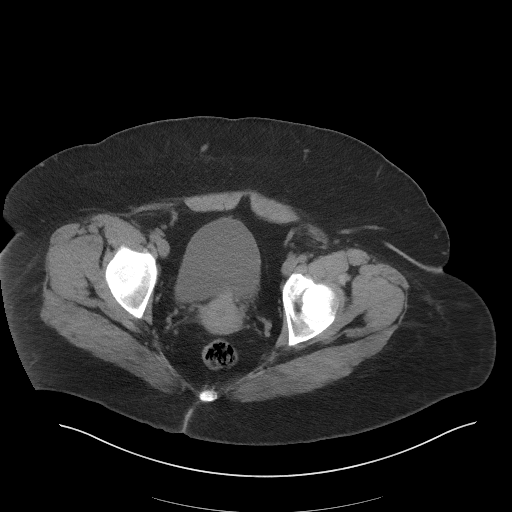
[im 25/98  soft-tissue]
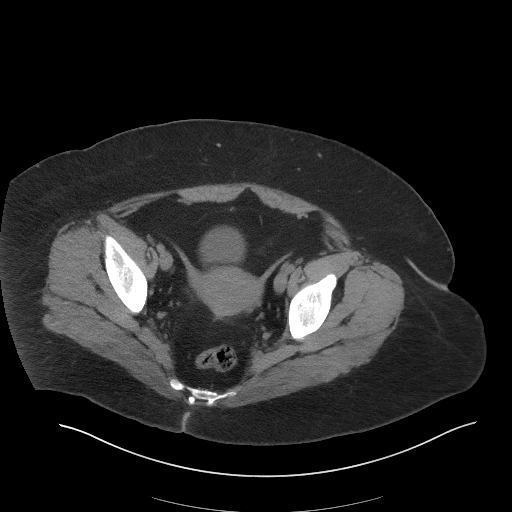
[im 33/98  soft-tissue]
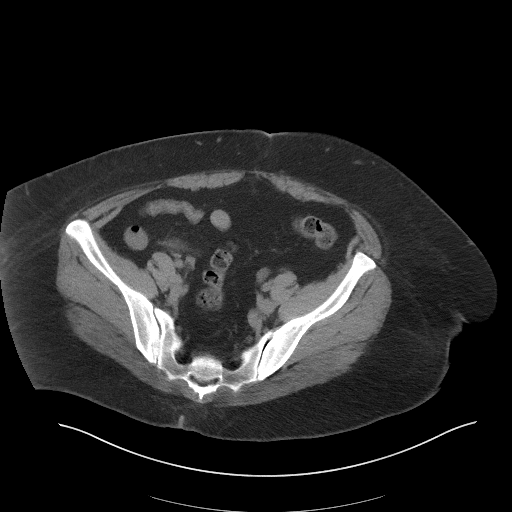
[im 41/98  soft-tissue]
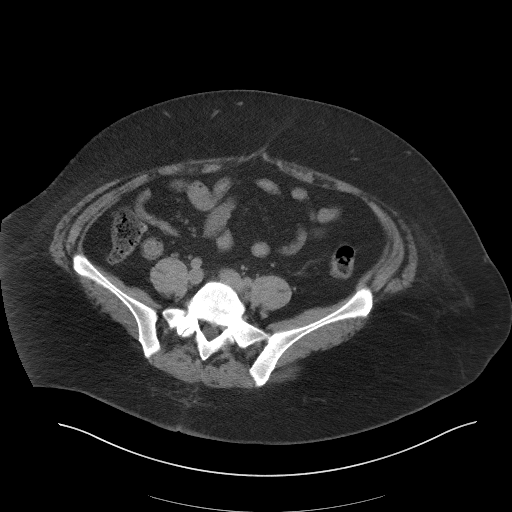
[im 45/98  soft-tissue]
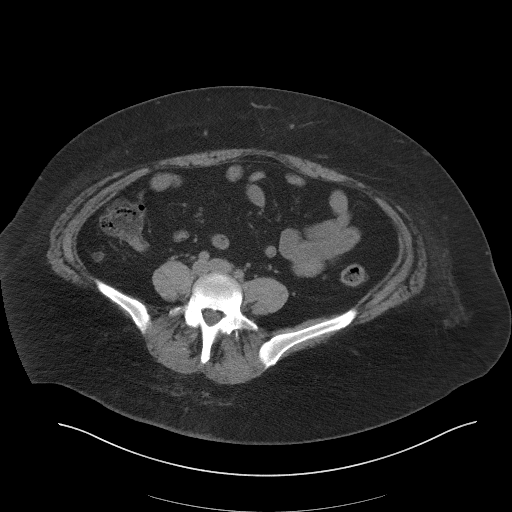
[im 53/98  soft-tissue]
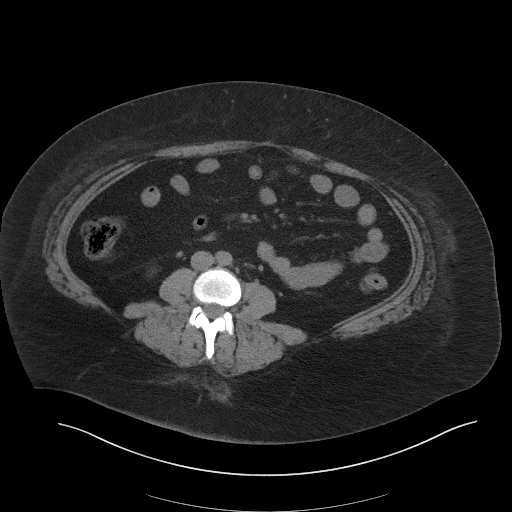
[im 57/98  soft-tissue]
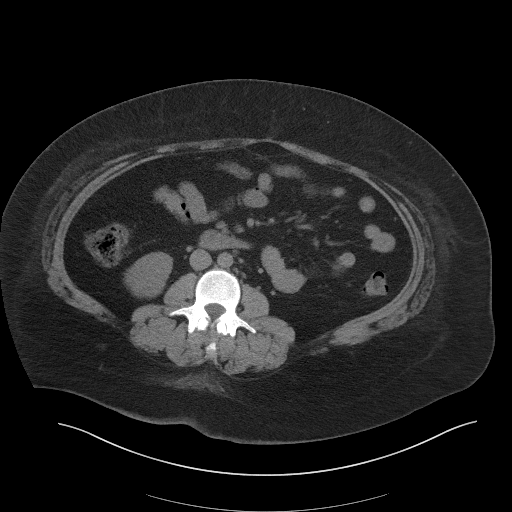
[im 57/98  bone]
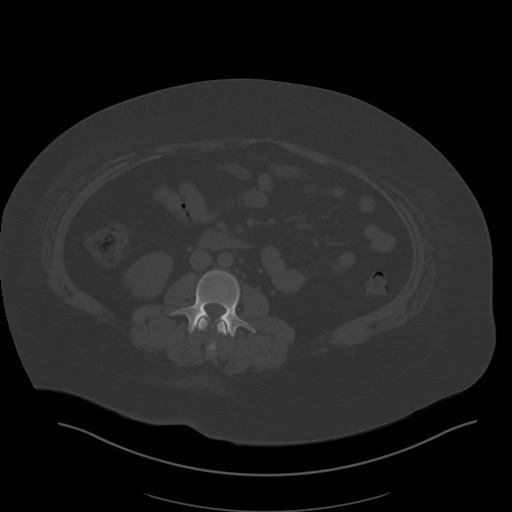
[im 65/98  soft-tissue]
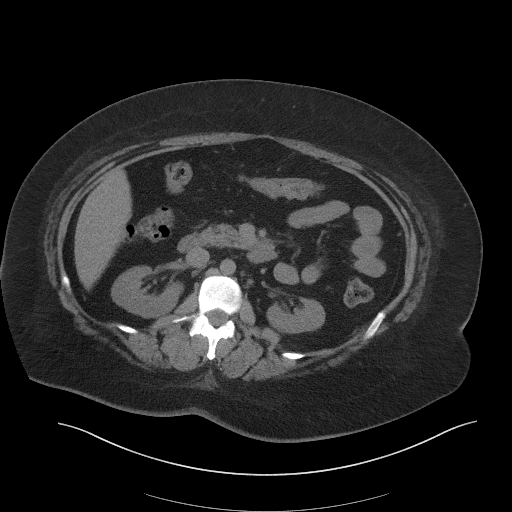
[im 73/98  soft-tissue]
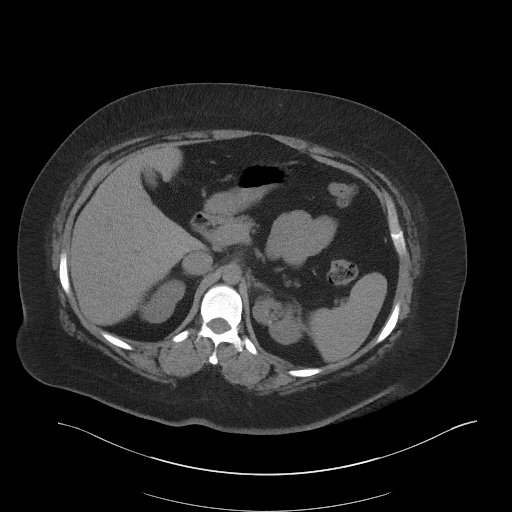
[im 77/98  soft-tissue]
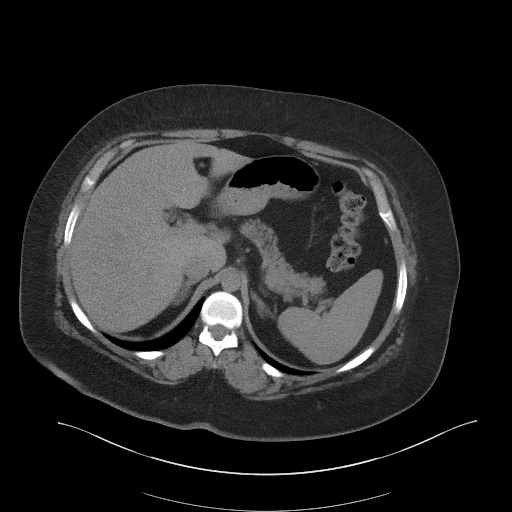
[im 85/98  soft-tissue]
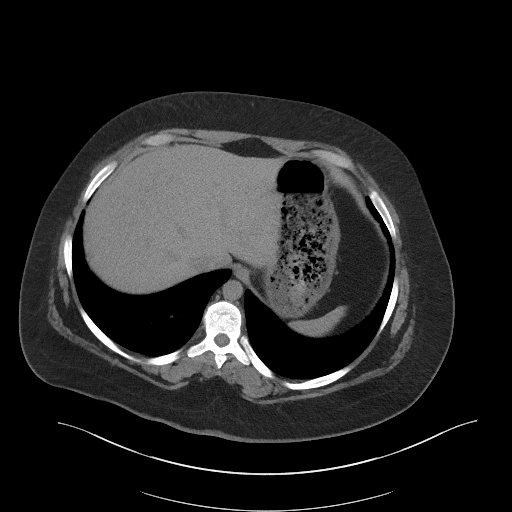
[im 93/98  soft-tissue]
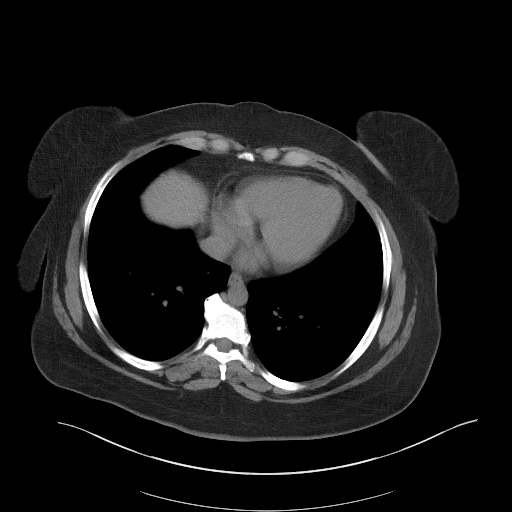

[Series 5: coronal · coronal · 0.90mm/px · 3 of 165 slices shown]
[im 55/165  soft-tissue]
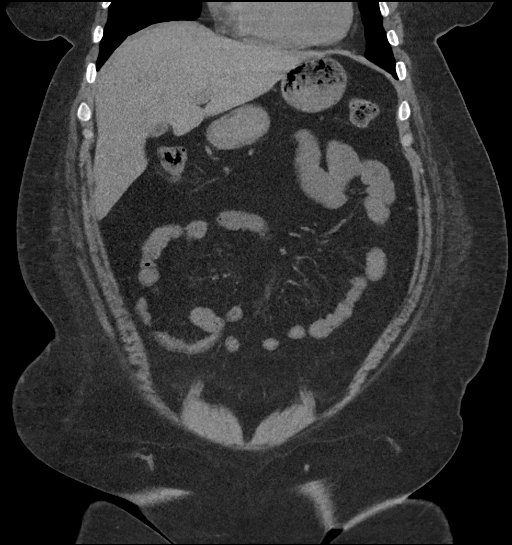
[im 73/165  soft-tissue]
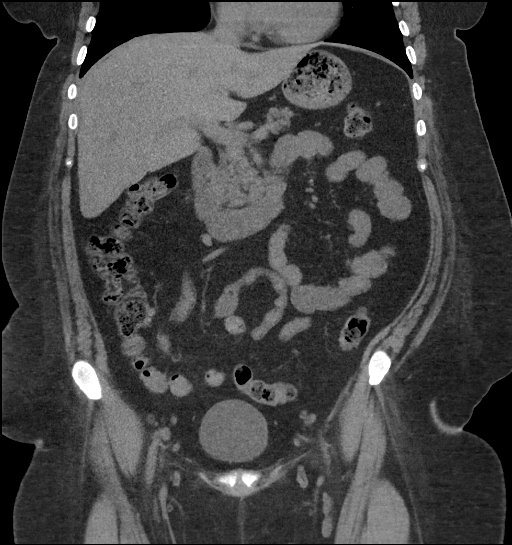
[im 92/165  soft-tissue]
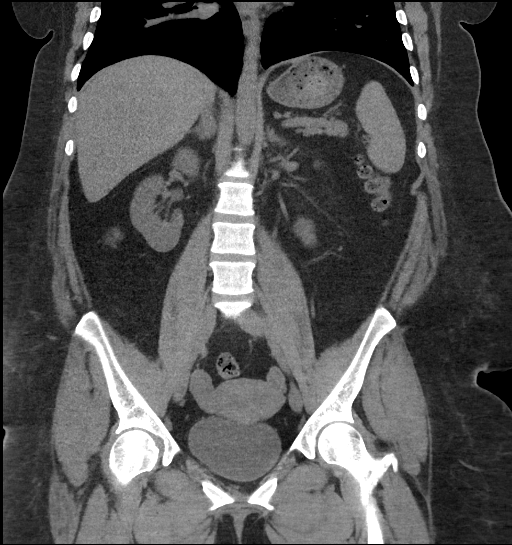

[17 of 46 positions shown; findings below may reference images not displayed]

FINDINGS: Lower chest: No acute abnormality.

Hepatobiliary: No focal liver abnormality is seen. No gallstones,
gallbladder wall thickening, or biliary dilatation.

Pancreas: Unremarkable. No pancreatic ductal dilatation or
surrounding inflammatory changes.

Spleen: Normal in size without focal abnormality.

Adrenals/Urinary Tract: The adrenal glands are normal. There is a 5
mm stone in the lower pole left kidney on coronal image 98. A
punctate stone is seen in the upper pole on image 106. No
hydronephrosis or perinephric stranding. No suspicious masses.
Regions of cortical thinning in the left kidney are consistent with
previous infections or infarcts. No acute perinephric stranding. The
ureters and bladder are normal.

Stomach/Bowel: Stomach is within normal limits. Appendix appears
normal. No evidence of bowel wall thickening, distention, or
inflammatory changes.

Vascular/Lymphatic: No significant vascular findings are present. No
enlarged abdominal or pelvic lymph nodes.

Reproductive: Uterus and bilateral adnexa are unremarkable.

Other: No abdominal wall hernia or abnormality. No abdominopelvic
ascites.

Musculoskeletal: No acute or significant osseous findings.
IMPRESSION: 1. Nonobstructive stones in the left kidney. No ureteral stones. No
bladder abnormalities identified.
2. No other acute abnormalities.

## 2018-05-21 ENCOUNTER — Encounter: Payer: Self-pay | Admitting: Emergency Medicine

## 2018-05-21 ENCOUNTER — Emergency Department
Admission: EM | Admit: 2018-05-21 | Discharge: 2018-05-21 | Disposition: A | Discharge status: Home / Self Care | Payer: Medicare HMO | Attending: Emergency Medicine | Admitting: Emergency Medicine

## 2018-05-21 DIAGNOSIS — R519 Headache, unspecified: Secondary | ICD-10-CM

## 2018-05-21 DIAGNOSIS — R51 Headache: Secondary | ICD-10-CM | POA: Insufficient documentation

## 2018-05-21 DIAGNOSIS — R109 Unspecified abdominal pain: Secondary | ICD-10-CM | POA: Diagnosis not present

## 2018-05-21 DIAGNOSIS — R112 Nausea with vomiting, unspecified: Secondary | ICD-10-CM | POA: Insufficient documentation

## 2018-05-21 DIAGNOSIS — Z79899 Other long term (current) drug therapy: Secondary | ICD-10-CM | POA: Diagnosis not present

## 2018-05-21 DIAGNOSIS — Z87442 Personal history of urinary calculi: Secondary | ICD-10-CM | POA: Diagnosis not present

## 2018-05-21 LAB — CBC
HCT: 39.2 % (ref 35.0–47.0)
HEMOGLOBIN: 14 g/dL (ref 12.0–16.0)
MCH: 30.3 pg (ref 26.0–34.0)
MCHC: 35.6 g/dL (ref 32.0–36.0)
MCV: 85 fL (ref 80.0–100.0)
Platelets: 338 10*3/uL (ref 150–440)
RBC: 4.61 MIL/uL (ref 3.80–5.20)
RDW: 13.9 % (ref 11.5–14.5)
WBC: 11.8 10*3/uL — ABNORMAL HIGH (ref 3.6–11.0)

## 2018-05-21 LAB — COMPREHENSIVE METABOLIC PANEL
ALK PHOS: 63 U/L (ref 38–126)
ALT: 17 U/L (ref 14–54)
AST: 24 U/L (ref 15–41)
Albumin: 4 g/dL (ref 3.5–5.0)
Anion gap: 9 (ref 5–15)
BILIRUBIN TOTAL: 0.4 mg/dL (ref 0.3–1.2)
BUN: 13 mg/dL (ref 6–20)
CO2: 26 mmol/L (ref 22–32)
Calcium: 9.1 mg/dL (ref 8.9–10.3)
Chloride: 104 mmol/L (ref 101–111)
Creatinine, Ser: 0.83 mg/dL (ref 0.44–1.00)
Glucose, Bld: 149 mg/dL — ABNORMAL HIGH (ref 65–99)
Potassium: 3.5 mmol/L (ref 3.5–5.1)
Sodium: 139 mmol/L (ref 135–145)
TOTAL PROTEIN: 7 g/dL (ref 6.5–8.1)

## 2018-05-21 LAB — URINALYSIS, ROUTINE W REFLEX MICROSCOPIC
Bacteria, UA: NONE SEEN
Bilirubin Urine: NEGATIVE
GLUCOSE, UA: NEGATIVE mg/dL
KETONES UR: NEGATIVE mg/dL
Leukocytes, UA: NEGATIVE
NITRITE: NEGATIVE
PH: 7 (ref 5.0–8.0)
PROTEIN: NEGATIVE mg/dL
Specific Gravity, Urine: 1.014 (ref 1.005–1.030)

## 2018-05-21 LAB — POCT PREGNANCY, URINE: Preg Test, Ur: NEGATIVE

## 2018-05-21 MED ORDER — HYDROCODONE-ACETAMINOPHEN 5-325 MG PO TABS: 1.0000 | ORAL_TABLET | Freq: Four times a day (QID) | ORAL | 0 refills | Status: DC | PRN

## 2018-05-21 MED ORDER — ONDANSETRON 4 MG PO TBDP
4.0000 mg | ORAL_TABLET | Freq: Once | ORAL | Status: AC
Start: 2018-05-21 — End: 2018-05-21
  Administered 2018-05-21: 4 mg via ORAL
  Filled 2018-05-21: qty 1

## 2018-05-21 MED ORDER — IBUPROFEN 800 MG PO TABS: 800.0000 mg | ORAL_TABLET | Freq: Three times a day (TID) | ORAL | 0 refills | Status: DC | PRN

## 2018-05-21 MED ORDER — ONDANSETRON 4 MG PO TBDP: 4.0000 mg | ORAL_TABLET | Freq: Three times a day (TID) | ORAL | 0 refills | Status: DC | PRN

## 2018-05-21 MED ORDER — KETOROLAC TROMETHAMINE 30 MG/ML IJ SOLN
60.0000 mg | Freq: Once | INTRAMUSCULAR | Status: AC
  Administered 2018-05-21: 60 mg via INTRAMUSCULAR
  Filled 2018-05-21: qty 2

## 2018-05-21 MED ORDER — HYDROCODONE-ACETAMINOPHEN 5-325 MG PO TABS
1.0000 | ORAL_TABLET | Freq: Once | ORAL | Status: AC
  Administered 2018-05-21: 1 via ORAL
  Filled 2018-05-21: qty 1

## 2018-05-21 NOTE — Discharge Instructions (Addendum)
1.  You may take pain medicines as needed (Motrin/Norco #15). °2.  Return to the ER for worsening symptoms, persistent vomiting, difficulty breathing or other concerns. °

## 2018-05-21 NOTE — ED Notes (Signed)

## 2018-05-21 NOTE — ED Provider Notes (Signed)
[facility] Emergency Department Provider Note   ____________________________________________   First MD Initiated Contact with Patient 05/21/18 [phone]     (approximate)  I have reviewed the triage vital signs and the nursing notes.   HISTORY  Chief Complaint Back Pain and Headache    HPI Brittney Sanders is a 32 y.o. female who presents to the ED from home with a chief complaint of headache and left flank pain.  Patient had a mild, gradual onset headache which began about 20 minutes prior to arrival.  She presents because she took ibuprofen but vomited once.  Subsequently her left leg began to hurt and she feels like she has either UTI or kidney stone.  Has a history of occasional headaches relieved by ibuprofen.  Denies recent fever, chills, vision changes, neck pain, chest pain, shortness of breath, abdominal pain, dysuria, hematuria or diarrhea.  Denies recent travel or trauma.  Denies use of anticoagulants.   Past Medical History:  Diagnosis Date  . Depression   . Eczema   . Hearing deficit   . Heel spur   . Kidney stone   . Left nephrolithiasis   . Nocturia   . Obesity   . Overweight   . Plantar fasciitis   . Seasonal allergies   . UTI (lower urinary tract infection)     Patient Active Problem List   Diagnosis Date Noted  . Auditory disturbance 12/30/2015  . Adiposity 12/30/2015  . Renal stone 12/17/2015  . Pyelonephritis 12/17/2015  . 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 Calculus of kidney 08/16/2014  . Allergic rhinitis 04/03/2014  . Stress fracture of calcaneus 03/14/2014  . Calcaneal spur 02/23/2014  . Plantar fasciitis 02/23/2014    Past Surgical History:  Procedure Laterality Date  . TUBAL LIGATION    . TYMPANOSTOMY TUBE PLACEMENT Right     Prior to Admission medications   Medication Sig Start Date End Date Taking? Authorizing Provider  Acetaminophen 80 MG/0.8ML oral soluiton acetaminophen 80 mg    [provider]    azelastine (OPTIVAR) 0.05 % ophthalmic solution  10/16/15   [provider]  betamethasone dipropionate (DIPROLENE) 0.05 % ointment betamethasone dipropionate 0.05 % topical ointment    [provider]  cetirizine (ZYRTEC) 10 MG tablet Take 10 mg by mouth daily.    [provider]  dicyclomine (BENTYL) 20 MG tablet Take 1 tablet (20 mg total) by mouth 3 (three) times daily as needed (abdominal pain). 12/22/17   Phineas SemenGoodman, Graydon, MD  ibuprofen (ADVIL,MOTRIN) 800 MG tablet Take 1 tablet (800 mg total) by mouth every 8 (eight) hours as needed for moderate pain. 02/04/17   Irean HongSung, Story Conti J, MD  Loratadine 10 MG CAPS  12/01/15   [provider]  mometasone (ELOCON) 0.1 % cream mometasone 0.1 % topical cream    [provider]  NALTREXONE HCL PO Take by mouth.    [provider]  nystatin (MYCOSTATIN/NYSTOP) powder  05/16/16   [provider]  ondansetron (ZOFRAN) 4 MG tablet Take 1 tablet (4 mg total) by mouth every 8 (eight) hours as needed for nausea or vomiting. 12/22/17   Phineas SemenGoodman, Graydon, MD  oxyCODONE-acetaminophen (ROXICET) 5-325 MG tablet Take 1 tablet by mouth every 4 (four) hours as needed for severe pain. 02/04/17   Irean HongSung, Netty Sullivant J, MD  PARoxetine (PAXIL) 40 MG tablet Take 1 tablet by mouth daily. 10/06/15   [provider]  phentermine (ADIPEX-P) 37.5 MG tablet Take 1 tablet by mouth daily.  10/21/15   [provider]  topiramate (TOPAMAX SPRINKLE) 25 MG capsule Take 25 mg by mouth daily.    [provider]  triamcinolone lotion (KENALOG) 0.1 %  05/18/16   [provider]  VENTOLIN HFA 108 (90 Base) MCG/ACT inhaler  11/26/15   [provider]  Zinc Oxide 10 % OINT diaper rash ointment - 60 gm    [provider]    Allergies Sulfa antibiotics  Family History  Problem Relation Age of Onset  . Urolithiasis Maternal Grandmother   . Kidney disease Neg Hx   . Bladder Cancer Neg Hx   .  Kidney cancer Neg Hx     Social History Social History   Tobacco Use  . Smoking status: Never Smoker  . Smokeless tobacco: Never Used  Substance Use Topics  . Alcohol use: No    Alcohol/week: 0.0 oz  . Drug use: No    Review of Systems  Constitutional: No fever/chills Eyes: No visual changes. ENT: No sore throat. Cardiovascular: Denies chest pain. Respiratory: Denies shortness of breath. Gastrointestinal: Positive for left flank pain.  No abdominal pain.  Positive for one episode of vomiting.  No diarrhea.  No constipation. Genitourinary: Negative for dysuria. Musculoskeletal: Negative for back pain. Skin: Negative for rash. Neurological: Positive for headache.  Negative for focal weakness or numbness.   ____________________________________________   PHYSICAL EXAM:  VITAL SIGNS: ED Triage Vitals [05/21/18 0308]  Enc Vitals Group     BP 119/78     Pulse Rate 83     Resp 18     Temp 98 F (36.7 C)     Temp Source Oral     SpO2 99 %     Weight 260 lb (117.9 kg)     Height 5\' 3"  (1.6 m)     Head Circumference      Peak Flow      Pain Score 6     Pain Loc      Pain Edu?      Excl. in GC?     Constitutional: Asleep, awakened for exam.  Alert and oriented. Well appearing and in no acute distress. Eyes: Conjunctivae are normal. PERRL. EOMI. Head: Atraumatic. Nose: No congestion/rhinnorhea. Mouth/Throat: Mucous membranes are moist.  Oropharynx non-erythematous. Neck: No stridor.   Cardiovascular: Normal rate, regular rhythm. Grossly normal heart sounds.  Good peripheral circulation. Respiratory: Normal respiratory effort.  No retractions. Lungs CTAB. Gastrointestinal: Obese.  Soft and nontender to light or deep palpation. No distention. No abdominal bruits.  Slight left CVA tenderness. Musculoskeletal: No lower extremity tenderness nor edema.  No joint effusions. Neurologic:  Normal speech and language. No gross focal neurologic deficits are appreciated. No gait  instability. Skin:  Skin is warm, dry and intact. No rash noted. Psychiatric: Mood and affect are normal. Speech and behavior are normal.  ____________________________________________   LABS (all labs ordered are listed, but only abnormal results are displayed)  Labs Reviewed  COMPREHENSIVE METABOLIC PANEL - Abnormal; Notable for the following components:      Result Value   Glucose, Bld 149 (*)    All other components within normal limits  CBC - Abnormal; Notable for the following components:   WBC 11.8 (*)    All other components within normal limits  URINALYSIS, ROUTINE W REFLEX MICROSCOPIC - Abnormal; Notable for the following components:   Color, Urine YELLOW (*)    APPearance CLEAR (*)    Hgb urine dipstick MODERATE (*)  All other components within normal limits  POCT PREGNANCY, URINE   ____________________________________________  EKG  None ____________________________________________  RADIOLOGY  ED MD interpretation: None  Official radiology report(s): No results found.  ____________________________________________   PROCEDURES  Procedure(s) performed: None  Procedures  Critical Care performed: No  ____________________________________________   INITIAL IMPRESSION / ASSESSMENT AND PLAN / ED COURSE  As part of my medical decision making, I reviewed the following data within the electronic MEDICAL RECORD NUMBER Nursing notes reviewed and incorporated, Labs reviewed, Old chart reviewed and Notes from prior ED visits   32 year old female who presents with mild headache, left flank pain and one episode of vomiting.  Differential diagnosis includes but is not limited to headache, ICH, meningitis, musculoskeletal, ureteral colic, pyelonephritis, etc.  Patient is well appearing; neck is supple, she is without focal neurological deficits with largely negative work-up which includes laboratory results and urinalysis.  I personally reviewed patient's old chart and  see that she had a CT renal colic study done approximately 1 year ago.  She demonstrates no signs of obstructive ureteral stone; therefore I do not think imaging is warranted.  Will administer IM Toradol with Norco and reassess.  Clinical Course as of May 21 628  Sat May 21, 2018  0627 She is feeling significantly better.  Will discharge home with prescriptions for Motrin, Norco and Zofran.  She will follow-up closely with her PCP.  Strict return precautions given.  Patient verbalizes understanding and agrees with plan of care.   [JS]    Clinical Course User Index [JS] Irean Hong, MD     ____________________________________________   FINAL CLINICAL IMPRESSION(S) / ED DIAGNOSES  Final diagnoses:  Left flank pain  Acute nonintractable headache, unspecified headache type  Non-intractable vomiting with nausea, unspecified vomiting type     ED Discharge Orders    None       Note:  This document was prepared using Dragon voice recognition software and may include unintentional dictation errors.    Irean Hong, MD 05/21/18 [phone]

## 2018-05-21 NOTE — ED Triage Notes (Addendum)
Patient with complaint headache that started about 25 minutes ago. Patient reports vomiting times one. Patient with complaint of left lower back pain that started after vomiting. Patient states that she has a history of frequent UTIs and a history of kidney stones.

## 2018-05-22 LAB — URINE CULTURE: CULTURE: NO GROWTH

## 2018-05-23 ENCOUNTER — Ambulatory Visit: Payer: Medicare HMO | Admitting: Urology

## 2018-06-09 ENCOUNTER — Other Ambulatory Visit: Payer: Self-pay

## 2018-06-29 ENCOUNTER — Other Ambulatory Visit: Payer: Self-pay

## 2018-06-29 ENCOUNTER — Emergency Department
Admission: EM | Admit: 2018-06-29 | Discharge: 2018-06-29 | Disposition: A | Discharge status: Home / Self Care | Payer: Medicare HMO | Attending: Emergency Medicine | Admitting: Emergency Medicine

## 2018-06-29 ENCOUNTER — Encounter: Payer: Self-pay | Admitting: *Deleted

## 2018-06-29 DIAGNOSIS — R030 Elevated blood-pressure reading, without diagnosis of hypertension: Secondary | ICD-10-CM | POA: Insufficient documentation

## 2018-06-29 DIAGNOSIS — Z79899 Other long term (current) drug therapy: Secondary | ICD-10-CM | POA: Diagnosis not present

## 2018-06-29 LAB — URINALYSIS, COMPLETE (UACMP) WITH MICROSCOPIC
BILIRUBIN URINE: NEGATIVE
Bacteria, UA: NONE SEEN
GLUCOSE, UA: NEGATIVE mg/dL
HGB URINE DIPSTICK: NEGATIVE
KETONES UR: NEGATIVE mg/dL
LEUKOCYTES UA: NEGATIVE
NITRITE: NEGATIVE
PH: 6 (ref 5.0–8.0)
PROTEIN: NEGATIVE mg/dL
Specific Gravity, Urine: 1.016 (ref 1.005–1.030)

## 2018-06-29 LAB — POC URINE PREG, ED: Preg Test, Ur: NEGATIVE

## 2018-06-29 NOTE — ED Triage Notes (Signed)
PT to ED reporting a foul smell to her urine for the past couple days. Urine is concentrated after urine sample is provided. No fevers and no other symptoms reported but pt reports a hx of UTI and kidney stones and reports, "I feel fine but you never know"   Pt also was seen at dentist today and had an elevated BP. No hx of the same but pt called PCP and they were unable to see her but told her to come to the ED. No neuro symptoms but intermittent headaches.

## 2018-06-29 NOTE — ED Provider Notes (Signed)
[facility] Emergency Department Provider Note  ____________________________________________  Time seen: Approximately 1:46 PM  I have reviewed the triage vital signs and the nursing notes.   HISTORY  Chief Complaint Hypertension and Urinary Tract Infection    HPI Brittney Sanders is a 32 y.o. female with a history of kidney stones and obesity who comes to the ED today for a blood pressure check.  She went to her dentist today to have a tooth filled, and when they checked her blood pressure was about 150/100.  They completed the procedure and told her to follow-up with her doctor on her blood pressure.  She called her doctor's office who was unable to see her today so she came to the ED instead to have her blood pressure checked.  No acute symptoms.  No chest pain shortness of breath swelling or exertional complaints.  No history of hypertension.  No hot or cold intolerance.        Past Medical History:  Diagnosis Date  . Depression   . Eczema   . Hearing deficit   . Heel spur   . Kidney stone   . Left nephrolithiasis   . Nocturia   . Obesity   . Overweight   . Plantar fasciitis   . Seasonal allergies   . UTI (lower urinary tract infection)      Patient Active Problem List   Diagnosis Date Noted  . Auditory disturbance 12/30/2015  . Adiposity 12/30/2015  . Renal stone 12/17/2015  . Pyelonephritis 12/17/2015  . 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 Calculus of kidney 08/16/2014  . Allergic rhinitis 04/03/2014  . Stress fracture of calcaneus 03/14/2014  . Calcaneal spur 02/23/2014  . Plantar fasciitis 02/23/2014     Past Surgical History:  Procedure Laterality Date  . TUBAL LIGATION    . TYMPANOSTOMY TUBE PLACEMENT Right      Prior to Admission medications   Medication Sig Start Date End Date Taking? Authorizing Provider  Acetaminophen 80 MG/0.8ML oral soluiton acetaminophen 80 mg    [provider]  azelastine (OPTIVAR) 0.05 %  ophthalmic solution  10/16/15   [provider]  betamethasone dipropionate (DIPROLENE) 0.05 % ointment betamethasone dipropionate 0.05 % topical ointment    [provider]  cetirizine (ZYRTEC) 10 MG tablet Take 10 mg by mouth daily.    [provider]  dicyclomine (BENTYL) 20 MG tablet Take 1 tablet (20 mg total) by mouth 3 (three) times daily as needed (abdominal pain). 12/22/17   Phineas SemenGoodman, Graydon, MD  HYDROcodone-acetaminophen (NORCO) 5-325 MG tablet Take 1 tablet by mouth every 6 (six) hours as needed for moderate pain. 05/21/18   Irean HongSung, Jade J, MD  ibuprofen (ADVIL,MOTRIN) 800 MG tablet Take 1 tablet (800 mg total) by mouth every 8 (eight) hours as needed for moderate pain. 05/21/18   Irean HongSung, Jade J, MD  Loratadine 10 MG CAPS  12/01/15   [provider]  mometasone (ELOCON) 0.1 % cream mometasone 0.1 % topical cream    [provider]  NALTREXONE HCL PO Take by mouth.    [provider]  nystatin (MYCOSTATIN/NYSTOP) powder  05/16/16   [provider]  ondansetron (ZOFRAN ODT) 4 MG disintegrating tablet Take 1 tablet (4 mg total) by mouth every 8 (eight) hours as needed for nausea or vomiting. 05/21/18   Irean HongSung, Jade J, MD  ondansetron (ZOFRAN) 4 MG tablet Take 1 tablet (4 mg total) by mouth every 8 (eight) hours as needed for nausea or  vomiting. 12/22/17   Phineas Semen, MD  oxyCODONE-acetaminophen (ROXICET) 5-325 MG tablet Take 1 tablet by mouth every 4 (four) hours as needed for severe pain. 02/04/17   Irean Hong, MD  PARoxetine (PAXIL) 40 MG tablet Take 1 tablet by mouth daily. 10/06/15   [provider]  phentermine (ADIPEX-P) 37.5 MG tablet Take 1 tablet by mouth daily. 10/21/15   [provider]  topiramate (TOPAMAX SPRINKLE) 25 MG capsule Take 25 mg by mouth daily.    [provider]  triamcinolone lotion (KENALOG) 0.1 %  05/18/16   [provider]  VENTOLIN HFA 108 (90 Base) MCG/ACT inhaler  11/26/15    [provider]  Zinc Oxide 10 % OINT diaper rash ointment - 60 gm    [provider]     Allergies Sulfa antibiotics   Family History  Problem Relation Age of Onset  . Urolithiasis Maternal Grandmother   . Kidney disease Neg Hx   . Bladder Cancer Neg Hx   . Kidney cancer Neg Hx     Social History Social History   Tobacco Use  . Smoking status: Never Smoker  . Smokeless tobacco: Never Used  Substance Use Topics  . Alcohol use: No    Alcohol/week: 0.0 oz  . Drug use: No    Review of Systems  Constitutional:   No fever or chills.  ENT:   No sore throat. No rhinorrhea. Cardiovascular:   Occasional chest tightness lasting a few seconds without aggravating alleviating factors or associated symptoms over the past several months syncope. Respiratory:   No dyspnea or cough. Gastrointestinal:   Negative for abdominal pain, vomiting and diarrhea.  Musculoskeletal: Positive for left side pain over the past several months, intermittent, sharp, mild, no aggravating or alleviating factors.  No recent trauma.  Negative for extremity swelling All other systems reviewed and are negative except as documented above in ROS and HPI.  ____________________________________________   PHYSICAL EXAM:  VITAL SIGNS: ED Triage Vitals  Enc Vitals Group     BP 06/29/18 1101 (!) 153/85     Pulse Rate 06/29/18 1101 81     Resp 06/29/18 1101 20     Temp 06/29/18 1101 98.6 F (37 C)     Temp Source 06/29/18 1101 Oral     SpO2 06/29/18 1101 96 %     Weight 06/29/18 1102 260 lb (117.9 kg)     Height 06/29/18 1102 5\' 3"  (1.6 m)     Head Circumference --      Peak Flow --      Pain Score 06/29/18 1111 0     Pain Loc --      Pain Edu? --      Excl. in GC? --     Vital signs reviewed, nursing assessments reviewed.   Constitutional:   Alert and oriented. Non-toxic appearance. Eyes:   Conjunctivae are normal. EOMI. PERRL. ENT      Head:   Normocephalic and atraumatic.       Nose:   No congestion/rhinnorhea.       Mouth/Throat:   MMM, no pharyngeal erythema. No peritonsillar mass.       Neck:   No meningismus. Full ROM.  Thyroid nonpalpable Hematological/Lymphatic/Immunilogical:   No cervical lymphadenopathy. Cardiovascular:   RRR. Symmetric bilateral radial and DP pulses.  No murmurs. Cap refill less than 2 seconds. Respiratory:   Normal respiratory effort without tachypnea/retractions. Breath sounds are clear and equal bilaterally. No wheezes/rales/rhonchi. Gastrointestinal:  Soft and nontender. Non distended. There is no CVA tenderness.  No rebound, rigidity, or guarding. Musculoskeletal:   Normal range of motion in all extremities. No joint effusions.  No lower extremity tenderness.  No edema. Neurologic:   Normal speech and language.  Motor grossly intact. No acute focal neurologic deficits are appreciated.  Skin:    Skin is warm, dry and intact. No rash noted.  No petechiae, purpura, or bullae.  ____________________________________________    LABS (pertinent positives/negatives) (all labs ordered are listed, but only abnormal results are displayed) Labs Reviewed  URINALYSIS, COMPLETE (UACMP) WITH MICROSCOPIC - Abnormal; Notable for the following components:      Result Value   Color, Urine YELLOW (*)    APPearance CLEAR (*)    All other components within normal limits  POC URINE PREG, ED   ____________________________________________   EKG    ____________________________________________    RADIOLOGY  No results found.  ____________________________________________   PROCEDURES Procedures  ____________________________________________    CLINICAL IMPRESSION / ASSESSMENT AND PLAN / ED COURSE  Pertinent labs & imaging results that were available during my care of the patient were reviewed by me and considered in my medical decision making (see chart for details).    Patient presents for evaluation of an elevated blood pressure  reading.  Here in the ED her blood pressure is also about 150/85.  This is in the setting of a relatively stressful day where she was going to have a tooth filling at the dentist and then coming to the emergency department.  Advised her to follow-up with her PCP a week from now for recheck of her blood pressure, no blood pressure management at this time due to the risk of causing hypotension.  No other worrisome symptoms.  No evidence of ACS PE dissection AAA sepsis or other acute illness.  No worrisome pain syndromes.      ____________________________________________   FINAL CLINICAL IMPRESSION(S) / ED DIAGNOSES    Final diagnoses:  Elevated blood pressure reading     ED Discharge Orders    None      Portions of this note were generated with dragon dictation software. Dictation errors may occur despite best attempts at proofreading.    Sharman Cheek, MD 06/29/18 1350

## 2018-07-02 ENCOUNTER — Other Ambulatory Visit: Payer: Self-pay

## 2018-07-02 DIAGNOSIS — L0291 Cutaneous abscess, unspecified: Secondary | ICD-10-CM | POA: Insufficient documentation

## 2018-07-02 DIAGNOSIS — Z79899 Other long term (current) drug therapy: Secondary | ICD-10-CM | POA: Insufficient documentation

## 2018-07-02 NOTE — ED Triage Notes (Signed)
Patient reports having "boil on my privates".  Reports noticed area yesterday.

## 2018-07-03 ENCOUNTER — Emergency Department
Admission: EM | Admit: 2018-07-03 | Discharge: 2018-07-03 | Disposition: A | Discharge status: Home / Self Care | Payer: Medicare HMO | Attending: Emergency Medicine | Admitting: Emergency Medicine

## 2018-07-03 DIAGNOSIS — L0291 Cutaneous abscess, unspecified: Secondary | ICD-10-CM

## 2018-07-03 MED ORDER — CLINDAMYCIN HCL 300 MG PO CAPS: 300.0000 mg | ORAL_CAPSULE | Freq: Three times a day (TID) | ORAL | 0 refills | Status: DC

## 2018-07-03 MED ORDER — HYDROCODONE-ACETAMINOPHEN 5-325 MG PO TABS: 1.0000 | ORAL_TABLET | Freq: Four times a day (QID) | ORAL | 0 refills | Status: DC | PRN

## 2018-07-03 MED ORDER — CLINDAMYCIN HCL 150 MG PO CAPS
300.0000 mg | ORAL_CAPSULE | Freq: Once | ORAL | Status: AC
  Administered 2018-07-03: 300 mg via ORAL
  Filled 2018-07-03: qty 2

## 2018-07-03 MED ORDER — IBUPROFEN 800 MG PO TABS: 800.0000 mg | ORAL_TABLET | Freq: Three times a day (TID) | ORAL | 0 refills | Status: DC | PRN

## 2018-07-03 MED ORDER — HYDROCODONE-ACETAMINOPHEN 5-325 MG PO TABS
1.0000 | ORAL_TABLET | Freq: Once | ORAL | Status: AC
  Administered 2018-07-03: 1 via ORAL
  Filled 2018-07-03: qty 1

## 2018-07-03 MED ORDER — IBUPROFEN 800 MG PO TABS
800.0000 mg | ORAL_TABLET | Freq: Once | ORAL | Status: AC
  Administered 2018-07-03: 800 mg via ORAL
  Filled 2018-07-03: qty 1
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
## 2018-07-03 NOTE — Discharge Instructions (Signed)
1.  Take antibiotic as prescribed (Clindamycin 300 mg 3 times daily x10 days). 2.  You may take pain medicines as needed (Motrin/Norco #15). 3.  Apply warm compresses several times daily. 4.  Return to the ER for worsening symptoms, persistent vomiting, fever or other concerns.

## 2018-07-27 ENCOUNTER — Encounter: Payer: Self-pay | Admitting: Urology

## 2018-07-27 ENCOUNTER — Ambulatory Visit
Admission: RE | Admit: 2018-07-27 | Discharge: 2018-07-27 | Disposition: A | Discharge status: Home / Self Care | Payer: Medicare HMO | Source: Ambulatory Visit | Attending: Urology | Admitting: Urology

## 2018-07-27 ENCOUNTER — Ambulatory Visit (INDEPENDENT_AMBULATORY_CARE_PROVIDER_SITE_OTHER): Payer: Medicare HMO | Admitting: Urology

## 2018-07-27 VITALS — BP 122/80 | HR 87 | Ht 63.0 in | Wt 283.3 lb

## 2018-07-27 DIAGNOSIS — Z87440 Personal history of urinary (tract) infections: Secondary | ICD-10-CM

## 2018-07-27 DIAGNOSIS — N200 Calculus of kidney: Secondary | ICD-10-CM | POA: Insufficient documentation

## 2018-07-27 LAB — URINALYSIS, COMPLETE
BILIRUBIN UA: NEGATIVE
Glucose, UA: NEGATIVE
KETONES UA: NEGATIVE
Nitrite, UA: NEGATIVE
Specific Gravity, UA: >1.03 — ABNORMAL HIGH (ref 1.005–1.030)
UUROB: 0.2 mg/dL (ref 0.2–1.0)
pH, UA: 5.5 (ref 5.0–7.5)

## 2018-07-27 LAB — MICROSCOPIC EXAMINATION

## 2018-07-27 NOTE — Progress Notes (Signed)
07/27/2018 11:08 AM   Brittney Sanders 03-11-1986 [id number]  Referring provider: Geryl RankinsBurns, Marianthe M, MD [address] [place], [place] [id number]  Chief Complaint  Patient presents with  . Advice Only    HPI: 32 year old female presented today for a second opinion regarding a previous diagnosis of left nephrolithiasis and vesicoureteral reflux grade 1.  She saw Dr. Marlou PorchHerrick in 2017 and no treatment was recommended.  She also has a nonobstructing 6 mm left renal calculus.  She saw Carollee HerterShannon in January 2019.  She has no complaints today.   PMH: Past Medical History:  Diagnosis Date  . Depression   . Eczema   . Hearing deficit   . Heel spur   . Kidney stone   . Left nephrolithiasis   . Nocturia   . Obesity   . Overweight   . Plantar fasciitis   . Seasonal allergies   . UTI (lower urinary tract infection)     Surgical History: Past Surgical History:  Procedure Laterality Date  . TUBAL LIGATION    . TYMPANOSTOMY TUBE PLACEMENT Right     Home Medications:  Allergies as of 07/27/2018      Reactions   Sulfa Antibiotics Hives, Rash      Medication List        Accurate as of 07/27/18 11:08 AM. Always use your most recent med list.          Acetaminophen 80 MG/0.8ML oral soluiton acetaminophen 80 mg   amoxicillin 500 MG capsule Commonly known as:  AMOXIL   azelastine 0.05 % ophthalmic solution Commonly known as:  OPTIVAR   betamethasone dipropionate 0.05 % ointment Commonly known as:  DIPROLENE betamethasone dipropionate 0.05 % topical ointment   cephALEXin 500 MG capsule Commonly known as:  KEFLEX cephalexin 500 mg capsule   cetirizine 10 MG tablet Commonly known as:  ZYRTEC Take 10 mg by mouth daily.   clindamycin 300 MG capsule Commonly known as:  CLEOCIN Take 1 capsule (300 mg total) by mouth 3 (three) times daily.   dicyclomine 20 MG tablet Commonly known as:  BENTYL Take 1 tablet (20 mg total) by mouth 3 (three) times daily as  needed (abdominal pain).   fexofenadine 180 MG tablet Commonly known as:  ALLEGRA Take 180 mg by mouth daily.   HYDROcodone-acetaminophen 5-325 MG tablet Commonly known as:  NORCO/VICODIN Take 1 tablet by mouth every 6 (six) hours as needed for moderate pain.   ibuprofen 800 MG tablet Commonly known as:  ADVIL,MOTRIN Take 1 tablet (800 mg total) by mouth every 8 (eight) hours as needed for moderate pain.   Loratadine 10 MG Caps   mometasone 0.1 % cream Commonly known as:  ELOCON mometasone 0.1 % topical cream   NALTREXONE HCL PO Take by mouth.   nystatin powder Commonly known as:  MYCOSTATIN/NYSTOP   ondansetron 4 MG disintegrating tablet Commonly known as:  ZOFRAN-ODT Take 1 tablet (4 mg total) by mouth every 8 (eight) hours as needed for nausea or vomiting.   ondansetron 4 MG tablet Commonly known as:  ZOFRAN Take 1 tablet (4 mg total) by mouth every 8 (eight) hours as needed for nausea or vomiting.   oxyCODONE-acetaminophen 5-325 MG tablet Commonly known as:  PERCOCET/ROXICET Take 1 tablet by mouth every 4 (four) hours as needed for severe pain.   PARoxetine 40 MG tablet Commonly known as:  PAXIL Take 1 tablet by mouth daily.   phentermine 37.5 MG tablet Commonly known as:  ADIPEX-P Take 1 tablet by mouth daily.   TOPAMAX SPRINKLE 25 MG capsule Generic drug:  topiramate Take 25 mg by mouth daily.   triamcinolone lotion 0.1 % Commonly known as:  KENALOG   VENTOLIN HFA 108 (90 Base) MCG/ACT inhaler Generic drug:  albuterol   Zinc Oxide 10 % Oint diaper rash ointment - 60 gm       Allergies:  Allergies  Allergen Reactions  . Sulfa Antibiotics Hives and Rash    Family History: Family History  Problem Relation Age of Onset  . Urolithiasis Maternal Grandmother   . Kidney disease Neg Hx   . Bladder Cancer Neg Hx   . Kidney cancer Neg Hx     Social History:  reports that she has never smoked. She has never used smokeless tobacco. She reports  that she does not drink alcohol or use drugs.  ROS: UROLOGY Frequent Urination?: No Hard to postpone urination?: No Burning/pain with urination?: No Get up at night to urinate?: No Leakage of urine?: No Urine stream starts and stops?: No Trouble starting stream?: No Do you have to strain to urinate?: No Blood in urine?: No Urinary tract infection?: No Sexually transmitted disease?: No Injury to kidneys or bladder?: No Painful intercourse?: No Weak stream?: No Currently pregnant?: No Vaginal bleeding?: No Last menstrual period?: Unknown  Gastrointestinal Nausea?: No Vomiting?: No Indigestion/heartburn?: No Diarrhea?: No Constipation?: No  Constitutional Fever: No Night sweats?: No Weight loss?: No Fatigue?: No  Skin Skin rash/lesions?: No Itching?: No  Eyes Blurred vision?: No Double vision?: No  Ears/Nose/Throat Sore throat?: Yes Sinus problems?: No  Hematologic/Lymphatic Swollen glands?: No Easy bruising?: No  Cardiovascular Leg swelling?: No Chest pain?: No  Respiratory Cough?: No Shortness of breath?: No  Endocrine Excessive thirst?: No  Musculoskeletal Back pain?: No Joint pain?: No  Neurological Headaches?: Yes Dizziness?: No  Psychologic Depression?: Yes Anxiety?: Yes  Physical Exam: BP 122/80 (BP Location: Left Arm, Patient Position: Sitting, Cuff Size: Large)   Pulse 87   Ht 5\' 3"  (1.6 m)   Wt 283 lb 4.8 oz (128.5 kg)   BMI 50.18 kg/m   Constitutional:  Alert and oriented, No acute distress.   Assessment & Plan:   Agree with previous provider recommendations on vesicle ureteral reflux and that no treatment would be recommended at this time.  Continue with periodic upper tract imaging.  Her left renal calculus is nonobstructing and has been stable and would not recommend treatment.  I did discuss ureteroscopic removal but if the stone is stable would not recommend.  She is due for a KUB in October and this will be obtained  today.  She has a follow-up appointment scheduled with Carollee Herter in October. Urinalysis today was clear.    Riki Altes, MD  [facility] [address] [place], [place] [address] [phone]

## 2018-07-28 ENCOUNTER — Telehealth: Payer: Self-pay

## 2018-07-28 NOTE — Telephone Encounter (Signed)
No treatment is recommended for her stone.  She has a follow-up appointment with Carollee HerterShannon in October which she can keep.

## 2018-07-28 NOTE — Telephone Encounter (Signed)
What would you like pt to do?

## 2018-07-28 NOTE — Telephone Encounter (Signed)
-----   Message from Riki AltesScott C Stoioff, MD sent at 07/28/2018  9:36 AM EDT ----- KUB reviewed.  Her left lower pole stone is difficult to visualize.  It may be overlying a rib.

## 2018-08-02 ENCOUNTER — Telehealth: Payer: Self-pay | Admitting: Urology

## 2018-08-02 NOTE — Telephone Encounter (Signed)
Pt called today asking for lab results.  She states she can see a bunch of numbers but doesn't know what they mean.  Please call pt.

## 2018-08-03 NOTE — Telephone Encounter (Signed)
The only labs she had was a urinalysis and there were no significant abnormalities

## 2018-08-05 NOTE — Telephone Encounter (Signed)
Patient notified

## 2018-08-15 DIAGNOSIS — M222X9 Patellofemoral disorders, unspecified knee: Secondary | ICD-10-CM | POA: Insufficient documentation

## 2018-08-21 ENCOUNTER — Emergency Department
Admission: EM | Admit: 2018-08-21 | Discharge: 2018-08-21 | Disposition: A | Discharge status: Home / Self Care | Payer: Medicare HMO | Attending: Emergency Medicine | Admitting: Emergency Medicine

## 2018-08-21 ENCOUNTER — Encounter: Payer: Self-pay | Admitting: Emergency Medicine

## 2018-08-21 ENCOUNTER — Other Ambulatory Visit: Payer: Self-pay

## 2018-08-21 ENCOUNTER — Emergency Department: Payer: Medicare HMO

## 2018-08-21 DIAGNOSIS — Z79899 Other long term (current) drug therapy: Secondary | ICD-10-CM | POA: Insufficient documentation

## 2018-08-21 DIAGNOSIS — M79651 Pain in right thigh: Secondary | ICD-10-CM | POA: Diagnosis present

## 2018-08-21 MED ORDER — CYCLOBENZAPRINE HCL 5 MG PO TABS: ORAL_TABLET | ORAL | 0 refills | Status: DC

## 2018-08-21 MED ORDER — MELOXICAM 15 MG PO TABS: 15.0000 mg | ORAL_TABLET | Freq: Every day | ORAL | 0 refills | Status: AC
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
## 2018-09-07 ENCOUNTER — Ambulatory Visit: Admit: 2018-09-07 | Discharge: 2018-09-08 | Payer: MEDICARE

## 2018-09-07 DIAGNOSIS — L309 Dermatitis, unspecified: Principal | ICD-10-CM

## 2018-09-07 MED ORDER — CLOBETASOL 0.05 % TOPICAL OINTMENT: g | Freq: Two times a day (BID) | 1 refills | 0 days | Status: AC

## 2018-09-07 MED ORDER — CLOBETASOL 0.05 % TOPICAL OINTMENT
Freq: Two times a day (BID) | TOPICAL | 3 refills | 0.00000 days | Status: CP
Start: 2018-09-07 — End: 2018-12-07

## 2018-09-09 ENCOUNTER — Encounter: Payer: Self-pay | Admitting: Emergency Medicine

## 2018-09-09 ENCOUNTER — Other Ambulatory Visit: Payer: Self-pay

## 2018-09-09 ENCOUNTER — Emergency Department
Admission: EM | Admit: 2018-09-09 | Discharge: 2018-09-09 | Disposition: A | Discharge status: Home / Self Care | Payer: Medicare HMO | Attending: Emergency Medicine | Admitting: Emergency Medicine

## 2018-09-09 DIAGNOSIS — R1032 Left lower quadrant pain: Secondary | ICD-10-CM | POA: Insufficient documentation

## 2018-09-09 DIAGNOSIS — R109 Unspecified abdominal pain: Secondary | ICD-10-CM

## 2018-09-09 DIAGNOSIS — Z79899 Other long term (current) drug therapy: Secondary | ICD-10-CM | POA: Diagnosis not present

## 2018-09-09 LAB — COMPREHENSIVE METABOLIC PANEL
ALBUMIN: 4.1 g/dL (ref 3.5–5.0)
ALT: 22 U/L (ref 0–44)
AST: 24 U/L (ref 15–41)
Alkaline Phosphatase: 70 U/L (ref 38–126)
Anion gap: 9 (ref 5–15)
BUN: 13 mg/dL (ref 6–20)
CHLORIDE: 105 mmol/L (ref 98–111)
CO2: 25 mmol/L (ref 22–32)
CREATININE: 0.82 mg/dL (ref 0.44–1.00)
Calcium: 9 mg/dL (ref 8.9–10.3)
GFR calc Af Amer: >60 mL/min (ref >60)
GLUCOSE: 125 mg/dL — AB (ref 70–99)
POTASSIUM: 3.3 mmol/L — AB (ref 3.5–5.1)
Sodium: 139 mmol/L (ref 135–145)
Total Bilirubin: 0.5 mg/dL (ref 0.3–1.2)
Total Protein: 7.2 g/dL (ref 6.5–8.1)

## 2018-09-09 LAB — CBC
HCT: 42.3 % (ref 36.0–46.0)
Hemoglobin: 14.3 g/dL (ref 12.0–15.0)
MCH: 28.7 pg (ref 26.0–34.0)
MCHC: 33.8 g/dL (ref 30.0–36.0)
MCV: 84.9 fL (ref 80.0–100.0)
Platelets: 399 K/uL (ref 150–400)
RBC: 4.98 MIL/uL (ref 3.87–5.11)
RDW: 12.9 % (ref 11.5–15.5)
WBC: 10.3 K/uL (ref 4.0–10.5)
nRBC: 0 % (ref 0.0–0.2)

## 2018-09-09 LAB — POCT PREGNANCY, URINE: Preg Test, Ur: NEGATIVE

## 2018-09-09 LAB — URINALYSIS, COMPLETE (UACMP) WITH MICROSCOPIC
Bilirubin Urine: NEGATIVE
Glucose, UA: NEGATIVE mg/dL
Ketones, ur: NEGATIVE mg/dL
Nitrite: NEGATIVE
Protein, ur: NEGATIVE mg/dL
RBC / HPF: >50 RBC/hpf — ABNORMAL HIGH (ref 0–5)
SPECIFIC GRAVITY, URINE: 1.021 (ref 1.005–1.030)
pH: 6 (ref 5.0–8.0)

## 2018-09-09 LAB — LIPASE, BLOOD: Lipase: 26 U/L (ref 11–51)

## 2018-09-09 MED ORDER — ONDANSETRON 4 MG PO TBDP
4.0000 mg | ORAL_TABLET | Freq: Once | ORAL | Status: AC
  Administered 2018-09-09: 4 mg via ORAL
  Filled 2018-09-09: qty 1

## 2018-09-09 MED ORDER — ONDANSETRON HCL 4 MG PO TABS: 4.0000 mg | ORAL_TABLET | Freq: Three times a day (TID) | ORAL | 0 refills | Status: DC | PRN

## 2018-09-09 MED ORDER — OXYCODONE-ACETAMINOPHEN 5-325 MG PO TABS
1.0000 | ORAL_TABLET | Freq: Once | ORAL | Status: AC
  Administered 2018-09-09: 1 via ORAL
  Filled 2018-09-09: qty 1

## 2018-09-09 MED ORDER — TAMSULOSIN HCL 0.4 MG PO CAPS: 0.4000 mg | ORAL_CAPSULE | Freq: Every day | ORAL | 0 refills | Status: DC

## 2018-09-09 MED ORDER — KETOROLAC TROMETHAMINE 30 MG/ML IJ SOLN
30.0000 mg | Freq: Once | INTRAMUSCULAR | Status: AC
  Administered 2018-09-09: 30 mg via INTRAMUSCULAR
  Filled 2018-09-09: qty 1

## 2018-09-09 MED ORDER — OXYCODONE-ACETAMINOPHEN 5-325 MG PO TABS: 1.0000 | ORAL_TABLET | ORAL | 0 refills | Status: DC | PRN

## 2018-09-09 MED ORDER — IBUPROFEN 200 MG PO TABS: 600.0000 mg | ORAL_TABLET | Freq: Four times a day (QID) | ORAL | 0 refills | Status: DC | PRN

## 2018-09-09 NOTE — ED Notes (Signed)
Pt reports for 2 days poor appetite has feeling of "cramping sensation and pulling sensation bilaterally flank areas, reports odor to urine denies any blood in urine reports dark in color. Pt able to void, last BM last night, reports nausea denies any episodes of emesis pt talks in complete sentences no distress noted

## 2018-09-09 NOTE — Discharge Instructions (Signed)
Your signs and symptoms are all consistent with kidney stone disease.  As we discussed, we will avoid another CT scan given the likelihood of excess radiation exposure.  We have asked that you follow-up with your urologist on Monday, and they can order imaging if at that time they think it is necessary.  Return to the emergency room for fever chills vomiting or any other new or worrisome symptoms including increased pain.  Do not drive on pain medications prescribed here.  Follow closely with urologist.

## 2018-09-09 NOTE — ED Triage Notes (Signed)
Patient presents to the ED with bilateral lower abdominal pain x 2 days.  Reports nausea, denies vomiting and diarrhea.  Patient states pain is sharp and fetal position helps relieve pain.

## 2018-09-09 NOTE — ED Notes (Signed)
No peripheral IV placed this visit.   Discharge instructions reviewed with patient. Questions fielded by this RN. Patient verbalizes understanding of instructions. Patient discharged home in stable condition per mcshane. No acute distress noted at time of discharge.    

## 2018-09-09 NOTE — ED Provider Notes (Signed)
[facility] Emergency Department Provider Note  ____________________________________________   I have reviewed the triage vital signs and the nursing notes. Where available I have reviewed prior notes and, if possible and indicated, outside hospital notes.    HISTORY  Chief Complaint Abdominal Pain    HPI Brittney Sanders is a 32 y.o. female  With a history of recurrent kidney stones always on the left side presents today with left-sided flank pain, going to her groin, and mild hematuria, no dysuria no urinary frequency, symptoms started yesterday.  Has not vomited or had any systemic illness such as fever.  No dysuria no urinary frequency, no vaginal discharge, pain is sharp, feels like prior kidney stone.  Patient has had for a CT abdomen pelvis in the last views for this exact same complaint she states.  This is been verified.  She does have a urologist.  She denies any numbness or weakness or cauda equina symptoms.  She has no fever, she has no other complaint except for a sharp left-sided flank pain which radiates towards her groin consistent with multiple prior kidney stones  Nothing makes it better and she has not taken any medication for it nothing makes it worse no other alleviating or aggravating symptoms, no prior treatment no other complaints is moderate  Past Medical History:  Diagnosis Date  . Depression   . Eczema   . Hearing deficit   . Heel spur   . Kidney stone   . Left nephrolithiasis   . Nocturia   . Obesity   . Overweight   . Plantar fasciitis   . Seasonal allergies   . UTI (lower urinary tract infection)     Patient Active Problem List   Diagnosis Date Noted  . Auditory disturbance 12/30/2015  . Adiposity 12/30/2015  . Renal stone 12/17/2015  . Pyelonephritis 12/17/2015  . 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 Calculus of kidney 08/16/2014  . Allergic rhinitis 04/03/2014  . Stress fracture of calcaneus 03/14/2014  . Calcaneal spur  02/23/2014  . Plantar fasciitis 02/23/2014    Past Surgical History:  Procedure Laterality Date  . TUBAL LIGATION    . TYMPANOSTOMY TUBE PLACEMENT Right     Prior to Admission medications   Medication Sig Start Date End Date Taking? Authorizing Provider  Acetaminophen 80 MG/0.8ML oral soluiton acetaminophen 80 mg    [provider]  amoxicillin (AMOXIL) 500 MG capsule  07/19/18   [provider]  azelastine (OPTIVAR) 0.05 % ophthalmic solution  10/16/15   [provider]  betamethasone dipropionate (DIPROLENE) 0.05 % ointment betamethasone dipropionate 0.05 % topical ointment    [provider]  cephALEXin (KEFLEX) 500 MG capsule cephalexin 500 mg capsule    [provider]  cetirizine (ZYRTEC) 10 MG tablet Take 10 mg by mouth daily.    [provider]  clindamycin (CLEOCIN) 300 MG capsule Take 1 capsule (300 mg total) by mouth 3 (three) times daily. 07/03/18   Irean Hong, MD  cyclobenzaprine (FLEXERIL) 5 MG tablet Take 1-2 tablets 3 times daily as needed 08/21/18   Enid Derry, PA-C  dicyclomine (BENTYL) 20 MG tablet Take 1 tablet (20 mg total) by mouth 3 (three) times daily as needed (abdominal pain). 12/22/17   Phineas Semen, MD  fexofenadine (ALLEGRA) 180 MG tablet Take 180 mg by mouth daily. 06/05/18   [provider]  HYDROcodone-acetaminophen (NORCO) 5-325 MG tablet Take 1 tablet by mouth every 6 (six) hours as needed for moderate pain.  07/03/18   Irean Hong, MD  ibuprofen (ADVIL,MOTRIN) 800 MG tablet Take 1 tablet (800 mg total) by mouth every 8 (eight) hours as needed for moderate pain. 07/03/18   Irean Hong, MD  Loratadine 10 MG CAPS  12/01/15   [provider]  mometasone (ELOCON) 0.1 % cream mometasone 0.1 % topical cream    [provider]  NALTREXONE HCL PO Take by mouth.    [provider]  nystatin (MYCOSTATIN/NYSTOP) powder  05/16/16   [provider]  ondansetron (ZOFRAN ODT)  4 MG disintegrating tablet Take 1 tablet (4 mg total) by mouth every 8 (eight) hours as needed for nausea or vomiting. 05/21/18   Irean Hong, MD  ondansetron (ZOFRAN) 4 MG tablet Take 1 tablet (4 mg total) by mouth every 8 (eight) hours as needed for nausea or vomiting. 12/22/17   Phineas Semen, MD  oxyCODONE-acetaminophen (ROXICET) 5-325 MG tablet Take 1 tablet by mouth every 4 (four) hours as needed for severe pain. 02/04/17   Irean Hong, MD  PARoxetine (PAXIL) 40 MG tablet Take 1 tablet by mouth daily. 10/06/15   [provider]  phentermine (ADIPEX-P) 37.5 MG tablet Take 1 tablet by mouth daily. 10/21/15   [provider]  topiramate (TOPAMAX SPRINKLE) 25 MG capsule Take 25 mg by mouth daily.    [provider]  triamcinolone lotion (KENALOG) 0.1 %  05/18/16   [provider]  VENTOLIN HFA 108 (90 Base) MCG/ACT inhaler  11/26/15   [provider]  Zinc Oxide 10 % OINT diaper rash ointment - 60 gm    [provider]    Allergies Sulfa antibiotics and Sulfamethoxazole  Family History  Problem Relation Age of Onset  . Urolithiasis Maternal Grandmother   . Kidney disease Neg Hx   . Bladder Cancer Neg Hx   . Kidney cancer Neg Hx     Social History Social History   Tobacco Use  . Smoking status: Never Smoker  . Smokeless tobacco: Never Used  Substance Use Topics  . Alcohol use: No    Alcohol/week: 0.0 standard drinks  . Drug use: No    Review of Systems Constitutional: No fever/chills Eyes: No visual changes. ENT: No sore throat. No stiff neck no neck pain Cardiovascular: Denies chest pain. Respiratory: Denies shortness of breath. Gastrointestinal:   no vomiting.  No diarrhea.  No constipation. Genitourinary: Negative for dysuria. Musculoskeletal: Negative lower extremity swelling Skin: Negative for rash. Neurological: Negative for severe headaches, focal weakness or  numbness.   ____________________________________________   PHYSICAL EXAM:  VITAL SIGNS: ED Triage Vitals  Enc Vitals Group     BP 09/09/18 1921 (!) 152/103     Pulse Rate 09/09/18 1921 96     Resp 09/09/18 1921 18     Temp 09/09/18 1921 98.6 F (37 C)     Temp Source 09/09/18 1921 Oral     SpO2 09/09/18 1921 99 %     Weight 09/09/18 1855 260 lb (117.9 kg)     Height 09/09/18 1855 5\' 3"  (1.6 m)     Head Circumference --      Peak Flow --      Pain Score 09/09/18 1855 8     Pain Loc --      Pain Edu? --      Excl. in GC? --     Constitutional: Alert and oriented. Well appearing and in no acute distress. Eyes: Conjunctivae are normal Head: Atraumatic  HEENT: No congestion/rhinnorhea. Mucous membranes are moist.  Oropharynx non-erythematous Neck:   Nontender with no meningismus, no masses, no stridor Cardiovascular: Normal rate, regular rhythm. Grossly normal heart sounds.  Good peripheral circulation. Respiratory: Normal respiratory effort.  No retractions. Lungs CTAB. Abdominal: Soft and nontender. No distention. No guarding no rebound Back:  There is no focal tenderness or step off.  there is no midline tenderness there are no lesions noted. there is mild left CVA tenderness Musculoskeletal: No lower extremity tenderness, no upper extremity tenderness. No joint effusions, no DVT signs strong distal pulses no edema Neurologic:  Normal speech and language. No gross focal neurologic deficits are appreciated.  Skin:  Skin is warm, dry and intact. No rash noted. Psychiatric: Mood and affect are normal. Speech and behavior are normal.  ____________________________________________   LABS (all labs ordered are listed, but only abnormal results are displayed)  Labs Reviewed  COMPREHENSIVE METABOLIC PANEL - Abnormal; Notable for the following components:      Result Value   Potassium 3.3 (*)    Glucose, Bld 125 (*)    All other components within normal limits  URINALYSIS,  COMPLETE (UACMP) WITH MICROSCOPIC - Abnormal; Notable for the following components:   Color, Urine YELLOW (*)    APPearance CLEAR (*)    Hgb urine dipstick LARGE (*)    Leukocytes, UA MODERATE (*)    RBC / HPF >50 (*)    Bacteria, UA RARE (*)    All other components within normal limits  LIPASE, BLOOD  CBC  POCT PREGNANCY, URINE  POC URINE PREG, ED    Pertinent labs  results that were available during my care of the patient were reviewed by me and considered in my medical decision making (see chart for details). ____________________________________________  EKG  I personally interpreted any EKGs ordered by me or triage  ____________________________________________  RADIOLOGY  Pertinent labs & imaging results that were available during my care of the patient were reviewed by me and considered in my medical decision making (see chart for details). If possible, patient and/or family made aware of any abnormal findings.  No results found. ____________________________________________    PROCEDURES  Procedure(s) performed: None  Procedures  Critical Care performed: None  ____________________________________________   INITIAL IMPRESSION / ASSESSMENT AND PLAN / ED COURSE  Pertinent labs & imaging results that were available during my care of the patient were reviewed by me and considered in my medical decision making (see chart for details).  Patient with a history of recurrent kidney stones presents today with hematuria, flank pain and signs and symptoms consistent with kidney stone.  She and I had a long talk about imaging.  In her opinion, she would prefer to avoid a repeat CT scan, which I agree with.  That would make 5 CT scans none of which have caused any significant change in management.  She is never had a stone that needed to be lysed.  We will treat her symptomatically, she is not pregnant she has no systemic illness symptoms.  We will have her follow closely with  PCP.  Patient is not driving and understands she cannot drive after the medications I am giving her here.  She also follow-up with urology.  Patient understands without CT scan there is some limited to what I can evaluate her for, however, at this time, kidney stone is by far the most likely diagnosis and there is no indication for her to get another CT scan for another kidney  stone.  She states that she knows that she has a stone in there from prior imaging.  She is very comfortable with this plan and does not have any concerns about it.  Considering the patient's symptoms, medical history, and physical examination today, I have low suspicion for cholecystitis or biliary pathology, pancreatitis, perforation or bowel obstruction, hernia, intra-abdominal abscess, AAA or dissection, volvulus or intussusception, mesenteric ischemia, ischemic gut, pyelonephritis or appendicitis.  Nor is or evidence of TOA, or PID or ovarian torsion or ovarian cyst    ____________________________________________   FINAL CLINICAL IMPRESSION(S) / ED DIAGNOSES  Final diagnoses:  None      This chart was dictated using voice recognition software.  Despite best efforts to proofread,  errors can occur which can change meaning.      Jeanmarie Plant, MD 09/09/18 2017

## 2018-09-11 LAB — URINE CULTURE

## 2018-09-13 ENCOUNTER — Ambulatory Visit
Admission: RE | Admit: 2018-09-13 | Discharge: 2018-09-13 | Disposition: A | Discharge status: Home / Self Care | Payer: Medicare HMO | Source: Ambulatory Visit | Attending: Urology | Admitting: Urology

## 2018-09-13 ENCOUNTER — Encounter: Payer: Self-pay | Admitting: Urology

## 2018-09-13 ENCOUNTER — Other Ambulatory Visit: Payer: Self-pay

## 2018-09-13 ENCOUNTER — Ambulatory Visit: Payer: Medicare HMO | Admitting: Urology

## 2018-09-13 VITALS — BP 126/80 | HR 76 | Ht 63.0 in | Wt 283.4 lb

## 2018-09-13 DIAGNOSIS — R109 Unspecified abdominal pain: Secondary | ICD-10-CM

## 2018-09-13 DIAGNOSIS — Z87448 Personal history of other diseases of urinary system: Secondary | ICD-10-CM | POA: Diagnosis not present

## 2018-09-13 DIAGNOSIS — N200 Calculus of kidney: Secondary | ICD-10-CM | POA: Diagnosis present

## 2018-09-13 DIAGNOSIS — R10A2 Flank pain, left side: Secondary | ICD-10-CM

## 2018-09-13 DIAGNOSIS — R3129 Other microscopic hematuria: Secondary | ICD-10-CM | POA: Diagnosis not present

## 2018-09-13 LAB — MICROSCOPIC EXAMINATION
RBC MICROSCOPIC, UA: NONE SEEN /HPF (ref 0–2)
WBC, UA: NONE SEEN /hpf (ref 0–5)

## 2018-09-13 LAB — URINALYSIS, COMPLETE
Bilirubin, UA: NEGATIVE
Glucose, UA: NEGATIVE
Ketones, UA: NEGATIVE
Leukocytes, UA: NEGATIVE
NITRITE UA: NEGATIVE
PH UA: 7.5 (ref 5.0–7.5)
PROTEIN UA: NEGATIVE
RBC, UA: NEGATIVE
Specific Gravity, UA: 1.02 (ref 1.005–1.030)
Urobilinogen, Ur: 0.2 mg/dL (ref 0.2–1.0)

## 2018-09-13 LAB — BLADDER SCAN AMB NON-IMAGING: Scan Result: 0

## 2018-09-13 NOTE — Progress Notes (Signed)
09/13/2018 10:50 AM   Brittney Sanders 11/29/1986 [id number]  Referring provider: Geryl Rankins, MD [address] [place], [place] [address]  Chief Complaint  Patient presents with  . Nephrolithiasis    follow up    HPI: Patient is a 32 year old Caucasian female with a history of vesicoureteral reflux grade 1, left nephrolithiasis and rUTI's who presents today for follow up with her husband, Brittney Sanders.    History of vesicoureteral reflux grade 1 VCUG in 12/2015 revealed Vesicoureteral reflux grade 1 -2.  She saw Dr. Marlou Sanders in 2017 and no treatment was recommended. RUS in 08/2017 revealed no hydronephrosis.  6 mm left renal calculus.    Left nephrolithiasis KUB today (09/13/2018) noted no stones  rUTI's No recent UTI"s.  Patient does not have symptoms of a UTI at this visit  She was seen in the ED on September 09, 2018 with a complaint of left-sided flank pain associated with hematuria.  Her UA was positive for >50RBC's and 11-20 WBC's.  Urine culture was positive for multiple species.   Her creatinine was 0.82.  She was given Toradol IM, ibuprofen po and Zofran po.    Today, she is complaining of urinary frequency.  She is having left flank pain that is radiating down her back.  Pain is 8/10.  It is constant pain.  She is having some nausea.  Patient denies any gross hematuria, dysuria or suprapubic.  Patient denies any fevers, chills or vomiting.     UA today is bland.   PMH: Past Medical History:  Diagnosis Date  . Depression   . Eczema   . Hearing deficit   . Heel spur   . Kidney stone   . Left nephrolithiasis   . Nocturia   . Obesity   . Overweight   . Plantar fasciitis   . Seasonal allergies   . UTI (lower urinary tract infection)     Surgical History: Past Surgical History:  Procedure Laterality Date  . TUBAL LIGATION    . TYMPANOSTOMY TUBE PLACEMENT Right     Home Medications:  Allergies as of 09/13/2018      Reactions   Sulfa  Antibiotics Hives, Rash   Sulfamethoxazole Rash      Medication List        Accurate as of 09/13/18 10:50 AM. Always use your most recent med list.          cetirizine 10 MG tablet Commonly known as:  ZYRTEC Take 10 mg by mouth daily.   clindamycin 300 MG capsule Commonly known as:  CLEOCIN Take 1 capsule (300 mg total) by mouth 3 (three) times daily.   ibuprofen 200 MG tablet Commonly known as:  ADVIL,MOTRIN Take 3 tablets (600 mg total) by mouth every 6 (six) hours as needed.   nystatin powder Commonly known as:  MYCOSTATIN/NYSTOP   ondansetron 4 MG disintegrating tablet Commonly known as:  ZOFRAN-ODT Take 1 tablet (4 mg total) by mouth every 8 (eight) hours as needed for nausea or vomiting.   ondansetron 4 MG tablet Commonly known as:  ZOFRAN Take 1 tablet (4 mg total) by mouth every 8 (eight) hours as needed for nausea or vomiting.   oxyCODONE-acetaminophen 5-325 MG tablet Commonly known as:  PERCOCET/ROXICET Take 1 tablet by mouth every 4 (four) hours as needed for severe pain.   PARoxetine 40 MG tablet Commonly known as:  PAXIL Take 1 tablet by mouth daily.   Zinc Oxide 10 % Oint diaper rash  ointment - 60 gm       Allergies:  Allergies  Allergen Reactions  . Sulfa Antibiotics Hives and Rash  . Sulfamethoxazole Rash    Family History: Family History  Problem Relation Age of Onset  . Urolithiasis Maternal Grandmother   . Kidney disease Neg Hx   . Bladder Cancer Neg Hx   . Kidney cancer Neg Hx     Social History:  reports that she has never smoked. She has never used smokeless tobacco. She reports that she does not drink alcohol or use drugs.  ROS: UROLOGY Frequent Urination?: No Hard to postpone urination?: No Burning/pain with urination?: No Get up at night to urinate?: No Urine stream starts and stops?: No Trouble starting stream?: No Do you have to strain to urinate?: No Blood in urine?: No Urinary tract infection?: No Sexually  transmitted disease?: No Injury to kidneys or bladder?: No Painful intercourse?: No Weak stream?: No Currently pregnant?: No Last menstrual period?: n  Gastrointestinal Nausea?: No Vomiting?: No Indigestion/heartburn?: No Diarrhea?: No Constipation?: No  Constitutional Fever: No Night sweats?: No Weight loss?: No  Skin Skin rash/lesions?: No Itching?: No  Eyes Blurred vision?: No Double vision?: No  Ears/Nose/Throat Sore throat?: No Sinus problems?: No  Hematologic/Lymphatic Swollen glands?: No Easy bruising?: No  Cardiovascular Leg swelling?: No Chest pain?: No  Respiratory Shortness of breath?: No  Endocrine Excessive thirst?: No  Musculoskeletal Back pain?: No Joint pain?: No  Neurological Headaches?: No Dizziness?: No  Psychologic Depression?: No Anxiety?: No  Physical Exam: BP 126/80 (BP Location: Left Arm, Patient Position: Sitting, Cuff Size: Large)   Pulse 76   Ht 5\' 3"  (1.6 m)   Wt 283 lb 6.4 oz (128.5 kg)   LMP 09/05/2018 (Approximate)   BMI 50.20 kg/m   Constitutional: Well nourished. Alert and oriented, No acute distress. HEENT: [place] AT, moist mucus membranes. Trachea midline, no masses. Cardiovascular: No clubbing, cyanosis, or edema. Respiratory: Normal respiratory effort, no increased work of breathing. GI: Obese, abdomen is soft, non tender, non distended, no abdominal masses. Liver and spleen not palpable.  No hernias appreciated.  Stool sample for occult testing is not indicated.   GU: No CVA tenderness.  No bladder fullness or masses.   Skin: No rashes, bruises or suspicious lesions. Lymph: No cervical or inguinal adenopathy. Neurologic: Grossly intact, no focal deficits, moving all 4 extremities. Psychiatric: Normal mood and affect.    Assessment & Plan:    1. Microscopic hematuria I had a long discussion with the patient and her husband regarding her numerous CTs in the past and the increase risk of developing  cancers in the future due to the radiation exposure  We cannot see a calculus on her KUB today and we could proceed with renal ultrasound but that may not give [place] the precise stone size, density and location for treatment Due to the blood in her urine, also explained that there is a risk of a urological cancer, albeit small, and that it would be best if we proceeded with a CT scan with/without contrast  Her and her husband both agree with proceeding with the CTU with the understanding risk of the possibility of developing cancer in the future due to the radiation exposure If the stone is present on the CTU, both her and her husband would like to undergo definitive treatment to rid her of the stone I explained to the patient that a contrast material will be injected into a vein and that in rare  instances, an allergic reaction can result and may even life threatening   The patient denies any allergies to contrast, iodine and/or seafood and is not taking metformin RTC for report Patient is advised that if they should start to experience pain that is not able to be controlled with pain medication, intractable nausea and/or vomiting and/or fevers greater than 103 or shaking chills to contact the office immediately or seek treatment in the emergency department for emergent intervention.     2. Left flank pain CTU pending Patient wanted to move forward with treatment if stone is present on CT  3. History of nephrolithiasis Patient with a history of a left renal stone CTU pending  4. History of reflux See above  Michiel Cowboy, [facility] [address] [place], [place] [address] [phone]

## 2018-09-18 LAB — CULTURE, URINE COMPREHENSIVE

## 2018-09-19 ENCOUNTER — Ambulatory Visit: Payer: Medicare HMO | Admitting: Urology

## 2018-09-20 ENCOUNTER — Ambulatory Visit: Payer: Medicare HMO | Admitting: Urology

## 2018-09-21 ENCOUNTER — Ambulatory Visit: Payer: Medicare HMO | Admitting: Urology

## 2018-09-29 ENCOUNTER — Ambulatory Visit
Admission: RE | Admit: 2018-09-29 | Discharge: 2018-09-29 | Disposition: A | Discharge status: Home / Self Care | Payer: Medicare HMO | Source: Ambulatory Visit | Attending: Urology | Admitting: Urology

## 2018-09-29 DIAGNOSIS — N200 Calculus of kidney: Secondary | ICD-10-CM | POA: Diagnosis not present

## 2018-09-29 DIAGNOSIS — M5137 Other intervertebral disc degeneration, lumbosacral region: Secondary | ICD-10-CM | POA: Insufficient documentation

## 2018-09-29 DIAGNOSIS — R3129 Other microscopic hematuria: Secondary | ICD-10-CM | POA: Insufficient documentation

## 2018-09-29 DIAGNOSIS — N289 Disorder of kidney and ureter, unspecified: Secondary | ICD-10-CM | POA: Insufficient documentation

## 2018-09-29 MED ORDER — IOPAMIDOL (ISOVUE-300) INJECTION 61%
125.0000 mL | Freq: Once | INTRAVENOUS | Status: AC | PRN
  Administered 2018-09-29: 125 mL via INTRAVENOUS

## 2018-10-03 ENCOUNTER — Encounter: Payer: Self-pay | Admitting: Family Medicine

## 2018-10-03 ENCOUNTER — Ambulatory Visit (INDEPENDENT_AMBULATORY_CARE_PROVIDER_SITE_OTHER): Payer: Medicare HMO | Admitting: Family Medicine

## 2018-10-03 VITALS — BP 132/85 | HR 80 | Ht 63.0 in | Wt 268.0 lb

## 2018-10-03 DIAGNOSIS — R109 Unspecified abdominal pain: Secondary | ICD-10-CM

## 2018-10-03 LAB — URINALYSIS, COMPLETE
BILIRUBIN UA: NEGATIVE
GLUCOSE, UA: NEGATIVE
NITRITE UA: NEGATIVE
RBC UA: NEGATIVE
SPEC GRAV UA: 1.025 (ref 1.005–1.030)
UUROB: 1 mg/dL (ref 0.2–1.0)
pH, UA: 7 (ref 5.0–7.5)

## 2018-10-03 LAB — MICROSCOPIC EXAMINATION

## 2018-10-03 NOTE — Progress Notes (Signed)
Patient presents today with flanl pain on both sides. She is aware of a kidney stone and has already done a CT scan. A urine was collected today for UA, UCX. Patient states she has not been on ABX or had any urological surgeries in the last 30 days. Carollee Herter reviewed the CT scan and the UA, per results we are not prescribing abx at this time. We are going to see what the UCX grows.

## 2018-10-05 LAB — CULTURE, URINE COMPREHENSIVE

## 2018-10-12 NOTE — Progress Notes (Incomplete)
10/13/18 6:07 PM   Brittney Sanders [birth year] [id number]  Referring provider: Geryl RankinsBurns, Marianthe M, MD [address] [place], [place] [id number]  No chief complaint on file.   HPI: Patient is a 32 year old Caucasian female with a history of vesicoureteral reflux grade 1, left nephrolithiasis and rUTI's who presents today for follow up with her husband, Chrissie NoaWilliam.  ***  Pt reports ***  History of vesicoureteral reflux grade 1 VCUG in 12/2015 revealed Vesicoureteral reflux grade 1 -2.  She saw Dr. Marlou PorchHerrick in 2017 and no treatment was recommended. RUS in 08/2017 revealed no hydronephrosis.  6 mm left renal calculus.    Left nephrolithiasis KUB today (09/13/2018) noted no stones ***  rUTI's No recent UTI"s.  Patient does not have symptoms of a UTI at this visit  She was seen in the ED on September 09, 2018 with a complaint of left-sided flank pain associated with hematuria.  Her UA was positive for >50RBC's and 11-20 WBC's.  Urine culture was positive for multiple species.   Her creatinine was 0.82.  She was given Toradol IM, ibuprofen po and Zofran po.    Today, she is complaining of urinary frequency.  She is having left flank pain that is radiating down her back.  Pain is 8/10.  It is constant pain.  She is having some nausea.  Patient denies any gross hematuria, dysuria or suprapubic.  Patient denies any fevers, chills or vomiting.     UA today is bland.   PMH: Past Medical History:  Diagnosis Date   Depression    Eczema    Hearing deficit    Heel spur    Kidney stone    Left nephrolithiasis    Nocturia    Obesity    Overweight    Plantar fasciitis    Seasonal allergies    UTI (lower urinary tract infection)     Surgical History: Past Surgical History:  Procedure Laterality Date   TUBAL LIGATION     TYMPANOSTOMY TUBE PLACEMENT Right     Home Medications:  Allergies as of 10/13/2018      Reactions   Sulfa Antibiotics Hives, Rash   Sulfamethoxazole Rash      Medication List        Accurate as of 10/12/18  6:07 PM. Always use your most recent med list.          cetirizine 10 MG tablet Commonly known as:  ZYRTEC Take 10 mg by mouth daily.   clindamycin 300 MG capsule Commonly known as:  CLEOCIN Take 1 capsule (300 mg total) by mouth 3 (three) times daily.   ibuprofen 200 MG tablet Commonly known as:  ADVIL,MOTRIN Take 3 tablets (600 mg total) by mouth every 6 (six) hours as needed.   nystatin powder Commonly known as:  MYCOSTATIN/NYSTOP   ondansetron 4 MG disintegrating tablet Commonly known as:  ZOFRAN-ODT Take 1 tablet (4 mg total) by mouth every 8 (eight) hours as needed for nausea or vomiting.   ondansetron 4 MG tablet Commonly known as:  ZOFRAN Take 1 tablet (4 mg total) by mouth every 8 (eight) hours as needed for nausea or vomiting.   oxyCODONE-acetaminophen 5-325 MG tablet Commonly known as:  PERCOCET/ROXICET Take 1 tablet by mouth every 4 (four) hours as needed for severe pain.   PARoxetine 40 MG tablet Commonly known as:  PAXIL Take 1 tablet by mouth daily.   Zinc Oxide 10 % Oint diaper rash ointment - 60 gm  Allergies:  Allergies  Allergen Reactions   Sulfa Antibiotics Hives and Rash   Sulfamethoxazole Rash    Family History: Family History  Problem Relation Age of Onset   Urolithiasis Maternal Grandmother    Kidney disease Neg Hx    Bladder Cancer Neg Hx    Kidney cancer Neg Hx     Social History:  reports that she has never smoked. She has never used smokeless tobacco. She reports that she does not drink alcohol or use drugs.  ROS:                                        Physical Exam: There were no vitals taken for this visit.  Constitutional: Well nourished. Alert and oriented, No acute distress. HEENT: [place] AT, moist mucus membranes. Trachea midline, no masses. Cardiovascular: No clubbing, cyanosis, or edema. Respiratory:  Normal respiratory effort, no increased work of breathing. GI: Abdomen is soft, non tender, non distended, no abdominal masses. Liver and spleen not palpable.  No hernias appreciated.  Stool sample for occult testing is not indicated.   GU: No CVA tenderness.  No bladder fullness or masses.  Normal external genitalia, normal pubic [place] distribution, no lesions.  Normal urethral meatus, no lesions, no prolapse, no discharge.   No urethral masses, tenderness and/or tenderness. No bladder fullness, tenderness or masses. Normal vagina mucosa, good estrogen effect, no discharge, no lesions, good pelvic support, no cystocele or rectocele noted.  No cervical motion tenderness.  Uterus is freely mobile and non-fixed.  No adnexal/parametria masses or tenderness noted.  Anus and perineum are without rashes or lesions.   *** Skin: No rashes, bruises or suspicious lesions. Lymph: No cervical or inguinal adenopathy. Neurologic: Grossly intact, no focal deficits, moving all 4 extremities. Psychiatric: Normal mood and affect.  Pertinent Imaging:  CLINICAL DATA:  Hematuria workup  EXAM: CT ABDOMEN AND PELVIS WITHOUT AND WITH CONTRAST  TECHNIQUE: Multidetector CT imaging of the abdomen and pelvis was performed following the standard protocol before and following the bolus administration of intravenous contrast.  CONTRAST:  ISOVUE-300 IOPAMIDOL (ISOVUE-300) INJECTION 61%  COMPARISON:  None.  FINDINGS: Lower chest: No acute abnormality.  Hepatobiliary: No focal liver abnormality is seen. No gallstones, gallbladder wall thickening, or biliary dilatation.  Pancreas: Unremarkable. No pancreatic ductal dilatation or surrounding inflammatory changes.  Spleen: Normal in size without focal abnormality.  Adrenals/Urinary Tract: The adrenal glands appear normal.  Left renal cortical scarring identified. Stone within the inferior pole collecting system of the left kidney measures 2-3 mm,  image 36/2. No kidney mass or hydronephrosis identified bilaterally. The ureters are patent bilaterally. Urinary bladder appears normal. No bladder calculi.  Stomach/Bowel: Stomach is within normal limits. Appendix appears normal. No evidence of bowel wall thickening, distention, or inflammatory changes.  Vascular/Lymphatic: No significant vascular findings are present. No enlarged abdominal or pelvic lymph nodes.  Reproductive: Uterus and bilateral adnexa are unremarkable.  Other: No abdominal wall hernia or abnormality. No abdominopelvic ascites.  Musculoskeletal: No acute or significant osseous findings. Degenerative disc disease with dorsal endplate spurring identified at L5-S1.  IMPRESSION: 1. No acute findings. 2. Small nonobstructing calculus is identified within the inferior pole of the left kidney. 3. Extensive left kidney scarring with diffuse cortical lobulation. 4. L5-S1 degenerative disc disease.   Electronically Signed   By: Signa Kell M.D.   On: 09/29/2018 14:25 CLINICAL DATA:  Hematuria  workup  EXAM: CT ABDOMEN AND PELVIS WITHOUT AND WITH CONTRAST  TECHNIQUE: Multidetector CT imaging of the abdomen and pelvis was performed following the standard protocol before and following the bolus administration of intravenous contrast.  CONTRAST:  ISOVUE-300 IOPAMIDOL (ISOVUE-300) INJECTION 61%  COMPARISON:  None.  FINDINGS: Lower chest: No acute abnormality.  Hepatobiliary: No focal liver abnormality is seen. No gallstones, gallbladder wall thickening, or biliary dilatation.  Pancreas: Unremarkable. No pancreatic ductal dilatation or surrounding inflammatory changes.  Spleen: Normal in size without focal abnormality.  Adrenals/Urinary Tract: The adrenal glands appear normal.  Left renal cortical scarring identified. Stone within the inferior pole collecting system of the left kidney measures 2-3 mm, image 36/2. No kidney  mass or hydronephrosis identified bilaterally. The ureters are patent bilaterally. Urinary bladder appears normal. No bladder calculi.  Stomach/Bowel: Stomach is within normal limits. Appendix appears normal. No evidence of bowel wall thickening, distention, or inflammatory changes.  Vascular/Lymphatic: No significant vascular findings are present. No enlarged abdominal or pelvic lymph nodes.  Reproductive: Uterus and bilateral adnexa are unremarkable.  Other: No abdominal wall hernia or abnormality. No abdominopelvic ascites.  Musculoskeletal: No acute or significant osseous findings. Degenerative disc disease with dorsal endplate spurring identified at L5-S1.  IMPRESSION: 1. No acute findings. 2. Small nonobstructing calculus is identified within the inferior pole of the left kidney. 3. Extensive left kidney scarring with diffuse cortical lobulation. 4. L5-S1 degenerative disc disease.   Electronically Signed   By: Signa Kell M.D.   On: 09/29/2018 14:25  Assessment & Plan:    1. Microscopic hematuria I had a long discussion with the patient and her husband regarding her numerous CTs in the past and the increase risk of developing cancers in the future due to the radiation exposure  We cannot see a calculus on her KUB today and we could proceed with renal ultrasound but that may not give [place] the precise stone size, density and location for treatment Due to the blood in her urine, also explained that there is a risk of a urological cancer, albeit small, and that it would be best if we proceeded with a CT scan with/without contrast  Her and her husband both agree with proceeding with the CTU with the understanding risk of the possibility of developing cancer in the future due to the radiation exposure If the stone is present on the CTU, both her and her husband would like to undergo definitive treatment to rid her of the stone I explained to the patient that a  contrast material will be injected into a vein and that in rare instances, an allergic reaction can result and may even life threatening   The patient denies any allergies to contrast, iodine and/or seafood and is not taking metformin RTC for report Patient is advised that if they should start to experience pain that is not able to be controlled with pain medication, intractable nausea and/or vomiting and/or fevers greater than 103 or shaking chills to contact the office immediately or seek treatment in the emergency department for emergent intervention.     2. Left flank pain CTU pending Patient wanted to move forward with treatment if stone is present on CT  3. History of nephrolithiasis Patient with a history of a left renal stone CTU pending *** 4. History of reflux See above  [facility] [address], [address] [place], [place] [address] [phone]  I, Donne Hazel, am acting as a Neurosurgeon  for [employer] PA-C,  {Add Scribe Attestation Statement}

## 2018-10-13 ENCOUNTER — Ambulatory Visit: Payer: Medicare HMO | Admitting: Urology

## 2018-10-18 ENCOUNTER — Encounter: Payer: Self-pay | Admitting: Urology

## 2018-10-18 ENCOUNTER — Ambulatory Visit: Payer: Medicare HMO | Admitting: Urology

## 2018-10-18 VITALS — BP 123/77 | HR 75 | Ht 63.0 in | Wt 286.4 lb

## 2018-10-18 DIAGNOSIS — Z87448 Personal history of other diseases of urinary system: Secondary | ICD-10-CM

## 2018-10-18 DIAGNOSIS — R3129 Other microscopic hematuria: Secondary | ICD-10-CM

## 2018-10-18 DIAGNOSIS — R109 Unspecified abdominal pain: Secondary | ICD-10-CM

## 2018-10-18 DIAGNOSIS — N200 Calculus of kidney: Secondary | ICD-10-CM

## 2018-10-18 NOTE — Progress Notes (Signed)
10/18/2018 10:11 AM   Brittney Sanders 1986-05-06 [id number]  Referring provider: Geryl Rankins, MD [address] [place], [place] [address]  Chief Complaint  Patient presents with  . Follow-up  . CTU Report    HPI:  Pt is a 32 y.o. Caucasian female with a history of microscopic hematuria, left flank pain, hx of nephrolithiasis, and hx of reflux following up for CTU report. She presents today with her family member.   She hasn't had a cystoscopy procedure in the past. Her most recent urinalysis that showed hematuria was on 09/09/2018.   She was informed that due to her DJD, she will not be able to have surgery and will be treated with medications. She will have follow up in 1 month with the orthopedist.   Pt reports associated left flank pain. Pt denies any other symptoms.   History of vesicoureteral reflux grade 1 VCUG in 12/2015 revealed Vesicoureteral reflux grade 1 -2.  She saw Dr. Marlou Porch in 2017 and no treatment was recommended. RUS in 08/2017 revealed no hydronephrosis.  6 mm left renal calculus.  Since her last visit, she had a CT hematuria workup on 09/29/2018 that showed: Small nonobstructing calculus is identified within the inferior pole of the left kidney. Extensive left kidney scarring with diffuse cortical lobulation.  Left nephrolithiasis KUB today (09/13/2018) noted no stones. Since her last visit, she had a CT hematuria workup on 09/29/2018 that showed: Small nonobstructing calculus is identified within the inferior pole of the left kidney. Extensive left kidney scarring with diffuse cortical lobulation.   PMH: Past Medical History:  Diagnosis Date  . Depression   . Eczema   . Hearing deficit   . Heel spur   . Kidney stone   . Left nephrolithiasis   . Nocturia   . Obesity   . Overweight   . Plantar fasciitis   . Seasonal allergies   . UTI (lower urinary tract infection)     Surgical History: Past Surgical History:  Procedure  Laterality Date  . TUBAL LIGATION    . TYMPANOSTOMY TUBE PLACEMENT Right     Home Medications:  Allergies as of 10/18/2018      Reactions   Sulfa Antibiotics Hives, Rash   Sulfamethoxazole Rash      Medication List        Accurate as of 10/18/18 10:11 AM. Always use your most recent med list.          cetirizine 10 MG tablet Commonly known as:  ZYRTEC Take 10 mg by mouth daily.   meloxicam 15 MG tablet Commonly known as:  MOBIC Take 15 mg by mouth daily.   nystatin powder Commonly known as:  MYCOSTATIN/NYSTOP   ondansetron 4 MG tablet Commonly known as:  ZOFRAN Take 1 tablet (4 mg total) by mouth every 8 (eight) hours as needed for nausea or vomiting.   oxyCODONE-acetaminophen 5-325 MG tablet Commonly known as:  PERCOCET/ROXICET Take 1 tablet by mouth every 4 (four) hours as needed for severe pain.   PARoxetine 40 MG tablet Commonly known as:  PAXIL Take 1 tablet by mouth daily.   Zinc Oxide 10 % Oint diaper rash ointment - 60 gm       Allergies:  Allergies  Allergen Reactions  . Sulfa Antibiotics Hives and Rash  . Sulfamethoxazole Rash    Family History: Family History  Problem Relation Age of Onset  . Urolithiasis Maternal Grandmother   . Kidney disease Neg Hx   .  Bladder Cancer Neg Hx   . Kidney cancer Neg Hx     Social History:  reports that she has never smoked. She has never used smokeless tobacco. She reports that she does not drink alcohol or use drugs.  ROS: UROLOGY Frequent Urination?: No Hard to postpone urination?: No Burning/pain with urination?: No Get up at night to urinate?: No Leakage of urine?: No Urine stream starts and stops?: No Trouble starting stream?: No Do you have to strain to urinate?: No Blood in urine?: No Urinary tract infection?: No Sexually transmitted disease?: No Injury to kidneys or bladder?: No Painful intercourse?: No Weak stream?: No Currently pregnant?: No Vaginal bleeding?: No Last menstrual  period?: 09/29/18  Gastrointestinal Nausea?: No Vomiting?: No Indigestion/heartburn?: No Constipation?: No  Constitutional Fever: No Night sweats?: No Weight loss?: No Fatigue?: No  Skin Skin rash/lesions?: No Itching?: No  Eyes Blurred vision?: No Double vision?: No  Ears/Nose/Throat Sore throat?: No Sinus problems?: No  Hematologic/Lymphatic Swollen glands?: No Easy bruising?: No  Cardiovascular Leg swelling?: No Chest pain?: No  Respiratory Cough?: No Shortness of breath?: No  Endocrine Excessive thirst?: No  Musculoskeletal Back pain?: No Joint pain?: No  Neurological Headaches?: No Dizziness?: No  Psychologic Depression?: No Anxiety?: No  Physical Exam:   BP 123/77 (BP Location: Left Arm, Patient Position: Sitting, Cuff Size: Large)   Pulse 75   Ht 5\' 3"  (1.6 m)   Wt 286 lb 6.4 oz (129.9 kg)   LMP 09/29/2018   BMI 50.73 kg/m   Constitutional: Well nourished. Alert and oriented, No acute distress. HEENT: [place] AT, moist mucus membranes. Trachea midline, no masses. Cardiovascular: No clubbing, cyanosis, or edema. Respiratory: Normal respiratory effort, no increased work of breathing. Skin: No rashes, bruises or suspicious lesions. Neurologic: Grossly intact, no focal deficits, moving all 4 extremities. Psychiatric: Normal mood and affect.    Assessment & Plan:    1. Microscopic hematuria -Discussed with the patient regarding recent lab results on 09/09/2018 which showed microscopic hematuria. Per patient, she wasn't on her menstrual cycle at this time.  -Culture was multiple species, likely contamination.   -I have explained to the patient that they will  be scheduled for a cystoscopy in our office to evaluate their bladder.  The cystoscopy consists of passing a tube with a lens up through their urethra and into their urinary bladder.   We will inject the urethra with a lidocaine gel prior to introducing the cystoscope to help with any  discomfort during the procedure.   After the procedure, they might experience blood in the urine and discomfort with urination.  This will abate after the first few voids.  I have  encouraged the patient to increase water intake  during this time.  Patient denies any allergies to lidocaine.       2. Left flank pain, likely musculoskeletal  -CT hematuria workup on 09/29/2018 showed: Small nonobstructing calculus is identified within the inferior pole of the left kidney. Extensive left kidney scarring with diffuse cortical lobulation  3. History of nephrolithiasis -Patient with a history of a left renal stone -Reviewed CT Hematuria Workup image with patient and family member today. Discussed with the patient that due to the location of the kidney stone in the parachymal tissue, we will not proceed with surgery at this time. Patient voices understanding at this time.    4. History of reflux See above  Kendle Erker, Elana AlmShannon A, PA-C  [facility] [address], [address] [place],  [place] [address] [phone]) [phone]   I, Soijett Blue, am acting as a Neurosurgeon for [employer], [employer].   I have reviewed the above documentation for accuracy and completeness, and I agree with the above.    Michiel Cowboy, PA-C

## 2018-11-03 ENCOUNTER — Encounter: Payer: Self-pay | Admitting: Urology

## 2018-11-03 ENCOUNTER — Other Ambulatory Visit: Payer: Self-pay

## 2018-11-03 ENCOUNTER — Ambulatory Visit: Payer: Medicare HMO | Admitting: Urology

## 2018-11-03 VITALS — BP 134/82 | HR 69 | Ht 63.0 in | Wt 288.8 lb

## 2018-11-03 DIAGNOSIS — R3129 Other microscopic hematuria: Secondary | ICD-10-CM | POA: Diagnosis not present

## 2018-11-03 LAB — MICROSCOPIC EXAMINATION: RBC, UA: NONE SEEN /hpf (ref 0–2)

## 2018-11-03 LAB — URINALYSIS, COMPLETE
Bilirubin, UA: NEGATIVE
GLUCOSE, UA: NEGATIVE
Ketones, UA: NEGATIVE
NITRITE UA: NEGATIVE
PROTEIN UA: NEGATIVE
RBC, UA: NEGATIVE
Specific Gravity, UA: 1.02 (ref 1.005–1.030)
Urobilinogen, Ur: 0.2 mg/dL (ref 0.2–1.0)
pH, UA: 7 (ref 5.0–7.5)

## 2018-11-03 NOTE — Progress Notes (Signed)
   11/03/2018   CC:  Chief Complaint  Patient presents with  . Cysto    HPI: Brittney Sanders is a [age] yo F with a personal history of microscopic hematuria who presents today for a cystoscopy. Pt reports of pain in the lower back.  There were no vitals taken for this visit. NED. A&Ox3.   No respiratory distress   Abd soft, NT, ND Normal external genitalia with patent urethral meatus  Cystoscopy Procedure Note  Patient identification was confirmed, informed consent was obtained, and patient was prepped using Betadine solution.  Lidocaine jelly was administered per urethral meatus.    Procedure: - Flexible cystoscope introduced, without any difficulty.   - Thorough search of the bladder revealed:    normal urethral meatus    normal urothelium    no stones    no ulcers     no tumors    no urethral polyps    no trabeculation  - Ureteral orifices were normal in position and appearance.  Post-Procedure: - Patient tolerated the procedure well  Assessment/ Plan: 1. Microscopic hematuria  -cystoscopy was negative  -F/u with Brittney HerterShannon in 4 months    Riki AltesScott C Gianella Chismar, MD  I, Donne HazelNethusan Sivanesan, am acting as a scribe for Dr. Lorin PicketScott C. Dema Timmons,  I, Riki AltesScott C Alexander Mcauley, MD, have reviewed all documentation for this visit. The documentation on 11/03/18 for the exam, diagnosis, procedures, and orders are all accurate and complete.

## 2018-12-07 ENCOUNTER — Ambulatory Visit: Admit: 2018-12-07 | Discharge: 2018-12-08 | Payer: MEDICARE

## 2018-12-07 DIAGNOSIS — L309 Dermatitis, unspecified: Secondary | ICD-10-CM

## 2018-12-07 MED ORDER — CLOBETASOL 0.05 % TOPICAL CREAM
5 refills | 0 days | Status: CP
Start: 2018-12-07 — End: 2019-06-26

## 2018-12-07 MED ORDER — HYDROCORTISONE 2.5 % TOPICAL CREAM
Freq: Two times a day (BID) | TOPICAL | 2 refills | 0 days | Status: CP
Start: 2018-12-07 — End: 2019-12-07

## 2018-12-07 MED ORDER — CLOBETASOL 0.05 % TOPICAL OINTMENT
5 refills | 0 days | Status: CP
Start: 2018-12-07 — End: 2019-06-26

## 2018-12-20 ENCOUNTER — Telehealth: Payer: Self-pay | Admitting: Urology

## 2018-12-20 NOTE — Telephone Encounter (Signed)
Pt called office today thinking she has an infection.  I advised pt to drink lots of water and come in tomorrow morning.  Her only symptom was her urine has an odor.  She is on nurse schedule first thing in the morning.

## 2018-12-21 ENCOUNTER — Ambulatory Visit: Payer: Medicare HMO

## 2018-12-21 ENCOUNTER — Encounter: Payer: Self-pay | Admitting: Urology

## 2018-12-21 ENCOUNTER — Ambulatory Visit: Payer: Medicare HMO | Admitting: Urology

## 2018-12-21 NOTE — Progress Notes (Incomplete)
12/21/2018 8:14 AM   Buford Judie Petit Mahadeo 1986/03/30 [id number]  Referring provider: Hyman Hopes, MD [address] [place], [place] [address]  No chief complaint on file.   HPI:  Brittney Sanders is a 33 y.o. female Caucasian with a history of microscopic hematuria, left flank pain, hx of nephrolithiasis, and hx of reflux presenting for a possible UTI.  Background History She hasn't had a cystoscopy procedure in the past. Her most recent urinalysis that showed hematuria was on 09/09/2018.  She was informed that due to her DJD, she will not be able to have surgery and will be treated with medications.  On 10/18/2018, patient reported associated left flank pain and denied any other symptoms.  History of vesicoureteral reflux grade 1 VCUG in 12/2015 revealed Vesicoureteral reflux grade 1 -2.  She saw Dr. Marlou Porch in 2017 and no treatment was recommended. RUS in 08/2017 revealed no hydronephrosis.  6 mm left renal calculus.  Since her last visit, she had a CT hematuria workup on 09/29/2018 that showed: Small nonobstructing calculus is identified within the inferior pole of the left kidney. Extensive left kidney scarring with diffuse cortical lobulation.  Left nephrolithiasis KUB on 09/13/2018 noted no stones. She had a CT hematuria workup on 09/29/2018 that showed: Small nonobstructing calculus is identified within the inferior pole of the left kidney. Extensive left kidney scarring with diffuse cortical lobulation.  ***  PMH: Past Medical History:  Diagnosis Date   Depression    Eczema    Hearing deficit    Heel spur    Kidney stone    Left nephrolithiasis    Nocturia    Obesity    Overweight    Plantar fasciitis    Seasonal allergies    UTI (lower urinary tract infection)     Surgical History: Past Surgical History:  Procedure Laterality Date   TUBAL LIGATION     TYMPANOSTOMY TUBE PLACEMENT Right     Home Medications:  Allergies as of 12/21/2018    Reactions   Sulfa Antibiotics Hives, Rash   Sulfamethoxazole Rash      Medication List       Accurate as of December 21, 2018  8:14 AM. Always use your most recent med list.        fexofenadine 180 MG tablet Commonly known as:  ALLEGRA Take 180 mg by mouth daily.   meloxicam 15 MG tablet Commonly known as:  MOBIC Take 15 mg by mouth daily.   nystatin powder Commonly known as:  MYCOSTATIN/NYSTOP   ondansetron 4 MG tablet Commonly known as:  ZOFRAN Take 1 tablet (4 mg total) by mouth every 8 (eight) hours as needed for nausea or vomiting.   PARoxetine 40 MG tablet Commonly known as:  PAXIL Take 1 tablet by mouth daily.       Allergies:  Allergies  Allergen Reactions   Sulfa Antibiotics Hives and Rash   Sulfamethoxazole Rash    Family History: Family History  Problem Relation Age of Onset   Urolithiasis Maternal Grandmother    Kidney disease Neg Hx    Bladder Cancer Neg Hx    Kidney cancer Neg Hx     Social History:  reports that she has never smoked. She has never used smokeless tobacco. She reports that she does not drink alcohol or use drugs.  ROS:  Physical Exam:   There were no vitals taken for this visit.  Constitutional: Well nourished. Alert and oriented, No acute distress. {HEENT: [place] AT, moist mucus membranes.  Trachea midline, no masses.} Cardiovascular: No clubbing, cyanosis, or edema. Respiratory: Normal respiratory effort, no increased work of breathing. {GI: Abdomen is soft, non tender, non distended, no abdominal masses. Liver and spleen not palpable.  No hernias appreciated.  Stool sample for occult testing is not indicated.} {GU: No CVA tenderness.  No bladder fullness or masses.  *** external genitalia, *** pubic Mattera distribution, no lesions.  Normal urethral meatus, no lesions, no prolapse, no discharge.   No urethral masses, tenderness and/or tenderness. No bladder  fullness, tenderness or masses. *** vagina mucosa, *** estrogen effect, no discharge, no lesions, *** pelvic support, *** cystocele and *** rectocele noted.  No cervical motion tenderness.  Uterus is freely mobile and non-fixed.  No adnexal/parametria masses or tenderness noted.  Anus and perineum are without rashes or lesions.   *** } Skin: No rashes, bruises or suspicious lesions. {Lymph: No cervical or inguinal adenopathy.} Neurologic: Grossly intact, no focal deficits, moving all 4 extremities. Psychiatric: Normal mood and affect.   Assessment & Plan:    *** 1. Microscopic hematuria -Discussed with the patient regarding recent lab results on 09/09/2018 which showed microscopic hematuria. Per patient, she wasn't on her menstrual cycle at this time.  -Culture was multiple species, likely contamination. -I have explained to the patient that they will  be scheduled for a cystoscopy in our office to evaluate their bladder.  The cystoscopy consists of passing a tube with a lens up through their urethra and into their urinary bladder.   We will inject the urethra with a lidocaine gel prior to introducing the cystoscope to help with any discomfort during the procedure.   After the procedure, they might experience blood in the urine and discomfort with urination.  This will abate after the first few voids.  I have  encouraged the patient to increase water intake  during this time.  Patient denies any allergies to lidocaine.   2. Left flank pain, likely musculoskeletal  *** - CT hematuria workup on 09/29/2018 showed: Small nonobstructing calculus is identified within the inferior pole of the left kidney. Extensive left kidney scarring with diffuse cortical lobulation  3. History of nephrolithiasis *** - Patient with a history of a left renal stone  4. History of reflux *** - See above  Marvel Plan Elana Alm  [facility] [address], [address] [place], [place]  [address] [phone]  I, Duanne Moron, am acting as a Neurosurgeon for [employer], PA-C.   {Add Scribe Attestation Statement}

## 2018-12-28 ENCOUNTER — Ambulatory Visit: Payer: Medicare HMO | Admitting: Gastroenterology

## 2018-12-28 ENCOUNTER — Encounter: Payer: Self-pay | Admitting: *Deleted

## 2018-12-28 NOTE — Progress Notes (Deleted)
Gastroenterology Consultation  Referring Provider:     Linus Sanders, Chapman, Sanders Primary Care Physician:  Brittney Sanders, Brittney Sanders, Sanders Primary Gastroenterologist:  Brittney Sanders     Reason for Consultation:     Odynophagia        HPI:   Brittney Sanders is Sanders 33 y.o. y/o female referred for consultation & management of Sanders odynophagia by Dr. Lawerance Sanders, Brittney CleverlyHarriett Sanders, Sanders.  This patient comes in with Sanders history of recurrent UTIs and kidney stones.  She is being followed by urology for this.  She has also had multiple CT scans and ER visits for the same.  The patient was seen by ENT for painful swallowing.  She had reported that she was having severe pain and burning in her throat when she swallowed.  She was seen by ENT back in August of last year.  At that time she had stated that the symptoms were causing her to have Sanders change in her voice and burning in her throat.    Past Medical History:  Diagnosis Date  . Depression   . Eczema   . Hearing deficit   . Heel spur   . Kidney stone   . Left nephrolithiasis   . Nocturia   . Obesity   . Overweight   . Plantar fasciitis   . Seasonal allergies   . UTI (lower urinary tract infection)     Past Surgical History:  Procedure Laterality Date  . TUBAL LIGATION    . TYMPANOSTOMY TUBE PLACEMENT Right     Prior to Admission medications   Medication Sig Start Date End Date Taking? Authorizing Provider  fexofenadine (ALLEGRA) 180 MG tablet Take 180 mg by mouth daily.    Provider, Historical, Sanders  meloxicam (MOBIC) 15 MG tablet Take 15 mg by mouth daily.    Provider, Historical, Sanders  nystatin (MYCOSTATIN/NYSTOP) powder  05/16/16   Provider, Historical, Sanders  ondansetron (ZOFRAN) 4 MG tablet Take 1 tablet (4 mg total) by mouth every 8 (eight) hours as needed for nausea or vomiting. 09/09/18   Brittney Sanders, Brittney Sanders  PARoxetine (Brittney Sanders) 40 MG tablet Take 1 tablet by mouth daily. 10/06/15   Provider, Historical, Sanders    Family History  Problem Relation Age of Onset  . Urolithiasis  Maternal Grandmother   . Kidney disease Neg Hx   . Bladder Cancer Neg Hx   . Kidney cancer Neg Hx      Social History   Tobacco Use  . Smoking status: Never Smoker  . Smokeless tobacco: Never Used  Substance Use Topics  . Alcohol use: No    Alcohol/week: 0.0 standard drinks  . Drug use: No    Allergies as of 12/28/2018 - Review Complete 11/03/2018  Allergen Reaction Noted  . Sulfa antibiotics Hives and Rash 03/31/2015  . Sulfamethoxazole Rash 06/29/2016    Review of Systems:    All systems reviewed and negative except where noted in HPI.   Physical Exam:  There were no vitals taken for this visit. No LMP recorded. General:   Alert,  Well-developed, well-nourished, pleasant and cooperative in NAD Head:  Normocephalic and atraumatic. Eyes:  Sclera clear, no icterus.   Conjunctiva pink. Ears:  Normal auditory acuity. Nose:  No deformity, discharge, or lesions. Mouth:  No deformity or lesions,oropharynx pink & moist. Neck:  Supple; no masses or thyromegaly. Lungs:  Respirations even and unlabored.  Clear throughout to auscultation.   No wheezes, crackles, or rhonchi. No acute distress. Heart:  Regular rate and rhythm; no murmurs, clicks, rubs, or gallops. Abdomen:  Normal bowel sounds.  No bruits.  Soft, non-tender and non-distended without masses, hepatosplenomegaly or hernias noted.  No guarding or rebound tenderness.  Negative Carnett sign.   Rectal:  Deferred.  Msk:  Symmetrical without gross deformities.  Good, equal movement & strength bilaterally. Pulses:  Normal pulses noted. Extremities:  No clubbing or edema.  No cyanosis. Neurologic:  Alert and oriented x3;  grossly normal neurologically. Skin:  Intact without significant lesions or rashes.  No jaundice. Lymph Nodes:  No significant cervical adenopathy. Psych:  Alert and cooperative. Normal mood and affect.  Imaging Studies: No results found.  Assessment and Plan:   Brittney Sanders is Sanders 33 y.o. y/o female  ***  Brittney Minium, Sanders. Clementeen Graham    Note: This dictation was prepared with Dragon dictation along with smaller phrase technology. Any transcriptional errors that result from this process are unintentional.

## 2019-03-07 ENCOUNTER — Ambulatory Visit: Payer: Medicare HMO | Admitting: Urology

## 2019-04-26 ENCOUNTER — Telehealth: Payer: Self-pay | Admitting: Urology

## 2019-04-26 DIAGNOSIS — Z87440 Personal history of urinary (tract) infections: Secondary | ICD-10-CM

## 2019-04-26 DIAGNOSIS — N200 Calculus of kidney: Secondary | ICD-10-CM

## 2019-04-26 NOTE — Addendum Note (Signed)
Addended by: Frankey Shown on: 04/26/2019 03:47 PM   Modules accepted: Orders

## 2019-04-26 NOTE — Telephone Encounter (Signed)
She will need to have a KUB, UA and urine culture.

## 2019-04-26 NOTE — Telephone Encounter (Signed)
Pt called and is having left side pain and states her urine has an odor. She denies fever or nausea. Please advise.

## 2019-04-26 NOTE — Telephone Encounter (Signed)
Please have pt added to Dr.Sninskys schedule for tomorrow she will need a KUB prior I have placed the order. Thanks

## 2019-04-26 NOTE — Telephone Encounter (Signed)
Pt has called back again and states that she is having a lot of pain and would like a call back.

## 2019-04-27 ENCOUNTER — Other Ambulatory Visit: Payer: Self-pay

## 2019-04-27 ENCOUNTER — Ambulatory Visit (INDEPENDENT_AMBULATORY_CARE_PROVIDER_SITE_OTHER): Payer: Medicare HMO | Admitting: Urology

## 2019-04-27 ENCOUNTER — Ambulatory Visit
Admission: RE | Admit: 2019-04-27 | Discharge: 2019-04-27 | Disposition: A | Discharge status: Home / Self Care | Payer: Medicare HMO | Source: Ambulatory Visit | Attending: Urology | Admitting: Urology

## 2019-04-27 ENCOUNTER — Encounter: Payer: Self-pay | Admitting: Urology

## 2019-04-27 DIAGNOSIS — N200 Calculus of kidney: Secondary | ICD-10-CM

## 2019-04-27 DIAGNOSIS — Z87440 Personal history of urinary (tract) infections: Secondary | ICD-10-CM | POA: Diagnosis not present

## 2019-04-27 LAB — URINALYSIS, COMPLETE
Bilirubin, UA: NEGATIVE
Glucose, UA: NEGATIVE
Ketones, UA: NEGATIVE
Nitrite, UA: NEGATIVE
Protein,UA: NEGATIVE
Specific Gravity, UA: 1.02 (ref 1.005–1.030)
Urobilinogen, Ur: 1 mg/dL (ref 0.2–1.0)
pH, UA: 8.5 — ABNORMAL HIGH (ref 5.0–7.5)

## 2019-04-27 LAB — MICROSCOPIC EXAMINATION

## 2019-04-27 MED ORDER — CEPHALEXIN 500 MG PO CAPS: 500.0000 mg | ORAL_CAPSULE | Freq: Four times a day (QID) | ORAL | 0 refills | Status: AC

## 2019-04-27 NOTE — Progress Notes (Signed)
   04/27/2019 3:08 PM   Brittney Sanders 1986-03-07 [id number]  Reason for visit: left flank pain  HPI: I saw Brittney Sanders in urology clinic today as an add-on for left-sided flank pain.  She is a 33 year old female with past medical history notable for morbid obesity, chronic left-sided flank pain, and nephrolithiasis.  She reports 3 to 4-day history of worsening dull left-sided flank pain that she rates moderate to severe in nature.  She denies any fevers, chills, dysuria, hematuria, or other urinary symptoms.  There are no aggravating or alleviating factors.  She underwent microscopic hematuria work-up with Dr. Lonna Cobb in 08/2018 that showed a 2 mm non-obstructive left lower pole stone, but no hydronephrosis, and cystoscopy was normal.    ROS: Please see flowsheet from today's date for complete review of systems.  Physical Exam: BP (!) 139/98   Pulse (!) 102   Ht 5\' 3"  (1.6 m)   Wt 283 lb (128.4 kg)   LMP 04/19/2019 (Exact Date)   BMI 50.13 kg/m    Constitutional:  Alert and oriented, No acute distress. Respiratory: Normal respiratory effort, no increased work of breathing. GI: Abdomen is soft, nontender, nondistended, no abdominal masses GU: Left CVA tenderness Skin: No rashes, bruises or suspicious lesions. Neurologic: Grossly intact, no focal deficits, moving all 4 extremities. Psychiatric: Normal mood and affect  Laboratory Data: Reviewed  Urinalysis today 0-10 epithelial cells, 0-2 RBCs, 11-30 WBCs, few bacteria, nitrite negative  Pertinent Imaging: I have personally reviewed the KUB today, no evidence of stone disease over the projected course of the ureters.  Assessment & Plan:   In summary, the patient is a 33 year old female with 2 to 3 days of left-sided flank pain of unclear etiology.  There are no clear ureteral stones on KUB today, in addition she does not have any microscopic hematuria on urinalysis that would be consistent with an obstructing stone.  Urine is only  mildly concerning for infection with 11-30 WBCs and few bacteria.  We discussed possible etiologies for pain at length.  If this was a small kidney stone that is not seen on KUB, it likely will pass spontaneously as CT Fall 2019 showed a 2 mm left lower pole stone.  I recommended a 5-day course of Keflex for possible UTI/left pyelonephritis, and will send urine for culture.  I counseled her extensively to seek urgent care if she developed fever over 101.3 or worsening symptoms.  Urine sent for culture, call with results 5 days treatment dose Keflex for possible UTI  A total of 10 minutes were spent face-to-face with the patient, greater than 50% was spent in patient education, counseling, and coordination of care regarding possible UTI versus nephrolithiasis.   Sondra Come, MD  [facility] [address], [address] [place], [place] [address] [phone]

## 2019-04-29 LAB — CULTURE, URINE COMPREHENSIVE

## 2019-05-09 ENCOUNTER — Ambulatory Visit: Payer: Medicare HMO | Admitting: Urology

## 2019-05-22 ENCOUNTER — Encounter: Payer: Self-pay | Admitting: *Deleted

## 2019-05-22 ENCOUNTER — Emergency Department: Payer: Medicare HMO

## 2019-05-22 ENCOUNTER — Other Ambulatory Visit: Payer: Self-pay

## 2019-05-22 ENCOUNTER — Emergency Department
Admission: EM | Admit: 2019-05-22 | Discharge: 2019-05-23 | Disposition: A | Discharge status: Home / Self Care | Payer: Medicare HMO | Attending: Emergency Medicine | Admitting: Emergency Medicine

## 2019-05-22 DIAGNOSIS — Y939 Activity, unspecified: Secondary | ICD-10-CM | POA: Diagnosis not present

## 2019-05-22 DIAGNOSIS — Z79899 Other long term (current) drug therapy: Secondary | ICD-10-CM | POA: Diagnosis not present

## 2019-05-22 DIAGNOSIS — Y929 Unspecified place or not applicable: Secondary | ICD-10-CM | POA: Diagnosis not present

## 2019-05-22 DIAGNOSIS — W07XXXA Fall from chair, initial encounter: Secondary | ICD-10-CM | POA: Diagnosis not present

## 2019-05-22 DIAGNOSIS — S060X1A Concussion with loss of consciousness of 30 minutes or less, initial encounter: Secondary | ICD-10-CM | POA: Diagnosis not present

## 2019-05-22 DIAGNOSIS — S0990XA Unspecified injury of head, initial encounter: Secondary | ICD-10-CM

## 2019-05-22 DIAGNOSIS — Y999 Unspecified external cause status: Secondary | ICD-10-CM | POA: Insufficient documentation

## 2019-05-22 MED ORDER — OXYCODONE-ACETAMINOPHEN 5-325 MG PO TABS
1.0000 | ORAL_TABLET | Freq: Once | ORAL | Status: AC
  Administered 2019-05-22: 1 via ORAL
  Filled 2019-05-22: qty 1

## 2019-05-22 NOTE — ED Provider Notes (Signed)
[facility] Emergency Department Provider Note  ____________________________________________  Time seen: Approximately 10:45 PM  I have reviewed the triage vital signs and the nursing notes.   HISTORY  Chief Complaint Head Injury    HPI Brittney Sanders is a 33 y.o. female that presents to the emergency department for evaluation of head injury.  Patient states that she was sitting in a folding chair when she tipped backwards hitting her head.  She states that she did lose consciousness but is not sure for how long.  Incident happened 6 hours ago.  She has had a consistent posterior headache since fall.  Her neck is just a little sore. No additional injuries.  She does not take any blood thinners.  No dizziness, confusion, vomiting.  Past Medical History:  Diagnosis Date  . Depression   . Eczema   . Hearing deficit   . Heel spur   . Kidney stone   . Left nephrolithiasis   . Nocturia   . Obesity   . Overweight   . Plantar fasciitis   . Seasonal allergies   . UTI (lower urinary tract infection)     Patient Active Problem List   Diagnosis Date Noted  . 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 Auditory disturbance 12/30/2015  . Adiposity 12/30/2015  . Renal stone 12/17/2015  . Pyelonephritis 12/17/2015  . 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 Calculus of kidney 08/16/2014  . Allergic rhinitis 04/03/2014  . Stress fracture of calcaneus 03/14/2014  . Calcaneal spur 02/23/2014  . Plantar fasciitis 02/23/2014    Past Surgical History:  Procedure Laterality Date  . TUBAL LIGATION    . TYMPANOSTOMY TUBE PLACEMENT Right     Prior to Admission medications   Medication Sig Start Date End Date Taking? Authorizing Provider  fexofenadine (ALLEGRA) 180 MG tablet Take 180 mg by mouth daily.    [provider]  meloxicam (MOBIC) 15 MG tablet Take 15 mg by mouth daily.    [provider]  naproxen (NAPROSYN) 500 MG tablet Take 1 tablet (500 mg  total) by mouth 2 (two) times daily with a meal. 05/23/19 05/22/20  Enid DerryWagner, Bradford Cazier, PA-C  nystatin (MYCOSTATIN/NYSTOP) powder  05/16/16   [provider]  ondansetron (ZOFRAN) 4 MG tablet Take 1 tablet (4 mg total) by mouth every 8 (eight) hours as needed for nausea or vomiting. 09/09/18   Jeanmarie PlantMcShane, James A, MD  PARoxetine (PAXIL) 40 MG tablet Take 40 mg by mouth 2 (two) times a day.    [provider]    Allergies Sulfa antibiotics and Sulfamethoxazole  Family History  Problem Relation Age of Onset  . Urolithiasis Maternal Grandmother   . Kidney disease Neg Hx   . Bladder Cancer Neg Hx   . Kidney cancer Neg Hx     Social History Social History   Tobacco Use  . Smoking status: Never Smoker  . Smokeless tobacco: Never Used  Substance Use Topics  . Alcohol use: No    Alcohol/week: 0.0 standard drinks  . Drug use: No     Review of Systems  Cardiovascular: No chest pain. Respiratory: No cough. No SOB. Gastrointestinal: No abdominal pain.  No nausea, no vomiting.  Musculoskeletal: Positive for neck soreness.  Skin: Negative for rash, abrasions, lacerations, ecchymosis. Neurological: Negative for numbness or tingling   ____________________________________________   PHYSICAL EXAM:  VITAL SIGNS: ED Triage Vitals  Enc Vitals Group     BP 05/22/19 2210 132/71     Pulse Rate  05/22/19 2210 93     Resp 05/22/19 2210 20     Temp 05/22/19 2210 99.3 F (37.4 C)     Temp Source 05/22/19 2210 Oral     SpO2 05/22/19 2210 100 %     Weight 05/22/19 2211 280 lb (127 kg)     Height 05/22/19 2211 5\' 3"  (1.6 m)     Head Circumference --      Peak Flow --      Pain Score 05/22/19 2221 6     Pain Loc --      Pain Edu? --      Excl. in [place]? --      Constitutional: Alert and oriented. Well appearing and in no acute distress. Eyes: Conjunctivae are normal. PERRL. EOMI. Head: Atraumatic.  ENT: No battle signs.       Ears: Tympanic membranes are pearly.      Nose:  No congestion/rhinnorhea.      Mouth/Throat: Mucous membranes are moist.  Neck: No stridor.  No cervical spine tenderness to palpation. Cardiovascular: Normal rate, regular rhythm.  Good peripheral circulation. Respiratory: Normal respiratory effort without tachypnea or retractions. Lungs CTAB. Good air entry to the bases with no decreased or absent breath sounds. Gastrointestinal: Bowel sounds 4 quadrants. Soft and nontender to palpation. No guarding or rigidity. No palpable masses. No distention. Musculoskeletal: Full range of motion to all extremities. No gross deformities appreciated. Neurologic:  Normal speech and language. No gross focal neurologic deficits are appreciated.  Skin:  Skin is warm, dry and intact. No rash noted. Psychiatric: Mood and affect are normal. Speech and behavior are normal. Patient exhibits appropriate insight and judgement.   ____________________________________________   LABS (all labs ordered are listed, but only abnormal results are displayed)  Labs Reviewed - No data to display ____________________________________________  EKG   ____________________________________________  RADIOLOGY Robinette Haines, personally viewed and evaluated these images (plain radiographs) as part of my medical decision making, as well as reviewing the written report by the radiologist.  Dg Cervical Spine 2-3 Views  Result Date: 05/22/2019 CLINICAL DATA:  33 year old female with neck pain. EXAM: CERVICAL SPINE - 2-3 VIEW COMPARISON:  None. FINDINGS: There is no evidence of cervical spine fracture or prevertebral soft tissue swelling. Alignment is normal. No other significant bone abnormalities are identified. IMPRESSION: Negative cervical spine radiographs. Electronically Signed   By: Anner Crete M.D.   On: 05/22/2019 23:43   Ct Head Wo Contrast  Result Date: 05/22/2019 CLINICAL DATA:  Head trauma and headache EXAM: CT HEAD WITHOUT CONTRAST TECHNIQUE: Contiguous  axial images were obtained from the base of the skull through the vertex without intravenous contrast. COMPARISON:  None. FINDINGS: Brain: There is no mass, hemorrhage or extra-axial collection. There are scattered parenchymal calcifications. The size and configuration of the ventricles and extra-axial CSF spaces are normal. The brain parenchyma is normal, without acute or chronic infarction. Vascular: No abnormal hyperdensity of the major intracranial arteries or dural venous sinuses. No intracranial atherosclerosis. Skull: The visualized skull base, calvarium and extracranial soft tissues are normal. Sinuses/Orbits: No fluid levels or advanced mucosal thickening of the visualized paranasal sinuses. No mastoid or middle ear effusion. The orbits are normal. IMPRESSION: 1. No acute intracranial abnormality. 2. Scattered parenchymal calcifications may be a sequela of remote infection. Electronically Signed   By: Ulyses Jarred M.D.   On: 05/22/2019 23:59    ____________________________________________    PROCEDURES  Procedure(s) performed:    Procedures    Medications  naproxen (NAPROSYN) tablet 500 mg (has no administration in time range)  oxyCODONE-acetaminophen (PERCOCET/ROXICET) 5-325 MG per tablet 1 tablet (1 tablet Oral Given 05/22/19 2341)     ____________________________________________   INITIAL IMPRESSION / ASSESSMENT AND PLAN / ED COURSE  Pertinent labs & imaging results that were available during my care of the patient were reviewed by me and considered in my medical decision making (see chart for details).  Review of the [place] CSRS was performed in accordance of the [facility] prior to dispensing any controlled drugs.   Patient's diagnosis is consistent with concussion.  Head CT negative for acute abnormalities.  Cervical x-ray negative for acute bony abnormalities.  Headache improved with Percocet.  Patient will be discharged home with prescriptions for naproxyn. Patient is to follow  up with primary care as directed. Patient is given ED precautions to return to the ED for any worsening or new symptoms.     ____________________________________________  FINAL CLINICAL IMPRESSION(S) / ED DIAGNOSES  Final diagnoses:  Concussion with loss of consciousness of 30 minutes or less, initial encounter  Injury of head, initial encounter      NEW MEDICATIONS STARTED DURING THIS VISIT:  ED Discharge Orders         Ordered    naproxen (NAPROSYN) 500 MG tablet  2 times daily with meals     05/23/19 0006              This chart was dictated using voice recognition software/Dragon. Despite best efforts to proofread, errors can occur which can change the meaning. Any change was purely unintentional.    Enid DerryWagner, Kandice Schmelter, PA-C 05/23/19 0007    Sharman CheekStafford, Phillip, MD 05/24/19 [phone]

## 2019-05-22 NOTE — ED Triage Notes (Signed)
Pt was sitting in a chair outside today and states the wind blew her back and she struck head on the ground.  No loc. No vomiting.  Pt has a headache.  No otc meds for h/a  Pt alert  Speech clear.

## 2019-05-22 NOTE — ED Notes (Signed)
No ct scans at this time per dr Jimmye Norman.

## 2019-05-22 NOTE — Discharge Instructions (Addendum)
Your head CT and x-rays are reassuring.  Your symptoms are consistent with a concussion.  Please call your primary care provider for an appointment this week.  You can take naproxen for headaches.

## 2019-05-23 MED ORDER — NAPROXEN 500 MG PO TABS
500.0000 mg | ORAL_TABLET | Freq: Once | ORAL | Status: AC
  Administered 2019-05-23: 500 mg via ORAL
  Filled 2019-05-23: qty 1

## 2019-05-23 MED ORDER — NAPROXEN 500 MG PO TABS: 500.0000 mg | ORAL_TABLET | Freq: Two times a day (BID) | ORAL | 0 refills | Status: DC

## 2019-06-07 NOTE — Progress Notes (Signed)
06/08/2019 9:29 AM   Brittney Sanders 05/09/1986 [id number]  Referring provider: Ellamae Sia, MD [address] [place],  [place] [address]  Chief Complaint  Patient presents with  . Nephrolithiasis    HPI: [name] is a 33 year old female with a history of hematuria, history of vesicular reflux and left renal stone who presents today for a follow up.  History of hematuria (high risk) Non-smoker.  CTU in 08/2018 noted the adrenal glands appear normal.  Left renal cortical scarring identified. Stone within the inferior pole collecting system of the left kidney measures 2-3 mm, image 36/2. No kidney mass or hydronephrosis identified bilaterally. The ureters are patent bilaterally. Urinary bladder appears normal. No bladder calculi.   Cystoscopy with Dr. Bernardo Heater in 10/2018 was NED.  She does not report any gross hematuria.  Her UA today is negative.    History of vesicular reflux VCUG in 12/2015 revealed Vesicoureteral reflux grade 1 -2.  She saw Dr. Louis Meckel in 2017 and no treatment was recommended. RUS in 08/2017 revealed no hydronephrosis.  6 mm left renal calculus.  Since her last visit, she had a CT hematuria workup on 09/29/2018 that showed: Small nonobstructing calculus is identified within the inferior pole of the left kidney. Extensive left kidney scarring with diffuse cortical lobulation  Left renal stone See CTU report.    She has no urinary complaints at this time.  Patient denies any gross hematuria, dysuria or suprapubic/flank pain.  Patient denies any fevers, chills, nausea or vomiting.    PMH: Past Medical History:  Diagnosis Date  . Depression   . Eczema   . Hearing deficit   . Heel spur   . Kidney stone   . Left nephrolithiasis   . Nocturia   . Obesity   . Overweight   . Plantar fasciitis   . Seasonal allergies   . UTI (lower urinary tract infection)     Surgical History: Past Surgical History:  Procedure Laterality Date  . TUBAL LIGATION     . TYMPANOSTOMY TUBE PLACEMENT Right     Home Medications:  Allergies as of 06/08/2019      Reactions   Sulfa Antibiotics Hives, Rash   Sulfamethoxazole Rash      Medication List       Accurate as of June 08, 2019  9:29 AM. If you have any questions, ask your nurse or doctor.        fexofenadine 180 MG tablet Commonly known as: ALLEGRA Take 180 mg by mouth daily.   meloxicam 15 MG tablet Commonly known as: MOBIC Take 15 mg by mouth daily.   naproxen 500 MG tablet Commonly known as: Naprosyn Take 1 tablet (500 mg total) by mouth 2 (two) times daily with a meal.   nystatin powder Commonly known as: MYCOSTATIN/NYSTOP   ondansetron 4 MG tablet Commonly known as: Zofran Take 1 tablet (4 mg total) by mouth every 8 (eight) hours as needed for nausea or vomiting.   PARoxetine 40 MG tablet Commonly known as: PAXIL Take 40 mg by mouth 2 (two) times a day.       Allergies:  Allergies  Allergen Reactions  . Sulfa Antibiotics Hives and Rash  . Sulfamethoxazole Rash    Family History: Family History  Problem Relation Age of Onset  . Urolithiasis Maternal Grandmother   . Kidney disease Neg Hx   . Bladder Cancer Neg Hx   . Kidney cancer Neg Hx  Social History:  reports that she has never smoked. She has never used smokeless tobacco. She reports that she does not drink alcohol or use drugs.  ROS: UROLOGY Frequent Urination?: No Hard to postpone urination?: No Burning/pain with urination?: No Get up at night to urinate?: No Leakage of urine?: No Urine stream starts and stops?: No Trouble starting stream?: No Do you have to strain to urinate?: No Blood in urine?: No Urinary tract infection?: No Sexually transmitted disease?: No Injury to kidneys or bladder?: No Painful intercourse?: No Weak stream?: No Currently pregnant?: No Vaginal bleeding?: No Last menstrual period?: n  Gastrointestinal Nausea?: No Vomiting?: No Indigestion/heartburn?: No  Diarrhea?: No Constipation?: No  Constitutional Fever: No Night sweats?: No Weight loss?: No Fatigue?: No  Skin Skin rash/lesions?: No Itching?: No  Eyes Blurred vision?: No Double vision?: No  Ears/Nose/Throat Sore throat?: No Sinus problems?: No  Hematologic/Lymphatic Swollen glands?: No Easy bruising?: No  Cardiovascular Leg swelling?: No Chest pain?: No  Respiratory Cough?: No Shortness of breath?: No  Endocrine Excessive thirst?: No  Musculoskeletal Back pain?: No Joint pain?: No  Neurological Headaches?: No Dizziness?: No  Psychologic Depression?: No Anxiety?: No  Physical Exam: BP 121/77 (BP Location: Left Arm, Patient Position: Sitting, Cuff Size: Large)   Pulse 85   Ht 5\' 3"  (1.6 m)   Wt 290 lb (131.5 kg)   LMP 05/22/2019   BMI 51.37 kg/m   Constitutional:  Well nourished. Alert and oriented, No acute distress. HEENT: [place] AT, moist mucus membranes.  Trachea midline, no masses. Cardiovascular: No clubbing, cyanosis, or edema. Respiratory: Normal respiratory effort, no increased work of breathing. Neurologic: Grossly intact, no focal deficits, moving all 4 extremities. Psychiatric: Normal mood and affect.   Laboratory Data: Lab Results  Component Value Date   WBC 10.3 09/09/2018   HGB 14.3 09/09/2018   HCT 42.3 09/09/2018   MCV 84.9 09/09/2018   PLT 399 09/09/2018    Lab Results  Component Value Date   CREATININE 0.82 09/09/2018    No results found for: PSA  No results found for: TESTOSTERONE  No results found for: HGBA1C  No results found for: TSH  No results found for: CHOL, HDL, CHOLHDL, VLDL, LDLCALC  Lab Results  Component Value Date   AST 24 09/09/2018   Lab Results  Component Value Date   ALT 22 09/09/2018   No components found for: ALKALINEPHOPHATASE No components found for: BILIRUBINTOTAL  No results found for: ESTRADIOL  Urinalysis Negative.  See Epic. I have reviewed the labs.  Assessment &  Plan:    1. History of hematuria Hematuria work up completed in 10/2018- findings positive for 2 mm left renal stone No report of gross hematuria  UA today is negative  RTC in one year for UA - patient to report any gross hematuria in the interim    2. History of vesicular reflux No intervention at this time  3. Left renal stone No intervention at this time  Return in about 1 year (around 06/07/2020) for UA and recheck .  These notes generated with voice recognition software. I apologize for typographical errors.  Michiel CowboySHANNON Earnie Bechard, PA-C  [facility] [address]  [address] [place], [place] [address] [phone]

## 2019-06-08 ENCOUNTER — Encounter: Payer: Self-pay | Admitting: Urology

## 2019-06-08 ENCOUNTER — Ambulatory Visit (INDEPENDENT_AMBULATORY_CARE_PROVIDER_SITE_OTHER): Payer: Medicare HMO | Admitting: Urology

## 2019-06-08 ENCOUNTER — Other Ambulatory Visit: Payer: Self-pay

## 2019-06-08 VITALS — BP 121/77 | HR 85 | Ht 63.0 in | Wt 290.0 lb

## 2019-06-08 DIAGNOSIS — Z87448 Personal history of other diseases of urinary system: Secondary | ICD-10-CM

## 2019-06-08 DIAGNOSIS — N200 Calculus of kidney: Secondary | ICD-10-CM

## 2019-06-09 LAB — URINALYSIS, COMPLETE
Bilirubin, UA: NEGATIVE
Glucose, UA: NEGATIVE
Ketones, UA: NEGATIVE
Nitrite, UA: NEGATIVE
Protein,UA: NEGATIVE
RBC, UA: NEGATIVE
Specific Gravity, UA: 1.02 (ref 1.005–1.030)
Urobilinogen, Ur: 0.2 mg/dL (ref 0.2–1.0)
pH, UA: 8 — ABNORMAL HIGH (ref 5.0–7.5)

## 2019-06-09 LAB — MICROSCOPIC EXAMINATION
Epithelial Cells (non renal): >10 /hpf — AB (ref 0–10)
RBC: NONE SEEN /hpf (ref 0–2)

## 2019-06-26 ENCOUNTER — Ambulatory Visit
Admit: 2019-06-26 | Discharge: 2019-06-27 | Payer: MEDICARE | Attending: Student in an Organized Health Care Education/Training Program | Primary: Student in an Organized Health Care Education/Training Program

## 2019-06-26 DIAGNOSIS — L309 Dermatitis, unspecified: Secondary | ICD-10-CM

## 2019-06-26 DIAGNOSIS — L209 Atopic dermatitis, unspecified: Principal | ICD-10-CM

## 2019-06-26 MED ORDER — CLOBETASOL 0.05 % TOPICAL CREAM
5 refills | 0 days | Status: CP
Start: 2019-06-26 — End: ?

## 2019-06-26 MED ORDER — CLOBETASOL 0.05 % TOPICAL OINTMENT
5 refills | 0 days | Status: CP
Start: 2019-06-26 — End: ?

## 2019-06-26 MED ORDER — DUPILUMAB 300 MG/2 ML SUBCUTANEOUS SYRINGE
SUBCUTANEOUS | 1 refills | 98 days | Status: CP
Start: 2019-06-26 — End: 2019-12-25
  Filled 2019-07-03: qty 4, 14d supply, fill #0

## 2019-06-27 NOTE — Unmapped (Signed)
Per test claim for Dupixent at the [facility], patient needs Medication Assistance Program for Prior Authorization.

## 2019-06-28 NOTE — Unmapped (Signed)
[facility] Medication Referral: PA Approved      Medication (Brand/Generic): DUPIXENT    Final Test Claim completed with resulted information below:    Patient ABLE to fill at [facility]  Insurance Company:  [facility] MEDICARE PART D  Anticipated Copay: $8.95  Is anticipated copay with a copay card or grant? No, there is no need for grant or copay assistance.     Does this patient have to receive a partial fill of the medication due to insurance restrictions? NO  If so, please cofirm how many days supply is allowed per plan per fill and how long the patient will have to fill partial months supply for the medication: NOT APPLICABLE     If the copay is under the $25 defined limit, per policy there will be no further investigation of need for financial assistance at this time unless patient requests. This referral has been communicated to the provider and handed off to the [facility] team for further processing and filling of prescribed medication.   ______________________________________________________________________  Please utilize this referral for viewing purposes as it will serve as the central location for all relevant documentation and updates.

## 2019-06-30 MED ORDER — EMPTY CONTAINER
2 refills | 0 days
Start: 2019-06-30 — End: ?

## 2019-06-30 NOTE — Unmapped (Signed)
[facility]   Patient Onboarding/Medication Counseling    Samantha Miles is a 33 y.o. female with atopic dermatiis who I am counseling today on initiation of therapy.  I am speaking to the patient.    Verified patient's date of birth / HIPAA.    Specialty medication(s) to be sent: Inflammatory Disorders: Dupixent      Non-specialty medications/supplies to be sent: sharps kit      Medications not needed at this time: na         Dupixent (dupilumab)    Medication & Administration     Dosage: Atopic dermatitis: Inject 600mg  under the skin as a loading dose followed by 300mg  every 14 days thereafter    Administration:     Prefilled syringe  1. Gather all supplies needed for injection on a clean, flat working surface: medication syringe removed from packaging, alcohol swab, sharps container, etc.  2. Look at the medication label ??? look for correct medication, correct dose, and check the expiration date  3. Look at the medication ??? the liquid in the syringe should appear clear and colorless to pale yellow  4. Lay the syringe on a flat surface and allow it to warm up to room temperature for at least 45 minutes  5. Select injection site ??? you can use the front of your thigh or your belly (but not the area 2 inches around your belly button); if someone else is giving you the injection you can also use your upper arm in the skin covering your triceps muscle  6. Prepare injection site ??? wash your hands and clean the skin at the injection site with an alcohol swab and let it air dry, do not touch the injection site again before the injection  7. Pull off the needle safety cap, do not remove until immediately prior to injection  8. Pinch the skin ??? with your hand not holding the syringe pinch up a fold of skin at the injection site using your forefinger and thumb  9. Insert the needle into the fold of skin at about a 45 degree angle ??? it's best to use a quick dart-like motion ??? with the syringe in position, release the pinch of skin and allow the skin to relax  10. Push the plunger down slowly as far as it will go until the syringe is empty, if the plunger is not fully depressed the needle shield will not extend to cover the needle when it is removed  11. Check that the syringe is empty and keep pressing down on the plunger while you pull the needle out at the same angle as inserted; after the needle is removed completely from the skin, release the plunger allowing the needle shield to activate and cover the used needle  12. Dispose of the used syringe immediately in your sharps disposal container  13. If you see any blood at the injection site, press a cotton ball or gauze on the site and maintain pressure until the bleeding stops, do not rub the injection site      Adherence/Missed dose instructions:  If a dose is missed, administer within 7 days from the missed dose and then resume the original schedule. If the missed dose is not administered within 7 days, you can either wait until the next dose on the original schedule or take your dose now and resume every 14 days from the new injection date. Do not use 2 doses at the same time or extra  doses.      Goals of Therapy       Atopic Dermatitis  ? Reduce symptoms of pruritus and dermatitis  ? Prevent exacerbations  ? Minimize therapeutic risks  ? Avoidance of long-term systemic and topical glucocorticoid use  ? Maintenance of effective psychosocial functioning      Side Effects & Monitoring Parameters     ? Injection site reaction (redness, irritation, inflammation localized to the site of administration)  ? Signs of a common cold ??? minor sore throat, runny or stuffy nose, etc.  ? Recurrence of cold sores (herpes simplex)      The following side effects should be reported to the provider:  ? Signs of a hypersensitivity reaction ??? rash; hives; itching; red, swollen, blistered, or peeling skin; wheezing; tightness in the chest or throat; difficulty breathing, swallowing, or talking; swelling of the mouth, face, lips, tongue, or throat; etc.  ? Eye pain or irritation or any visual disturbances  ? Shortness of breath or worsening of breathing      Contraindications, Warnings, & Precautions     ? Have your bloodwork checked as you have been told by your prescriber   ? Birth control pills and other hormone-based birth control may not work as well to prevent pregnancy  ? Talk with your doctor if you are pregnant, planning to become pregnant, or breastfeeding  ? Discuss the possible need for holding your dose(s) of Dupixent?? when a planned procedure is scheduled with the prescriber as it may delay healing/recovery timeline       Drug/Food Interactions     ? Medication list reviewed in Epic. The patient was instructed to inform the care team before taking any new medications or supplements. No drug interactions identified.   ? Talk with you prescriber or pharmacist before receiving any live vaccinations while taking this medication and after you stop taking it    Storage, Handling Precautions, & Disposal     ? Store this medication in the refrigerator.  Do not freeze  ? If needed, you may store at room temperature for up to 14 days  ? Store in [employer], protected from light  ? Do not shake  ? Dispose of used syringes in a sharps disposal container              Current Medications (including OTC/herbals), Comorbidities and Allergies     Current Outpatient Medications   Medication Sig Dispense Refill   ??? betamethasone dipropionate (DIPROLENE) 0.05 % ointment betamethasone dipropionate 0.05 % topical ointment     ??? clobetasoL (TEMOVATE) 0.05 % cream Apply topically to rash on hands in the morning until resolved. 60 g 5   ??? clobetasoL (TEMOVATE) 0.05 % ointment Apply to eczema on hands at night until resolved. 60 g 5   ??? dupilumab (DUPIXENT) 300 mg/2 mL Syrg injection Inject the contents of 2 syringes (600 mg) under the skin on day 1. Then inject the contents of 1 syringe (300 mg) on day 15 and every fourteen (14) days thereafter. 14 mL 1   ??? empty container Misc Use as directed to dispose of Dupixent syringes. 1 each 2   ??? fexofenadine (ALLEGRA) 180 MG tablet Take 180 mg by mouth daily.  11   ??? hydrocortisone 2.5 % cream Apply 1 application topically Two (2) times a day. To rash on eyelids until resolved. 30 g 2   ??? ibuprofen (ADVIL,MOTRIN) 800 MG tablet TAKE 1 TABLET BY MOUTH EVERY 8 HOURS AS  NEEDED FOR MODERATE PAIN  0   ??? meloxicam (MOBIC) 15 MG tablet Mobic 15 mg tablet   Take 1 tablet(s) every day by oral route.     ??? nystatin (MYCOSTATIN) powder      ??? ondansetron (ZOFRAN) 4 MG tablet Take 4 mg by mouth.     ??? PARoxetine (PAXIL) 40 MG tablet        No current facility-administered medications for this visit.        Allergies   Allergen Reactions   ??? Sulfacetamide Sodium Hives and Rash   ??? Sulfamethoxazole Rash       There is no problem list on file for this patient.      Reviewed and up to date in Epic.    Appropriateness of Therapy     Is medication and dose appropriate based on diagnosis? Yes    Baseline Quality of Life Assessment      How many days over the past month did your atopic dermatits keep you from your normal activities? 0    Financial Information     Medication Assistance provided: Prior Authorization    Anticipated copay of $8.95 reviewed with patient. Verified delivery address.    Delivery Information     Scheduled delivery date: Monday, AUG 3 (loading dose only)    Expected start date: Monday, Aug 3    Medication will be delivered via Same Day Courier to the home address in [place].  This shipment will not require a signature.      Explained the services we provide at [facility] and that each month we would call to set up refills.  Stressed importance of returning phone calls so that we could ensure they receive their medications in time each month.  Informed patient that we should be setting up refills 7-10 days prior to when they will run out of medication.  A pharmacist will reach out to perform a clinical assessment periodically.  Informed patient that a welcome packet and a drug information handout will be sent.      Patient verbalized understanding of the above information as well as how to contact the pharmacy at [phone] option [phone] with any questions/concerns.  The pharmacy is open Monday through Friday 8:30am-4:30pm.  A pharmacist is available 24/7 via pager to answer any clinical questions they may have.    Patient Specific Needs     ? Does the patient have any physical, cognitive, or cultural barriers? No    ? Patient prefers to have medications discussed with  Patient     ? Is the patient able to read and understand education materials at a high school level or above? No    ? Patient's primary language is  English     ? Is the patient high risk? No     ? Does the patient require a Care Management Plan? No     ? Does the patient require physician intervention or other additional services (i.e. nutrition, smoking cessation, social work)? No      Patryck Kilgore A Desiree Lucy [facility] Specialty Pharmacist

## 2019-07-03 MED FILL — DUPIXENT 300 MG/2 ML SUBCUTANEOUS SYRINGE: 14 days supply | Qty: 4 | Fill #0 | Status: AC

## 2019-07-03 MED FILL — EMPTY CONTAINER: 120 days supply | Qty: 1 | Fill #0

## 2019-07-03 MED FILL — EMPTY CONTAINER: 120 days supply | Qty: 1 | Fill #0 | Status: AC

## 2019-07-12 NOTE — Unmapped (Signed)
Samantha Miles reports her first Dupixent injections went well, and she had no adverse effects. We reviewed her next injection will be Monday, AUG 17.    [facility] Clinical Assessment & Refill Coordination Note    Samantha Miles, DOB: Apr 07, 1986  Phone: [phone] (home)     All above HIPAA information was verified with patient.     Specialty Medication(s):   Inflammatory Disorders: Dupixent     Current Outpatient Medications   Medication Sig Dispense Refill   ??? betamethasone dipropionate (DIPROLENE) 0.05 % ointment betamethasone dipropionate 0.05 % topical ointment     ??? clobetasoL (TEMOVATE) 0.05 % cream Apply topically to rash on hands in the morning until resolved. 60 g 5   ??? clobetasoL (TEMOVATE) 0.05 % ointment Apply to eczema on hands at night until resolved. 60 g 5   ??? dupilumab (DUPIXENT) 300 mg/2 mL Syrg injection Inject the contents of 2 syringes (600 mg) under the skin on day 1. Then inject the contents of 1 syringe (300 mg) on day 15 and every fourteen (14) days thereafter. 14 mL 1   ??? empty container Misc Use as directed to dispose of Dupixent syringes. 1 each 2   ??? fexofenadine (ALLEGRA) 180 MG tablet Take 180 mg by mouth daily.  11   ??? hydrocortisone 2.5 % cream Apply 1 application topically Two (2) times a day. To rash on eyelids until resolved. 30 g 2   ??? ibuprofen (ADVIL,MOTRIN) 800 MG tablet TAKE 1 TABLET BY MOUTH EVERY 8 HOURS AS NEEDED FOR MODERATE PAIN  0   ??? meloxicam (MOBIC) 15 MG tablet Mobic 15 mg tablet   Take 1 tablet(s) every day by oral route.     ??? nystatin (MYCOSTATIN) powder      ??? ondansetron (ZOFRAN) 4 MG tablet Take 4 mg by mouth.     ??? PARoxetine (PAXIL) 40 MG tablet        No current facility-administered medications for this visit.         Changes to medications: Samantha Miles reports no changes at this time.    Allergies   Allergen Reactions   ??? Sulfacetamide Sodium Hives and Rash   ??? Sulfamethoxazole Rash       Changes to allergies: No    SPECIALTY MEDICATION ADHERENCE     Dupixent - 0 left       Specialty medication(s) dose(s) confirmed: Regimen is correct and unchanged.     Are there any concerns with adherence? No    Adherence counseling provided? Not needed    CLINICAL MANAGEMENT AND INTERVENTION      Clinical Benefit Assessment:    Do you feel the medicine is effective or helping your condition? not yet - too soon to tell    Clinical Benefit counseling provided? Not needed    Adverse Effects Assessment:    Are you experiencing any side effects? No    Are you experiencing difficulty administering your medicine? No    Quality of Life Assessment:    How many days over the past month did your atopic dermatitis  keep you from your normal activities? For example, brushing your teeth or getting up in the morning. Patient declined to answer    Have you discussed this with your provider? Not needed    Therapy Appropriateness:    Is therapy appropriate? Yes, therapy is appropriate and should be continued    DISEASE/MEDICATION-SPECIFIC INFORMATION      For patients on injectable medications:  Patient currently has 0 doses left.  Next injection is scheduled for Monday, AUG 17.    PATIENT SPECIFIC NEEDS     ? Does the patient have any physical, cognitive, or cultural barriers? No    ? Is the patient high risk? No     ? Does the patient require a Care Management Plan? No     ? Does the patient require physician intervention or other additional services (i.e. nutrition, smoking cessation, social work)? No      SHIPPING     Specialty Medication(s) to be Shipped:   Inflammatory Disorders: Dupixent    Other medication(s) to be shipped: NA     Changes to insurance: No    Delivery Scheduled: Yes, Expected medication delivery date: Friday, AUG 14.     Medication will be delivered via Same Day Courier to the confirmed home address in [facility].    The patient will receive a drug information handout for each medication shipped and additional FDA Medication Guides as required. Verified that patient has previously received a Conservation officer, historic buildings.    All of the patient's questions and concerns have been addressed.    Lanney Gins   [facility] Specialty Pharmacist

## 2019-07-14 MED FILL — DUPIXENT 300 MG/2 ML SUBCUTANEOUS SYRINGE: 28 days supply | Qty: 4 | Fill #1 | Status: AC

## 2019-07-14 MED FILL — DUPIXENT 300 MG/2 ML SUBCUTANEOUS SYRINGE: SUBCUTANEOUS | 28 days supply | Qty: 4 | Fill #1

## 2019-07-24 ENCOUNTER — Other Ambulatory Visit: Payer: Self-pay

## 2019-07-24 DIAGNOSIS — Z20822 Contact with and (suspected) exposure to covid-19: Secondary | ICD-10-CM

## 2019-07-26 LAB — NOVEL CORONAVIRUS, NAA: SARS-CoV-2, NAA: NOT DETECTED

## 2019-08-03 NOTE — Unmapped (Signed)
[facility] Refill Coordination Note    Specialty Medication(s) to be Shipped:   Inflammatory Disorders: Dupixent    Other medication(s) to be shipped: na     Archer Tenna Delaine, DOB: May 02, 1986  Phone: [phone] (home)       All above HIPAA information was verified with patient.     Completed refill call assessment today to schedule patient's medication shipment from the [facility] [phone]).       Specialty medication(s) and dose(s) confirmed: Regimen is correct and unchanged.   Changes to medications: Deasiah reports no changes at this time.  Changes to insurance: No  Questions for the pharmacist: No    Confirmed patient received Welcome Packet with first shipment. The patient will receive a drug information handout for each medication shipped and additional FDA Medication Guides as required.       DISEASE/MEDICATION-SPECIFIC INFORMATION        For patients on injectable medications: Patient currently has 0 doses left.  Next injection is scheduled for [id number].    SPECIALTY MEDICATION ADHERENCE     Medication Adherence    Patient reported X missed doses in the last month: 0  Specialty Medication: Dupixent 300 mg/2 ml  Patient is on additional specialty medications: No  Patient is on more than two specialty medications: No  Any gaps in refill history greater than 2 weeks in the last 3 months: no  Demonstrates understanding of importance of adherence: yes  Informant: patient  Reliability of informant: reliable  Confirmed plan for next specialty medication refill: delivery by pharmacy  Refills needed for supportive medications: not needed                Dupixent 300 mg/2 ml. 0 on hand      SHIPPING     Shipping address confirmed in Epic.     Delivery Scheduled: Yes, Expected medication delivery date: 091120.     Medication will be delivered via Same Day Courier to the home address in Epic [place].    Harout Scheurich D Katlyne Nishida   [facility] Specialty Technician

## 2019-08-11 ENCOUNTER — Other Ambulatory Visit: Payer: Self-pay | Admitting: Neurology

## 2019-08-11 ENCOUNTER — Other Ambulatory Visit (HOSPITAL_COMMUNITY): Payer: Self-pay | Admitting: Neurology

## 2019-08-11 DIAGNOSIS — R9089 Other abnormal findings on diagnostic imaging of central nervous system: Secondary | ICD-10-CM

## 2019-08-11 DIAGNOSIS — H9193 Unspecified hearing loss, bilateral: Secondary | ICD-10-CM

## 2019-08-11 MED FILL — DUPIXENT 300 MG/2 ML SUBCUTANEOUS SYRINGE: SUBCUTANEOUS | 28 days supply | Qty: 4 | Fill #2

## 2019-08-11 MED FILL — DUPIXENT 300 MG/2 ML SUBCUTANEOUS SYRINGE: 28 days supply | Qty: 4 | Fill #2 | Status: AC

## 2019-08-23 ENCOUNTER — Other Ambulatory Visit: Payer: Self-pay

## 2019-08-23 ENCOUNTER — Ambulatory Visit
Admission: RE | Admit: 2019-08-23 | Discharge: 2019-08-23 | Disposition: A | Discharge status: Home / Self Care | Payer: Medicare HMO | Source: Ambulatory Visit | Attending: Neurology | Admitting: Neurology

## 2019-08-23 DIAGNOSIS — H9193 Unspecified hearing loss, bilateral: Secondary | ICD-10-CM | POA: Diagnosis present

## 2019-08-23 DIAGNOSIS — R9089 Other abnormal findings on diagnostic imaging of central nervous system: Secondary | ICD-10-CM | POA: Diagnosis not present

## 2019-08-23 MED ORDER — GADOBUTROL 1 MMOL/ML IV SOLN
10.0000 mL | Freq: Once | INTRAVENOUS | Status: AC | PRN
  Administered 2019-08-23: 10 mL via INTRAVENOUS

## 2019-09-01 NOTE — Unmapped (Signed)
[facility] Refill Coordination Note    Specialty Medication(s) to be Shipped:   Inflammatory Disorders: Dupixent    Other medication(s) to be shipped: na     Samantha Miles, DOB: January 31, 1986  Phone: [phone] (home)       All above HIPAA information was verified with patient.     Completed refill call assessment today to schedule patient's medication shipment from the [facility] [phone]).       Specialty medication(s) and dose(s) confirmed: Regimen is correct and unchanged.   Changes to medications: [name] reports no changes at this time.  Changes to insurance: No  Questions for the pharmacist: No    Confirmed patient received Welcome Packet with first shipment. The patient will receive a drug information handout for each medication shipped and additional FDA Medication Guides as required.       DISEASE/MEDICATION-SPECIFIC INFORMATION        For patients on injectable medications: Patient currently has 0 doses left.  Next injection is scheduled for 101420.    SPECIALTY MEDICATION ADHERENCE     Medication Adherence    Patient reported X missed doses in the last month: 0  Specialty Medication: Dupixent 300 mg/2 ml  Patient is on additional specialty medications: No  Any gaps in refill history greater than 2 weeks in the last 3 months: no  Demonstrates understanding of importance of adherence: yes  Informant: patient  Reliability of informant: reliable  Confirmed plan for next specialty medication refill: delivery by pharmacy  Refills needed for supportive medications: not needed                Dupixent 300 mg/2 ml. 0 on hand2      SHIPPING     Shipping address confirmed in Epic.     Delivery Scheduled: Yes, Expected medication delivery date: 100720.     Medication will be delivered via Same Day Courier to the home address in Epic [place].    [name] D [name]   [facility] Specialty Technician

## 2019-09-05 ENCOUNTER — Other Ambulatory Visit: Payer: Self-pay | Admitting: Physician Assistant

## 2019-09-05 ENCOUNTER — Other Ambulatory Visit (HOSPITAL_COMMUNITY): Payer: Self-pay | Admitting: Physician Assistant

## 2019-09-05 DIAGNOSIS — IMO0001 Reserved for inherently not codable concepts without codable children: Secondary | ICD-10-CM

## 2019-09-05 DIAGNOSIS — H9042 Sensorineural hearing loss, unilateral, left ear, with unrestricted hearing on the contralateral side: Secondary | ICD-10-CM

## 2019-09-06 MED FILL — DUPIXENT 300 MG/2 ML SUBCUTANEOUS SYRINGE: SUBCUTANEOUS | 28 days supply | Qty: 4 | Fill #3

## 2019-09-06 MED FILL — DUPIXENT 300 MG/2 ML SUBCUTANEOUS SYRINGE: 28 days supply | Qty: 4 | Fill #3 | Status: AC

## 2019-09-15 ENCOUNTER — Other Ambulatory Visit: Payer: Self-pay

## 2019-09-15 ENCOUNTER — Ambulatory Visit
Admission: RE | Admit: 2019-09-15 | Discharge: 2019-09-15 | Disposition: A | Discharge status: Home / Self Care | Payer: Medicare HMO | Source: Ambulatory Visit | Attending: Physician Assistant | Admitting: Physician Assistant

## 2019-09-15 DIAGNOSIS — H9042 Sensorineural hearing loss, unilateral, left ear, with unrestricted hearing on the contralateral side: Secondary | ICD-10-CM | POA: Diagnosis not present

## 2019-09-15 DIAGNOSIS — IMO0001 Reserved for inherently not codable concepts without codable children: Secondary | ICD-10-CM

## 2019-09-15 MED ORDER — GADOBUTROL 1 MMOL/ML IV SOLN
10.0000 mL | Freq: Once | INTRAVENOUS | Status: AC | PRN
  Administered 2019-09-15: 10 mL via INTRAVENOUS

## 2019-09-29 NOTE — Unmapped (Signed)
[facility] Refill Coordination Note    Specialty Medication(s) to be Shipped:   Inflammatory Disorders: Dupixent    Other medication(s) to be shipped: na     Samantha Miles, DOB: January 21, 1986  Phone: [phone] (home)       All above HIPAA information was verified with patient.     Completed refill call assessment today to schedule patient's medication shipment from the [facility] [phone]).       Specialty medication(s) and dose(s) confirmed: Regimen is correct and unchanged.   Changes to medications: [name] reports no changes at this time.  Changes to insurance: No  Questions for the pharmacist: No    Confirmed patient received Welcome Packet with first shipment. The patient will receive a drug information handout for each medication shipped and additional FDA Medication Guides as required.       DISEASE/MEDICATION-SPECIFIC INFORMATION        For patients on injectable medications: Patient currently has 0 doses left.  Next injection is scheduled for 110420.    SPECIALTY MEDICATION ADHERENCE     Medication Adherence    Patient reported X missed doses in the last month: 0  Specialty Medication: Dupixent 300 mg/2 ml  Patient is on additional specialty medications: No  Any gaps in refill history greater than 2 weeks in the last 3 months: no  Demonstrates understanding of importance of adherence: yes  Informant: patient  Reliability of informant: reliable  Confirmed plan for next specialty medication refill: delivery by pharmacy  Refills needed for supportive medications: not needed                Dupixent 300 mg/2 ml. 0 on hand      SHIPPING     Shipping address confirmed in Epic.     Delivery Scheduled: Yes, Expected medication delivery date: 110320.     Medication will be delivered via Same Day Courier to the prescription address in Epic WAM.    Samantha Miles D Mattingly Fountaine   [facility] Specialty Technician

## 2019-10-03 MED FILL — DUPIXENT 300 MG/2 ML SUBCUTANEOUS SYRINGE: SUBCUTANEOUS | 28 days supply | Qty: 4 | Fill #4

## 2019-10-03 MED FILL — DUPIXENT 300 MG/2 ML SUBCUTANEOUS SYRINGE: 28 days supply | Qty: 4 | Fill #4 | Status: AC

## 2019-10-12 ENCOUNTER — Other Ambulatory Visit: Payer: Self-pay

## 2019-10-12 ENCOUNTER — Emergency Department: Payer: Medicare HMO

## 2019-10-12 ENCOUNTER — Encounter: Payer: Self-pay | Admitting: Emergency Medicine

## 2019-10-12 DIAGNOSIS — E860 Dehydration: Secondary | ICD-10-CM | POA: Diagnosis not present

## 2019-10-12 DIAGNOSIS — Z791 Long term (current) use of non-steroidal anti-inflammatories (NSAID): Secondary | ICD-10-CM | POA: Diagnosis not present

## 2019-10-12 DIAGNOSIS — R0789 Other chest pain: Secondary | ICD-10-CM | POA: Insufficient documentation

## 2019-10-12 DIAGNOSIS — Z79899 Other long term (current) drug therapy: Secondary | ICD-10-CM | POA: Insufficient documentation

## 2019-10-12 DIAGNOSIS — R079 Chest pain, unspecified: Secondary | ICD-10-CM | POA: Diagnosis present

## 2019-10-12 DIAGNOSIS — G43901 Migraine, unspecified, not intractable, with status migrainosus: Secondary | ICD-10-CM | POA: Insufficient documentation

## 2019-10-12 LAB — BASIC METABOLIC PANEL
Anion gap: 11 (ref 5–15)
BUN: 7 mg/dL (ref 6–20)
CO2: 27 mmol/L (ref 22–32)
Calcium: 9.2 mg/dL (ref 8.9–10.3)
Chloride: 103 mmol/L (ref 98–111)
Creatinine, Ser: 0.84 mg/dL (ref 0.44–1.00)
GFR calc Af Amer: >60 mL/min (ref >60)
GFR calc non Af Amer: >60 mL/min (ref >60)
Glucose, Bld: 97 mg/dL (ref 70–99)
Potassium: 3.8 mmol/L (ref 3.5–5.1)
Sodium: 141 mmol/L (ref 135–145)

## 2019-10-12 LAB — CBC
HCT: 43.1 % (ref 36.0–46.0)
Hemoglobin: 14.5 g/dL (ref 12.0–15.0)
MCH: 28.9 pg (ref 26.0–34.0)
MCHC: 33.6 g/dL (ref 30.0–36.0)
MCV: 85.9 fL (ref 80.0–100.0)
Platelets: 297 10*3/uL (ref 150–400)
RBC: 5.02 MIL/uL (ref 3.87–5.11)
RDW: 14.2 % (ref 11.5–15.5)
WBC: 8 10*3/uL (ref 4.0–10.5)
nRBC: 0 % (ref 0.0–0.2)

## 2019-10-12 LAB — TROPONIN I (HIGH SENSITIVITY): Troponin I (High Sensitivity): <2 ng/L (ref <18)

## 2019-10-12 NOTE — ED Triage Notes (Signed)
Pt in via POV, reports new onset centralized chest pain, denies radiation, denies associated symptoms.  NAD noted at this time.

## 2019-10-13 ENCOUNTER — Emergency Department
Admission: EM | Admit: 2019-10-13 | Discharge: 2019-10-13 | Disposition: A | Discharge status: Home / Self Care | Payer: Medicare HMO | Attending: Emergency Medicine | Admitting: Emergency Medicine

## 2019-10-13 DIAGNOSIS — G43901 Migraine, unspecified, not intractable, with status migrainosus: Secondary | ICD-10-CM

## 2019-10-13 DIAGNOSIS — E860 Dehydration: Secondary | ICD-10-CM

## 2019-10-13 DIAGNOSIS — R0789 Other chest pain: Secondary | ICD-10-CM

## 2019-10-13 LAB — TROPONIN I (HIGH SENSITIVITY): Troponin I (High Sensitivity): 3 ng/L (ref <18)

## 2019-10-13 MED ORDER — KETOROLAC TROMETHAMINE 30 MG/ML IJ SOLN: 15.0000 mg | INTRAMUSCULAR | Status: DC

## 2019-10-13 MED ORDER — ONDANSETRON 4 MG PO TBDP
4.0000 mg | ORAL_TABLET | Freq: Once | ORAL | Status: AC
  Administered 2019-10-13: 02:00:00 4 mg via ORAL
  Filled 2019-10-13: qty 1

## 2019-10-13 MED ORDER — KETOROLAC TROMETHAMINE 60 MG/2ML IM SOLN
15.0000 mg | Freq: Once | INTRAMUSCULAR | Status: AC
  Administered 2019-10-13: 15 mg via INTRAMUSCULAR
  Filled 2019-10-13: qty 2

## 2019-10-13 MED ORDER — SODIUM CHLORIDE 0.9 % IV BOLUS: 500.0000 mL | Freq: Once | INTRAVENOUS | Status: DC

## 2019-10-13 MED ORDER — FAMOTIDINE 20 MG PO TABS: 20.0000 mg | ORAL_TABLET | Freq: Two times a day (BID) | ORAL | 0 refills | Status: DC

## 2019-10-13 MED ORDER — METOCLOPRAMIDE HCL 10 MG PO TABS: 10.0000 mg | ORAL_TABLET | Freq: Four times a day (QID) | ORAL | 0 refills | Status: DC | PRN

## 2019-10-13 MED ORDER — ONDANSETRON HCL 4 MG/2ML IJ SOLN: 4.0000 mg | Freq: Once | INTRAMUSCULAR | Status: DC

## 2019-10-13 MED ORDER — NAPROXEN 500 MG PO TABS: 500.0000 mg | ORAL_TABLET | Freq: Two times a day (BID) | ORAL | 0 refills | Status: DC

## 2019-10-13 NOTE — ED Provider Notes (Signed)
[facility] Emergency Department Provider Note  ____________________________________________  Time seen: Approximately 1:24 AM  I have reviewed the triage vital signs and the nursing notes.   HISTORY  Chief Complaint Chest Pain    HPI Brittney Sanders is a 33 y.o. female with a history of depression, kidney stones, migraines, UTIs, asthma who comes the ED complaining of  chest pain that started 5:00 PM today.  Gradual onset, nonradiating, no associated shortness of breath diaphoresis vomiting lightheadedness.  Not exertional, not pleuritic.  It is constant, described as a tightness or "grabbing" feeling.  She was in her usual state of health until this morning around 9:00 AM when she had onset of a typical migraine headache for her.  She had recently been prescribed sumatriptan to try for the symptoms by her neurologist.  She did not take it at headache onset, but waited until about 4:00 PM today to take it.  It has not helped her headache, but about an hour after taking it she developed this chest pain.  Headache is not thunderclap in onset.  She notes that she has not been eating or drinking today because she has not been feeling well with the migraine headache.  Denies any paresthesias weakness or vision changes.   Past Medical History:  Diagnosis Date  . Depression   . Eczema   . Hearing deficit   . Heel spur   . Kidney stone   . Left nephrolithiasis   . Nocturia   . Obesity   . Overweight   . Plantar fasciitis   . Seasonal allergies   . UTI (lower urinary tract infection)      Patient Active Problem List   Diagnosis Date Noted  . 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 Auditory disturbance 12/30/2015  . Adiposity 12/30/2015  . Renal stone 12/17/2015  . Pyelonephritis 12/17/2015  . 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 Calculus of kidney 08/16/2014  . Allergic rhinitis 04/03/2014  . Stress fracture of calcaneus 03/14/2014  . Calcaneal spur  02/23/2014  . Plantar fasciitis 02/23/2014     Past Surgical History:  Procedure Laterality Date  . TUBAL LIGATION    . TYMPANOSTOMY TUBE PLACEMENT Right      Prior to Admission medications   Medication Sig Start Date End Date Taking? Authorizing Provider  fexofenadine (ALLEGRA) 180 MG tablet Take 180 mg by mouth daily.    [provider]  meloxicam (MOBIC) 15 MG tablet Take 15 mg by mouth daily.    [provider]  naproxen (NAPROSYN) 500 MG tablet Take 1 tablet (500 mg total) by mouth 2 (two) times daily with a meal. 05/23/19 05/22/20  Enid Derry, PA-C  nystatin (MYCOSTATIN/NYSTOP) powder  05/16/16   [provider]  ondansetron (ZOFRAN) 4 MG tablet Take 1 tablet (4 mg total) by mouth every 8 (eight) hours as needed for nausea or vomiting. 09/09/18   Jeanmarie Plant, MD  PARoxetine (PAXIL) 40 MG tablet Take 40 mg by mouth 2 (two) times a day.    [provider]     Allergies Sulfa antibiotics and Sulfamethoxazole   Family History  Problem Relation Age of Onset  . Urolithiasis Maternal Grandmother   . Kidney disease Neg Hx   . Bladder Cancer Neg Hx   . Kidney cancer Neg Hx     Social History Social History   Tobacco Use  . Smoking status: Never Smoker  . Smokeless tobacco: Never Used  Substance Use Topics  . Alcohol  use: No    Alcohol/week: 0.0 standard drinks  . Drug use: No    Review of Systems  Constitutional:   No fever or chills.  ENT:   No sore throat. No rhinorrhea. Cardiovascular: Positive chest pain as above without syncope. Respiratory:   No dyspnea or cough. Gastrointestinal:   Negative for abdominal pain, vomiting and diarrhea.  Musculoskeletal:   Negative for focal pain or swelling All other systems reviewed and are negative except as documented above in ROS and HPI.  ____________________________________________   PHYSICAL EXAM:  VITAL SIGNS: ED Triage Vitals  Enc Vitals Group     BP 10/12/19 1911 (!)  145/98     Pulse Rate 10/12/19 1911 94     Resp 10/12/19 1911 16     Temp 10/12/19 1911 99.4 F (37.4 C)     Temp Source 10/12/19 1911 Oral     SpO2 10/12/19 1911 99 %     Weight 10/12/19 1907 290 lb (131.5 kg)     Height 10/12/19 1907 5\' 3"  (1.6 m)     Head Circumference --      Peak Flow --      Pain Score 10/12/19 1907 8     Pain Loc --      Pain Edu? --      Excl. in GC? --     Vital signs reviewed, nursing assessments reviewed.   Constitutional:   Alert and oriented. Non-toxic appearance. Eyes:   Conjunctivae are normal. EOMI. PERRL. ENT      Head:   Normocephalic and atraumatic.      Nose:   Wearing a mask.      Mouth/Throat:   Wearing a mask.      Neck:   No meningismus. Full ROM. Hematological/Lymphatic/Immunilogical:   No cervical lymphadenopathy. Cardiovascular:   RRR. Symmetric bilateral radial and DP pulses.  No murmurs. Cap refill less than 2 seconds.  Heart rate increases from 90-110 with change in position from semirecumbent to sitting upright. Respiratory:   Normal respiratory effort without tachypnea/retractions. Breath sounds are clear and equal bilaterally. No wheezes/rales/rhonchi. Gastrointestinal:   Soft and nontender. Non distended. There is no CVA tenderness.  No rebound, rigidity, or guarding.  Musculoskeletal:   Normal range of motion in all extremities. No joint effusions.  No lower extremity tenderness.  No edema.  Anterior chest wall tender to the touch over the upper sternum reproducing her chest pain symptoms. Neurologic:   Normal speech and language.  Cranial nerves III through XII intact Motor grossly intact. No acute focal neurologic deficits are appreciated.  Skin:    Skin is warm, dry and intact. No rash noted.  No petechiae, purpura, or bullae.  ____________________________________________    LABS (pertinent positives/negatives) (all labs ordered are listed, but only abnormal results are displayed) Labs Reviewed  BASIC METABOLIC PANEL   CBC  TROPONIN I (HIGH SENSITIVITY)  TROPONIN I (HIGH SENSITIVITY)   ____________________________________________   EKG  Interpreted by me Normal sinus rhythm rate of 93, normal axis and intervals.  Poor R wave progression.  Normal ST segments and T waves.  ____________________________________________    RADIOLOGY  Dg Chest 2 View  Result Date: 10/12/2019 CLINICAL DATA:  33 year old female with history of chest pain. EXAM: CHEST - 2 VIEW COMPARISON:  Chest x-ray 09/08/2017. FINDINGS: Lung volumes are normal. No consolidative airspace disease. No pleural effusions. No pneumothorax. No pulmonary nodule or mass noted. Pulmonary vasculature and the cardiomediastinal silhouette are within normal limits. IMPRESSION: No  radiographic evidence of acute cardiopulmonary disease. Electronically Signed   By: Vinnie Langton M.D.   On: 10/12/2019 19:33    ____________________________________________   PROCEDURES Procedures  ____________________________________________  DIFFERENTIAL DIAGNOSIS   Migraine headache, medication side effect, dehydration, chest wall pain, anxiety  CLINICAL IMPRESSION / ASSESSMENT AND PLAN / ED COURSE  Medications ordered in the ED: Medications  ketorolac (TORADOL) 30 MG/ML injection 15 mg (has no administration in time range)  sodium chloride 0.9 % bolus 500 mL (has no administration in time range)  ondansetron (ZOFRAN) injection 4 mg (has no administration in time range)    Pertinent labs & imaging results that were available during my care of the patient were reviewed by me and considered in my medical decision making (see chart for details).  Gerianne M Reitan was evaluated in Emergency Department on 10/13/2019 for the symptoms described in the history of present illness. She was evaluated in the context of the global COVID-19 pandemic, which necessitated consideration that the patient might be at risk for infection with the SARS-CoV-2 virus that causes  COVID-19. Institutional protocols and algorithms that pertain to the evaluation of patients at risk for COVID-19 are in a state of rapid change based on information released by regulatory bodies including the CDC and federal and state organizations. These policies and algorithms were followed during the patient's care in the ED.   Patient presents with atypical chest pain/chest wall pain in the setting of migraine headache and poor oral intake today resulting in a degree of dehydration.Considering the patient's symptoms, medical history, and physical examination today, I have low suspicion for ACS, PE, TAD, pneumothorax, carditis, mediastinitis, pneumonia, CHF, or sepsis.  Doubt meningitis encephalitis stroke arterial dissection or occlusion, venous sinus thrombosis, glaucoma, pseudotumor, mass.  EKG is unremarkable.  Labs are normal.  Troponin was sent as part of a nursing triage protocol and is negative.  However, cardiac work-up not indicated at this time and no further cardiac testing is needed.  With Toradol and Zofran for the migraine headache and chest wall pain.  IV fluids for hydration, plan for continued hydration at home and outpatient follow-up.          ____________________________________________   FINAL CLINICAL IMPRESSION(S) / ED DIAGNOSES    Final diagnoses:  Atypical chest pain  Migraine with status migrainosus, not intractable, unspecified migraine type  Dehydration     ED Discharge Orders    None      Portions of this note were generated with dragon dictation software. Dictation errors may occur despite best attempts at proofreading.   Carrie Mew, MD 10/13/19 [phone]

## 2019-10-13 NOTE — ED Notes (Signed)
1 unsuccessful IV attempt by this RN (right AC).

## 2019-10-21 ENCOUNTER — Emergency Department
Admission: EM | Admit: 2019-10-21 | Discharge: 2019-10-21 | Disposition: A | Discharge status: Home / Self Care | Payer: Medicare HMO | Attending: Emergency Medicine | Admitting: Emergency Medicine

## 2019-10-21 ENCOUNTER — Emergency Department: Payer: Medicare HMO

## 2019-10-21 ENCOUNTER — Other Ambulatory Visit: Payer: Self-pay

## 2019-10-21 DIAGNOSIS — Z79899 Other long term (current) drug therapy: Secondary | ICD-10-CM | POA: Insufficient documentation

## 2019-10-21 DIAGNOSIS — L02215 Cutaneous abscess of perineum: Secondary | ICD-10-CM | POA: Diagnosis not present

## 2019-10-21 DIAGNOSIS — M5432 Sciatica, left side: Secondary | ICD-10-CM | POA: Diagnosis not present

## 2019-10-21 DIAGNOSIS — N390 Urinary tract infection, site not specified: Secondary | ICD-10-CM | POA: Insufficient documentation

## 2019-10-21 DIAGNOSIS — R1012 Left upper quadrant pain: Secondary | ICD-10-CM | POA: Diagnosis present

## 2019-10-21 DIAGNOSIS — L0291 Cutaneous abscess, unspecified: Secondary | ICD-10-CM

## 2019-10-21 DIAGNOSIS — R109 Unspecified abdominal pain: Secondary | ICD-10-CM

## 2019-10-21 LAB — POCT PREGNANCY, URINE: Preg Test, Ur: NEGATIVE

## 2019-10-21 LAB — BASIC METABOLIC PANEL
Anion gap: 10 (ref 5–15)
BUN: 9 mg/dL (ref 6–20)
CO2: 26 mmol/L (ref 22–32)
Calcium: 9.1 mg/dL (ref 8.9–10.3)
Chloride: 103 mmol/L (ref 98–111)
Creatinine, Ser: 0.97 mg/dL (ref 0.44–1.00)
GFR calc Af Amer: >60 mL/min (ref >60)
GFR calc non Af Amer: >60 mL/min (ref >60)
Glucose, Bld: 133 mg/dL — ABNORMAL HIGH (ref 70–99)
Potassium: 3.7 mmol/L (ref 3.5–5.1)
Sodium: 139 mmol/L (ref 135–145)

## 2019-10-21 LAB — URINALYSIS, COMPLETE (UACMP) WITH MICROSCOPIC
Bacteria, UA: NONE SEEN
Bilirubin Urine: NEGATIVE
Glucose, UA: NEGATIVE mg/dL
Hgb urine dipstick: NEGATIVE
Ketones, ur: NEGATIVE mg/dL
Nitrite: NEGATIVE
Protein, ur: NEGATIVE mg/dL
Specific Gravity, Urine: 1.006 (ref 1.005–1.030)
WBC, UA: >50 WBC/hpf — ABNORMAL HIGH (ref 0–5)
pH: 7 (ref 5.0–8.0)

## 2019-10-21 LAB — CBC
HCT: 39.7 % (ref 36.0–46.0)
Hemoglobin: 13 g/dL (ref 12.0–15.0)
MCH: 28.8 pg (ref 26.0–34.0)
MCHC: 32.7 g/dL (ref 30.0–36.0)
MCV: 88 fL (ref 80.0–100.0)
Platelets: 347 10*3/uL (ref 150–400)
RBC: 4.51 MIL/uL (ref 3.87–5.11)
RDW: 14.8 % (ref 11.5–15.5)
WBC: 9 10*3/uL (ref 4.0–10.5)
nRBC: 0 % (ref 0.0–0.2)

## 2019-10-21 LAB — LACTIC ACID, PLASMA: Lactic Acid, Venous: 1.3 mmol/L (ref 0.5–1.9)

## 2019-10-21 MED ORDER — ONDANSETRON 4 MG PO TBDP: 4.0000 mg | ORAL_TABLET | Freq: Three times a day (TID) | ORAL | 0 refills | Status: DC | PRN

## 2019-10-21 MED ORDER — CEPHALEXIN 500 MG PO CAPS: 500.0000 mg | ORAL_CAPSULE | Freq: Three times a day (TID) | ORAL | 0 refills | Status: DC

## 2019-10-21 MED ORDER — FENTANYL CITRATE (PF) 100 MCG/2ML IJ SOLN
50.0000 ug | Freq: Once | INTRAMUSCULAR | Status: AC
  Administered 2019-10-21: 50 ug via INTRAVENOUS
  Filled 2019-10-21: qty 2

## 2019-10-21 MED ORDER — ONDANSETRON HCL 4 MG/2ML IJ SOLN
4.0000 mg | Freq: Once | INTRAMUSCULAR | Status: AC
  Administered 2019-10-21: 4 mg via INTRAVENOUS
  Filled 2019-10-21: qty 2

## 2019-10-21 MED ORDER — OXYCODONE-ACETAMINOPHEN 5-325 MG PO TABS: 1.0000 | ORAL_TABLET | ORAL | 0 refills | Status: DC | PRN

## 2019-10-21 MED ORDER — SODIUM CHLORIDE 0.9 % IV SOLN
1.0000 g | Freq: Once | INTRAVENOUS | Status: AC
  Administered 2019-10-21: 1 g via INTRAVENOUS
  Filled 2019-10-21: qty 10

## 2019-10-21 MED ORDER — CLINDAMYCIN HCL 300 MG PO CAPS: 300.0000 mg | ORAL_CAPSULE | Freq: Three times a day (TID) | ORAL | 0 refills | Status: DC

## 2019-10-21 MED ORDER — SODIUM CHLORIDE 0.9 % IV BOLUS
1000.0000 mL | Freq: Once | INTRAVENOUS | Status: AC
  Administered 2019-10-21: 1000 mL via INTRAVENOUS

## 2019-10-21 MED ORDER — CLINDAMYCIN HCL 150 MG PO CAPS
300.0000 mg | ORAL_CAPSULE | Freq: Once | ORAL | Status: AC
  Administered 2019-10-21: 300 mg via ORAL
  Filled 2019-10-21: qty 2

## 2019-10-21 NOTE — ED Notes (Signed)
Patient transported to CT 

## 2019-10-21 NOTE — ED Triage Notes (Signed)
Patient c/o left flank pain and increased urination. Patient also c/o left leg pain, radiating down the entire leg, described as burning.

## 2019-10-21 NOTE — ED Notes (Signed)
CT called for pt ready 

## 2019-10-21 NOTE — ED Provider Notes (Signed)
[facility] Emergency Department Provider Note   ____________________________________________   First MD Initiated Contact with Patient 10/21/19 [phone]     (approximate)  I have reviewed the triage vital signs and the nursing notes.   HISTORY  Chief Complaint Flank Pain and Leg Pain    HPI Brittney Sanders is a 33 y.o. female who presents to the ED from home with a chief complaint of left flank pain, nausea and increased urination.  History of kidney stones.  Symptoms started yesterday.  Also complains of left leg pain radiating down her entire leg which she describes as a burning sensation.  Denies fall/injury/trauma.  Denies extremity weakness, numbness/tingling, bowel or bladder incontinence.  Denies fever, cough, chest pain, shortness of breath, vomiting.      Past Medical History:  Diagnosis Date  . Depression   . Eczema   . Hearing deficit   . Heel spur   . Kidney stone   . Left nephrolithiasis   . Nocturia   . Obesity   . Overweight   . Plantar fasciitis   . Seasonal allergies   . UTI (lower urinary tract infection)     Patient Active Problem List   Diagnosis Date Noted  . 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 Auditory disturbance 12/30/2015  . Adiposity 12/30/2015  . Renal stone 12/17/2015  . Pyelonephritis 12/17/2015  . 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 Calculus of kidney 08/16/2014  . Allergic rhinitis 04/03/2014  . Stress fracture of calcaneus 03/14/2014  . Calcaneal spur 02/23/2014  . Plantar fasciitis 02/23/2014    Past Surgical History:  Procedure Laterality Date  . TUBAL LIGATION    . TYMPANOSTOMY TUBE PLACEMENT Right     Prior to Admission medications   Medication Sig Start Date End Date Taking? Authorizing Provider  cephALEXin (KEFLEX) 500 MG capsule Take 1 capsule (500 mg total) by mouth 3 (three) times daily. 10/21/19   Paulette Blanch, MD  clindamycin (CLEOCIN) 300 MG capsule Take 1 capsule (300 mg total) by mouth  3 (three) times daily. 10/21/19   Paulette Blanch, MD  famotidine (PEPCID) 20 MG tablet Take 1 tablet (20 mg total) by mouth 2 (two) times daily. 10/13/19   Carrie Mew, MD  fexofenadine (ALLEGRA) 180 MG tablet Take 180 mg by mouth daily.    [provider]  meloxicam (MOBIC) 15 MG tablet Take 15 mg by mouth daily.    [provider]  metoCLOPramide (REGLAN) 10 MG tablet Take 1 tablet (10 mg total) by mouth every 6 (six) hours as needed. 10/13/19   Carrie Mew, MD  naproxen (NAPROSYN) 500 MG tablet Take 1 tablet (500 mg total) by mouth 2 (two) times daily with a meal. 10/13/19   Carrie Mew, MD  nystatin (MYCOSTATIN/NYSTOP) powder  05/16/16   [provider]  ondansetron (ZOFRAN ODT) 4 MG disintegrating tablet Take 1 tablet (4 mg total) by mouth every 8 (eight) hours as needed for nausea or vomiting. 10/21/19   Paulette Blanch, MD  ondansetron (ZOFRAN) 4 MG tablet Take 1 tablet (4 mg total) by mouth every 8 (eight) hours as needed for nausea or vomiting. 09/09/18   Schuyler Amor, MD  oxyCODONE-acetaminophen (PERCOCET/ROXICET) 5-325 MG tablet Take 1 tablet by mouth every 4 (four) hours as needed for severe pain. 10/21/19   Paulette Blanch, MD  PARoxetine (PAXIL) 40 MG tablet Take 40 mg by mouth 2 (two) times a day.    [provider]  Allergies Sulfa antibiotics and Sulfamethoxazole  Family History  Problem Relation Age of Onset  . Urolithiasis Maternal Grandmother   . Kidney disease Neg Hx   . Bladder Cancer Neg Hx   . Kidney cancer Neg Hx     Social History Social History   Tobacco Use  . Smoking status: Never Smoker  . Smokeless tobacco: Never Used  Substance Use Topics  . Alcohol use: No    Alcohol/week: 0.0 standard drinks  . Drug use: No    Review of Systems  Constitutional: No fever/chills Eyes: No visual changes. ENT: No sore throat. Cardiovascular: Denies chest pain. Respiratory: Denies shortness of breath.  Gastrointestinal: No abdominal pain.  Positive for left flank pain and nausea, no vomiting.  No diarrhea.  No constipation. Genitourinary: Positive for frequency.  Negative for dysuria. Musculoskeletal: Negative for back pain. Skin: Negative for rash. Neurological: Negative for headaches, focal weakness or numbness.   ____________________________________________   PHYSICAL EXAM:  VITAL SIGNS: ED Triage Vitals  Enc Vitals Group     BP 10/21/19 0237 (!) 173/94     Pulse Rate 10/21/19 0237 (!) 104     Resp 10/21/19 0237 18     Temp 10/21/19 0237 99.2 F (37.3 C)     Temp src --      SpO2 10/21/19 0237 100 %     Weight 10/21/19 0238 260 lb (117.9 kg)     Height 10/21/19 0238 5\' 3"  (1.6 m)     Head Circumference --      Peak Flow --      Pain Score 10/21/19 0237 8     Pain Loc --      Pain Edu? --      Excl. in GC? --     Constitutional: Alert and oriented. Well appearing and in no acute distress. Eyes: Conjunctivae are normal. PERRL. EOMI. Head: Atraumatic. Nose: No congestion/rhinnorhea. Mouth/Throat: Mucous membranes are moist.  Oropharynx non-erythematous. Neck: No stridor.  Supple neck without meningismus. Cardiovascular: Normal rate, regular rhythm. Grossly normal heart sounds.  Good peripheral circulation. Respiratory: Normal respiratory effort.  No retractions. Lungs CTAB. Gastrointestinal: Obese.  Soft and nontender to light or deep palpation. No distention. No abdominal bruits.  Mild left CVA tenderness. Musculoskeletal: No spinal tenderness to palpation.  Negative straight leg raise bilaterally.  2+ femoral and distal pulses.  No lower extremity tenderness nor edema.  No joint effusions. Neurologic:  Normal speech and language. No gross focal neurologic deficits are appreciated. No gait instability. Skin:  Skin is warm, dry and intact. No rash noted. Psychiatric: Mood and affect are normal. Speech and behavior are normal.   ____________________________________________   LABS (all labs ordered are listed, but only abnormal results are displayed)  Labs Reviewed  URINALYSIS, COMPLETE (UACMP) WITH MICROSCOPIC - Abnormal; Notable for the following components:      Result Value   Color, Urine STRAW (*)    APPearance HAZY (*)    Leukocytes,Ua MODERATE (*)    WBC, UA >50 (*)    Non Squamous Epithelial PRESENT (*)    All other components within normal limits  BASIC METABOLIC PANEL - Abnormal; Notable for the following components:   Glucose, Bld 133 (*)    All other components within normal limits  CULTURE, BLOOD (ROUTINE X 2)  CULTURE, BLOOD (ROUTINE X 2)  URINE CULTURE  CBC  LACTIC ACID, PLASMA  LACTIC ACID, PLASMA  POC URINE PREG, ED  POCT PREGNANCY, URINE   ____________________________________________  EKG  None ____________________________________________  RADIOLOGY  ED MD interpretation: 6 mm nonobstructing left renal stone at the lower pole of the kidney.  Right ovarian cyst.  Official radiology report(s): Ct Renal Stone Study  Result Date: 10/21/2019 CLINICAL DATA:  Initial evaluation for acute left flank pain. EXAM: CT ABDOMEN AND PELVIS WITHOUT CONTRAST TECHNIQUE: Multidetector CT imaging of the abdomen and pelvis was performed following the standard protocol without IV contrast. COMPARISON:  Prior CT from 09/29/2018. FINDINGS: Lower chest: Mild subsegmental atelectatic changes present dependently within the visualized lung bases. Visualized lungs are otherwise clear. Hepatobiliary: Mild diffuse hypoattenuation of the liver consistent with steatosis. Liver otherwise unremarkable. Gallbladder within normal limits. No biliary dilatation. Pancreas: Mild diffuse fatty infiltration the pancreas noted. Pancreas otherwise unremarkable without acute abnormality. Spleen: Spleen within normal limits. Adrenals/Urinary Tract: Adrenal glands are normal. Right kidney within normal limits without  nephrolithiasis or hydronephrosis. No radiopaque calculi seen along the course of the right renal collecting system. No right-sided hydroureter. Multifocal cortical scarring noted about the left kidney. 6 mm nonobstructive calculus present at the lower pole. No obstructive radiopaque calculi seen along the course of the left renal collecting system. No left-sided hydronephrosis or hydroureter. Partially distended bladder within normal limits. No layering stones seen within the bladder lumen. Stomach/Bowel: Stomach within normal limits. No evidence for bowel obstruction. Appendix mildly prominent measuring up to 11 mm in diameter, but demonstrates no associated inflammatory changes to suggest acute appendicitis. This is stable from previous. No acute inflammatory changes seen elsewhere about the bowels. Vascular/Lymphatic: Intra-abdominal aorta of normal caliber. No adenopathy. Reproductive: Uterus within normal limits. Left ovary unremarkable. 17 mm right ovarian cyst, likely a normal functional cyst. Other: No free air or fluid. Musculoskeletal: No acute osseous finding. No discrete lytic or blastic osseous lesions. Moderate degenerative spondylolysis noted at L5-S1. IMPRESSION: 1. 6 mm nonobstructive left renal nephrolithiasis. No CT evidence for ureterolithiasis or obstructive uropathy. 2. No other acute intra-abdominal or pelvic process. 3. Hepatic steatosis. Electronically Signed   By: Rise Mu M.D.   On: 10/21/2019 05:25    ____________________________________________   PROCEDURES  Procedure(s) performed (including Critical Care):  Procedures   ____________________________________________   INITIAL IMPRESSION / ASSESSMENT AND PLAN / ED COURSE  As part of my medical decision making, I reviewed the following data within the electronic MEDICAL RECORD NUMBER Nursing notes reviewed and incorporated, Labs reviewed, Old chart reviewed, Radiograph reviewed, Notes from prior ED visits and [place]  Controlled Substance Database     Brittney Sanders was evaluated in Emergency Department on 10/21/2019 for the symptoms described in the history of present illness. She was evaluated in the context of the global COVID-19 pandemic, which necessitated consideration that the patient might be at risk for infection with the SARS-CoV-2 virus that causes COVID-19. Institutional protocols and algorithms that pertain to the evaluation of patients at risk for COVID-19 are in a state of rapid change based on information released by regulatory bodies including the CDC and federal and state organizations. These policies and algorithms were followed during the patient's care in the ED.    33 year old female who presents with left flank pain. Differential diagnosis includes, but is not limited to, ovarian cyst, ovarian torsion, acute appendicitis, diverticulitis, urinary tract infection/pyelonephritis, endometriosis, bowel obstruction, colitis, renal colic, gastroenteritis, hernia, fibroids, endometriosis, pregnancy related pain including ectopic pregnancy, etc.   Laboratory results remarkable for moderate leukocytes and greater than 50 WBC.  Normal WBC and kidney function.  Will obtain CT renal colic study  given patient has a history of stones.  Initiate IV fluid resuscitation, IV Rocephin, fentanyl and Zofran.  Will reassess.   Clinical Course as of Oct 20 699  Sat Oct 21, 2019  9147 Patient feeling much better.  Updated her on CT scan result.  She is afebrile with normal lactic acid and nonobstructing stone.  Feel patient would be safe for discharge home with follow-up with her urologist at [facility] urology.  Also patient shows me a tiny drained abscess on her upper right mons which has been there for over 1 week.  Also tells me about her left back pain shooting down her leg not associated with extremity weakness, numbness/tingling, bowel or bladder incontinence.  Will discharge home with prescriptions for Flomax,  Percocet and Zofran.  She will follow-up with her PCP as well as urology.  Strict return precautions given.  Patient verbalizes understanding and agrees with plan of care.   [JS]    Clinical Course User Index [JS] Irean Hong, MD     ____________________________________________   FINAL CLINICAL IMPRESSION(S) / ED DIAGNOSES  Final diagnoses:  Left flank pain  Urinary tract infection without hematuria, site unspecified  Abscess  Sciatica of left side     ED Discharge Orders         Ordered    cephALEXin (KEFLEX) 500 MG capsule  3 times daily     10/21/19 0659    clindamycin (CLEOCIN) 300 MG capsule  3 times daily     10/21/19 0659    oxyCODONE-acetaminophen (PERCOCET/ROXICET) 5-325 MG tablet  Every 4 hours PRN     10/21/19 0659    ondansetron (ZOFRAN ODT) 4 MG disintegrating tablet  Every 8 hours PRN     10/21/19 8295           Note:  This document was prepared using Dragon voice recognition software and may include unintentional dictation errors.   Irean Hong, MD 10/21/19 [phone]

## 2019-10-21 NOTE — ED Notes (Signed)
ED Provider at bedside. 

## 2019-10-21 NOTE — Discharge Instructions (Addendum)
1.  Take antibiotics as prescribed: Keflex 500 mg 3 times daily x7 days Clindamycin 300 mg 3 times daily x10 days 2.  You may take medicines as needed for pain and nausea (Percocet/Zofran #15). 3.  Return to the ER for worsening symptoms, persistent vomiting, difficulty breathing or other concerns.

## 2019-10-23 ENCOUNTER — Ambulatory Visit (INDEPENDENT_AMBULATORY_CARE_PROVIDER_SITE_OTHER): Payer: Medicare HMO | Admitting: Urology

## 2019-10-23 ENCOUNTER — Encounter: Payer: Self-pay | Admitting: Urology

## 2019-10-23 ENCOUNTER — Other Ambulatory Visit: Payer: Self-pay

## 2019-10-23 VITALS — BP 147/86 | HR 103 | Ht 63.0 in | Wt 298.0 lb

## 2019-10-23 DIAGNOSIS — N3001 Acute cystitis with hematuria: Secondary | ICD-10-CM | POA: Diagnosis not present

## 2019-10-23 DIAGNOSIS — N200 Calculus of kidney: Secondary | ICD-10-CM | POA: Diagnosis not present

## 2019-10-23 DIAGNOSIS — Z87448 Personal history of other diseases of urinary system: Secondary | ICD-10-CM

## 2019-10-23 LAB — URINE CULTURE: Culture: >=100000 — AB

## 2019-10-23 NOTE — Progress Notes (Signed)
10/23/2019 2:30 PM   Grabiela Judie Petit [place] [birth year] [id number]  Referring provider: Hyman Hopes, MD [address] [place],  [place] [address]  Chief Complaint  Patient presents with  . Hydronephrosis    HPI: Brittney Sanders is a 33 year old female with a history of hematuria, history of vesicular reflux and left renal stone who presents today for a follow up.  History of hematuria (high risk) Non-smoker.  CTU in 08/2018 noted the adrenal glands appear normal.  Left renal cortical scarring identified. Stone within the inferior pole collecting system of the left kidney measures 2-3 mm, image 36/2. No kidney mass or hydronephrosis identified bilaterally. The ureters are patent bilaterally. Urinary bladder appears normal. No bladder calculi.   Cystoscopy with Dr. Lonna Cobb in 10/2018 was NED.    History of vesicular reflux VCUG in 12/2015 revealed Vesicoureteral reflux grade 1 -2.  She saw Dr. Marlou Porch in 2017 and no treatment was recommended. RUS in 08/2017 revealed no hydronephrosis.  6 mm left renal calculus.  Since her last visit, she had a CT hematuria workup on 09/29/2018 that showed: Small nonobstructing calculus is identified within the inferior pole of the left kidney. Extensive left kidney scarring with diffuse cortical lobulation  Left renal stone Was seen in the ED on 10/21/2019 for left flank pain, nausea and increased urination the day before.  CT Renal stone noted 6 mm nonobstructive left renal nephrolithiasis. No CT evidence for ureterolithiasis or obstructive uropathy.  UA positive for moderate leukocytes, 0-5 RBC's, > 50 WBC's, 0-5 squamous epithelial cells, mucus present and non squamous epithelial cells present.  Urine culture was positive for pan sensitive E. Coli.  WBC count was 9.0.  Blood cultures negative.  Lactic acid 1.3.  Serum creatinine 0.97.  She was given Keflex, Clindamycin, Zofran and Percocet in the ED.    She states her pain started in her left knee and  then radiated up her left thigh.  She described as a burning sensation.  A few hours later she developed pain in her left side which prompted her to seek treatment in the emergency room.   She says that she was told that she had a bad UTI and that they were afraid her kidney stone was going to move and to follow-up with [place] promptly.   Patient denies any gross hematuria, dysuria or suprapubic/flank pain.  Patient denies any fevers, chills or vomiting.   She also states that her abscess on her mons pubis clearing up with antibiotic.    PMH: Past Medical History:  Diagnosis Date  . Depression   . Eczema   . Hearing deficit   . Heel spur   . Kidney stone   . Left nephrolithiasis   . Nocturia   . Obesity   . Overweight   . Plantar fasciitis   . Seasonal allergies   . UTI (lower urinary tract infection)     Surgical History: Past Surgical History:  Procedure Laterality Date  . TUBAL LIGATION    . TYMPANOSTOMY TUBE PLACEMENT Right     Home Medications:  Allergies as of 10/23/2019      Reactions   Sulfa Antibiotics Hives, Rash   Sulfamethoxazole Rash      Medication List       Accurate as of October 23, 2019  2:30 PM. If you have any questions, ask your nurse or doctor.        cephALEXin 500 MG capsule Commonly known as: KEFLEX Take  1 capsule (500 mg total) by mouth 3 (three) times daily.   clindamycin 300 MG capsule Commonly known as: CLEOCIN Take 1 capsule (300 mg total) by mouth 3 (three) times daily.   Dupixent 300 MG/2ML prefilled syringe Generic drug: dupilumab   famotidine 20 MG tablet Commonly known as: PEPCID Take 1 tablet (20 mg total) by mouth 2 (two) times daily.   fexofenadine 180 MG tablet Commonly known as: ALLEGRA Take 180 mg by mouth daily.   meloxicam 15 MG tablet Commonly known as: MOBIC Take 15 mg by mouth daily.   metoCLOPramide 10 MG tablet Commonly known as: REGLAN Take 1 tablet (10 mg total) by mouth every 6 (six) hours as needed.    naproxen 500 MG tablet Commonly known as: Naprosyn Take 1 tablet (500 mg total) by mouth 2 (two) times daily with a meal.   nortriptyline 10 MG capsule Commonly known as: PAMELOR Increase nortriptyline to 30 mg nightly for two weeks, then increase to 40 mg nightly, refilled.   nystatin powder Commonly known as: MYCOSTATIN/NYSTOP   ondansetron 4 MG disintegrating tablet Commonly known as: Zofran ODT Take 1 tablet (4 mg total) by mouth every 8 (eight) hours as needed for nausea or vomiting.   ondansetron 4 MG tablet Commonly known as: Zofran Take 1 tablet (4 mg total) by mouth every 8 (eight) hours as needed for nausea or vomiting.   oxyCODONE-acetaminophen 5-325 MG tablet Commonly known as: PERCOCET/ROXICET Take 1 tablet by mouth every 4 (four) hours as needed for severe pain.   PARoxetine 40 MG tablet Commonly known as: PAXIL Take 40 mg by mouth 2 (two) times a day.   SUMAtriptan 100 MG tablet Commonly known as: IMITREX Take 100-200 mg by mouth daily as needed.       Allergies:  Allergies  Allergen Reactions  . Sulfa Antibiotics Hives and Rash  . Sulfamethoxazole Rash    Family History: Family History  Problem Relation Age of Onset  . Urolithiasis Maternal Grandmother   . Kidney disease Neg Hx   . Bladder Cancer Neg Hx   . Kidney cancer Neg Hx     Social History:  reports that she has never smoked. She has never used smokeless tobacco. She reports that she does not drink alcohol or use drugs.  ROS: UROLOGY Frequent Urination?: Yes Hard to postpone urination?: No Burning/pain with urination?: No Get up at night to urinate?: No Leakage of urine?: No Urine stream starts and stops?: No Trouble starting stream?: No Do you have to strain to urinate?: No Blood in urine?: No Urinary tract infection?: No Sexually transmitted disease?: No Injury to kidneys or bladder?: No Painful intercourse?: No Weak stream?: No Currently pregnant?: No Vaginal bleeding?:  No Last menstrual period?: n  Gastrointestinal Nausea?: No Vomiting?: No Indigestion/heartburn?: No Diarrhea?: No Constipation?: No  Constitutional Fever: No Night sweats?: No Weight loss?: No Fatigue?: No  Skin Skin rash/lesions?: No Itching?: No  Eyes Blurred vision?: No Double vision?: No  Ears/Nose/Throat Sore throat?: No Sinus problems?: No  Hematologic/Lymphatic Swollen glands?: No Easy bruising?: No  Cardiovascular Leg swelling?: No Chest pain?: No  Respiratory Cough?: No Shortness of breath?: No  Endocrine Excessive thirst?: No  Musculoskeletal Back pain?: No Joint pain?: No  Neurological Headaches?: No Dizziness?: No  Psychologic Depression?: No Anxiety?: No  Physical Exam: BP (!) 147/86   Pulse (!) 103   Ht 5\' 3"  (1.6 m)   Wt 298 lb (135.2 kg)   LMP  (LMP Unknown) Comment:  neg. preg test  BMI 52.79 kg/m   Constitutional:  Well nourished. Alert and oriented, No acute distress. HEENT: [place] AT, moist mucus membranes.  Trachea midline, no masses. Cardiovascular: No clubbing, cyanosis, or edema. Respiratory: Normal respiratory effort, no increased work of breathing. GI: Abdomen is soft, non tender, non distended, no abdominal masses. Liver and spleen not palpable.  No hernias appreciated.  Stool sample for occult testing is not indicated.   GU: No CVA tenderness.  No bladder fullness or masses.  Healing abscess in the right mons.  No tenderness, fluctuation, crepitus or erythema noted.  No expression of purulent drainage when palpated. Neurologic: Grossly intact, no focal deficits, moving all 4 extremities. Psychiatric: Normal mood and affect.   Laboratory Data: Lab Results  Component Value Date   WBC 9.0 10/21/2019   HGB 13.0 10/21/2019   HCT 39.7 10/21/2019   MCV 88.0 10/21/2019   PLT 347 10/21/2019    Lab Results  Component Value Date   CREATININE 0.97 10/21/2019    No results found for: PSA  No results found for:  TESTOSTERONE  No results found for: HGBA1C  No results found for: TSH  No results found for: CHOL, HDL, CHOLHDL, VLDL, LDLCALC  Lab Results  Component Value Date   AST 24 09/09/2018   Lab Results  Component Value Date   ALT 22 09/09/2018   No components found for: ALKALINEPHOPHATASE No components found for: BILIRUBINTOTAL  No results found for: ESTRADIOL  Urinalysis Negative.  See Epic. I have reviewed the labs.  Pertinent imaging CLINICAL DATA:  Initial evaluation for acute left flank pain.  EXAM: CT ABDOMEN AND PELVIS WITHOUT CONTRAST  TECHNIQUE: Multidetector CT imaging of the abdomen and pelvis was performed following the standard protocol without IV contrast.  COMPARISON:  Prior CT from 09/29/2018.  FINDINGS: Lower chest: Mild subsegmental atelectatic changes present dependently within the visualized lung bases. Visualized lungs are otherwise clear.  Hepatobiliary: Mild diffuse hypoattenuation of the liver consistent with steatosis. Liver otherwise unremarkable. Gallbladder within normal limits. No biliary dilatation.  Pancreas: Mild diffuse fatty infiltration the pancreas noted. Pancreas otherwise unremarkable without acute abnormality.  Spleen: Spleen within normal limits.  Adrenals/Urinary Tract: Adrenal glands are normal.  Right kidney within normal limits without nephrolithiasis or hydronephrosis. No radiopaque calculi seen along the course of the right renal collecting system. No right-sided hydroureter.  Multifocal cortical scarring noted about the left kidney. 6 mm nonobstructive calculus present at the lower pole. No obstructive radiopaque calculi seen along the course of the left renal collecting system. No left-sided hydronephrosis or hydroureter.  Partially distended bladder within normal limits. No layering stones seen within the bladder lumen.  Stomach/Bowel: Stomach within normal limits. No evidence for bowel  obstruction. Appendix mildly prominent measuring up to 11 mm in diameter, but demonstrates no associated inflammatory changes to suggest acute appendicitis. This is stable from previous. No acute inflammatory changes seen elsewhere about the bowels.  Vascular/Lymphatic: Intra-abdominal aorta of normal caliber. No adenopathy.  Reproductive: Uterus within normal limits. Left ovary unremarkable. 17 mm right ovarian cyst, likely a normal functional cyst.  Other: No free air or fluid.  Musculoskeletal: No acute osseous finding. No discrete lytic or blastic osseous lesions. Moderate degenerative spondylolysis noted at L5-S1.  IMPRESSION: 1. 6 mm nonobstructive left renal nephrolithiasis. No CT evidence for ureterolithiasis or obstructive uropathy. 2. No other acute intra-abdominal or pelvic process. 3. Hepatic steatosis.   Electronically Signed   By: Jeannine Boga M.D.   On:  10/21/2019 05:25 I have independently reviewed the films and compared to previous CTs dating back to 2018.  The stone has not changed in size or location since then.    Assessment & Plan:    1. History of hematuria Hematuria work up completed in 10/2018- findings positive for 2 mm left renal stone No report of gross hematuria  UA today with 0-5 RBC's in the ED  RTC in one year for UA - patient to report any gross hematuria in the interim    2. History of vesicular reflux No intervention at this time  3. Left renal stone We reviewed CT scans going back to 2018.  The stone has not changed position or size over the last 2 years.  I explained that this was not the cause of her side or knee leg pain  4. UTI Explained to the patient that she may be colonized and not truly have a urinary tract infection.  She will return in 3 weeks for we will catheterize her for UA specimen if she is still experiencing her increase in urinary frequency.  Otherwise, we will check a clean catch to ensure the micro  heme has cleared.   5. Sciatica Patient encouraged to seek treatment with PCP regarding her back and knee pain  Return in about 3 weeks (around 11/13/2019) for follow up for CATH UA and symptoms recheck .  These notes generated with voice recognition software. I apologize for typographical errors.  Michiel CowboySHANNON Carlisha Wisler, PA-C  [facility] [address]  [address] [place], [place] [address] [phone]

## 2019-10-24 NOTE — Progress Notes (Signed)
Brief Pharmacy Note  Patient is a 33 y/o F with hx of kidney stones who presented to [facility] ED 11/21 c/o left flank pain, nausea, increased urination, and leg pain. CT scan with non-obstructing left renal nephrolithiasis. Patient also with tiny drained abscess of upper right mons pubis. Patient was discharged on clindamycin and cephalexin. Urine culture from ED visit has resulted >100k colonies/mL E coli (pan-sensitive). Urology follow-up 11/23 who acknowledged urine culture result in note. She is to return to clinic in 3 weeks for catheterized urine specimen if still experiencing increased urinary frequency. Patient is appropriately covered on discharge antibiotic with follow-up in place.  Floyd Resident 24 October 2019

## 2019-10-24 NOTE — Unmapped (Signed)
[facility] Refill Coordination Note    Specialty Medication(s) to be Shipped:   Inflammatory Disorders: Dupixent    Other medication(s) to be shipped: n/a     Samantha [place], DOB: 08/10/1986  Phone: [phone] (home)       All above HIPAA information was verified with patient.     Completed refill call assessment today to schedule patient's medication shipment from the [facility] [phone]).       Specialty medication(s) and dose(s) confirmed: Regimen is correct and unchanged.   Changes to medications: Charnese reports no changes at this time.  Changes to insurance: No  Questions for the pharmacist: No    Confirmed patient received Welcome Packet with first shipment. The patient will receive a drug information handout for each medication shipped and additional FDA Medication Guides as required.       DISEASE/MEDICATION-SPECIFIC INFORMATION        For patients on injectable medications: Patient currently has 0 doses left.  Next injection is scheduled for 11/01/2019.    SPECIALTY MEDICATION ADHERENCE     Medication Adherence    Patient reported X missed doses in the last month: 0  Specialty Medication: Dupixent 300 mg/2 ml  Patient is on additional specialty medications: No  Any gaps in refill history greater than 2 weeks in the last 3 months: no  Demonstrates understanding of importance of adherence: yes  Informant: patient  Reliability of informant: reliable  Confirmed plan for next specialty medication refill: delivery by pharmacy  Refills needed for supportive medications: not needed                Dupixent 300 mg/2 ml. 0 on hand      SHIPPING     Shipping address confirmed in Epic.     Delivery Scheduled: Yes, Expected medication delivery date: 10/30/2019.     Medication will be delivered via Same Day Courier to the prescription address in Epic WAM.    Francoise Chojnowski D Alitzel Cookson   [facility] Specialty Technician

## 2019-10-26 LAB — CULTURE, BLOOD (ROUTINE X 2)
Culture: NO GROWTH
Culture: NO GROWTH
Special Requests: ADEQUATE

## 2019-10-30 MED FILL — DUPIXENT 300 MG/2 ML SUBCUTANEOUS SYRINGE: SUBCUTANEOUS | 28 days supply | Qty: 4 | Fill #5

## 2019-11-15 ENCOUNTER — Encounter: Payer: Self-pay | Admitting: Physician Assistant

## 2019-11-15 ENCOUNTER — Ambulatory Visit: Payer: Medicare HMO | Admitting: Urology

## 2019-11-15 ENCOUNTER — Ambulatory Visit (INDEPENDENT_AMBULATORY_CARE_PROVIDER_SITE_OTHER): Payer: Medicare HMO | Admitting: Physician Assistant

## 2019-11-15 ENCOUNTER — Other Ambulatory Visit: Payer: Self-pay

## 2019-11-15 VITALS — BP 128/84 | HR 88 | Ht 63.0 in | Wt 298.0 lb

## 2019-11-15 DIAGNOSIS — R300 Dysuria: Secondary | ICD-10-CM | POA: Diagnosis not present

## 2019-11-15 LAB — MICROSCOPIC EXAMINATION: RBC, Urine: NONE SEEN /hpf (ref 0–2)

## 2019-11-15 LAB — URINALYSIS, COMPLETE
Bilirubin, UA: NEGATIVE
Glucose, UA: NEGATIVE
Ketones, UA: NEGATIVE
Nitrite, UA: POSITIVE — AB
Protein,UA: NEGATIVE
RBC, UA: NEGATIVE
Specific Gravity, UA: 1.025 (ref 1.005–1.030)
Urobilinogen, Ur: 0.2 mg/dL (ref 0.2–1.0)
pH, UA: 6.5 (ref 5.0–7.5)

## 2019-11-15 NOTE — Progress Notes (Signed)
In and Out Catheterization  Patient is present today for a I & O catheterization due to dysuria. Patient was cleaned and prepped in a sterile fashion with betadine and Lidocaine 2% jelly was instilled into the urethra.  A 14FR cath was inserted no complications were noted , 43ml of urine return was noted, urine was yellow in color. A clean urine sample was collected for UA specimen. Bladder was drained  And catheter was removed with out difficulty.    Performed by: Verlene Mayer, CMA  Follow up/ Additional notes: Will be notified of results, voiced understanding.

## 2019-11-16 ENCOUNTER — Ambulatory Visit: Payer: Medicare HMO | Admitting: Physician Assistant

## 2019-11-17 ENCOUNTER — Other Ambulatory Visit: Payer: Self-pay | Admitting: Physician Assistant

## 2019-11-17 ENCOUNTER — Telehealth: Payer: Self-pay | Admitting: Physician Assistant

## 2019-11-17 DIAGNOSIS — N3000 Acute cystitis without hematuria: Secondary | ICD-10-CM

## 2019-11-17 MED ORDER — NITROFURANTOIN MONOHYD MACRO 100 MG PO CAPS: 100.0000 mg | ORAL_CAPSULE | Freq: Two times a day (BID) | ORAL | 0 refills | Status: AC

## 2019-11-17 NOTE — Telephone Encounter (Signed)
Spoke to the patient via telephone.  Reiterated message previously described.  She expressed understanding.

## 2019-11-17 NOTE — Telephone Encounter (Signed)
Patient's urine culture positive for E. coli, susceptibilities pending.  I have sent Macrobid 100 mg twice daily x5 days to her pharmacy.  LMOM for patient to return my call.

## 2019-11-17 NOTE — Unmapped (Signed)
[facility] Pharm[facility] Refill Coordination Note    Specialty Medication(s) to be Shipped:   Inflammatory Disorders: Dupixent    Other medication(s) to be shipped: n/a     Samantha [place], DOB: 02-06-1986  Phone: [phone] (home)       All above HIPAA information was verified with patient.     Was a Nurse, learning disability used for this call? No    Completed refill call assessment today to schedule patient's medication shipment from the [facility] [phone]).       Specialty medication(s) and dose(s) confirmed: Regimen is correct and unchanged.   Changes to medications: Samantha [place] reports no changes at this time.  Changes to insurance: No  Questions for the pharmacist: No    Confirmed patient received Welcome Packet with first shipment. The patient will receive a drug information handout for each medication shipped and additional FDA Medication Guides as required.       DISEASE/MEDICATION-SPECIFIC INFORMATION        For patients on injectable medications: Patient currently has 0 doses left.  Next injection is scheduled for 11/29/2019.    SPECIALTY MEDICATION ADHERENCE     Medication Adherence    Patient reported X missed doses in the last month: 0  Specialty Medication: Dupixent 300 mg/2 ml  Patient is on additional specialty medications: No  Any gaps in refill history greater than 2 weeks in the last 3 months: no  Demonstrates understanding of importance of adherence: yes  Informant: patient  Reliability of informant: reliable  Confirmed plan for next specialty medication refill: delivery by pharmacy  Refills needed for supportive medications: not needed                Dupixent 300 mg/2 ml. 0 on hand      SHIPPING     Shipping address confirmed in Epic.     Delivery Scheduled: Yes, Expected medication delivery date: 11/20/2019.     Medication will be delivered via Same Day Courier to the prescription address in Epic WAM.    Samantha [place] [facility] Shared [facility] Specialty Technician

## 2019-11-18 LAB — CULTURE, URINE COMPREHENSIVE

## 2019-11-20 MED FILL — DUPIXENT 300 MG/2 ML SUBCUTANEOUS SYRINGE: 28 days supply | Qty: 4 | Fill #6 | Status: AC

## 2019-11-20 MED FILL — DUPIXENT 300 MG/2 ML SUBCUTANEOUS SYRINGE: SUBCUTANEOUS | 28 days supply | Qty: 4 | Fill #6

## 2019-11-21 ENCOUNTER — Telehealth: Payer: Self-pay | Admitting: Urology

## 2019-11-21 NOTE — Telephone Encounter (Signed)
Spoke to patient and informed her that she is to complete her course of abx. She is wanting to know when she is to come back for a urinalysis recheck. I informed her if she is still having symptoms she can come in for a urine culture.

## 2019-11-21 NOTE — Telephone Encounter (Addendum)
Pt called office with questions about her culture results.

## 2019-12-01 DIAGNOSIS — Z8614 Personal history of Methicillin resistant Staphylococcus aureus infection: Secondary | ICD-10-CM

## 2019-12-01 HISTORY — DX: Personal history of Methicillin resistant Staphylococcus aureus infection: Z86.14

## 2019-12-12 DIAGNOSIS — L209 Atopic dermatitis, unspecified: Principal | ICD-10-CM

## 2019-12-12 DIAGNOSIS — L309 Dermatitis, unspecified: Principal | ICD-10-CM

## 2019-12-12 MED ORDER — DUPIXENT 300 MG/2 ML SUBCUTANEOUS SYRINGE
SUBCUTANEOUS | 1 refills | 28 days | Status: CP
Start: 2019-12-12 — End: 2020-01-09
  Filled 2019-12-21: qty 4, 28d supply, fill #0

## 2019-12-12 NOTE — Unmapped (Signed)
Refill Dupixent for 2 months. Needs appointment for further refills.

## 2019-12-12 NOTE — Unmapped (Signed)
Please schedule appointment within 2 months per Dr. Regino Schultze.

## 2019-12-12 NOTE — Unmapped (Signed)
Called patient to schd an appt had to LVM    Thanks  [facility]

## 2019-12-12 NOTE — Unmapped (Signed)
Last seen July 2020. Requesting dupixent refill. Pended.

## 2019-12-12 NOTE — Unmapped (Signed)
PA initiated in [facility] for DUPIXENT (dupilumab) 300MG /2ML pre-filled syringes.

## 2019-12-19 DIAGNOSIS — L309 Dermatitis, unspecified: Principal | ICD-10-CM

## 2019-12-19 DIAGNOSIS — L209 Atopic dermatitis, unspecified: Principal | ICD-10-CM

## 2019-12-19 NOTE — Unmapped (Signed)
Samantha [place] reports things are going well with her Dupixent. Her skin is clear.    She does report some mild injection site reaction (not itchy, no swelling, but some redness around injection site that lingers a few weeks). I recommended she could try some topical steroid (she has clobetasol) on the area if she wanted the redness to go away more quickly. I recommended she let us/clinic know if this worsens.    I recommended she call clinic for an appointment (2 refills remaining).    [facility] Shared [facility] Clinical Assessment & Refill Coordination Note    Samantha [place], DOB: 02/14/1986  Phone: [phone] (home)     All above HIPAA information was verified with patient.     Was a Nurse, learning disability used for this call? No    Specialty Medication(s):   Inflammatory Disorders: Dupixent     Current Outpatient Medications   Medication Sig Dispense Refill   ??? betamethasone dipropionate (DIPROLENE) 0.05 % ointment betamethasone dipropionate 0.05 % topical ointment     ??? clobetasoL (TEMOVATE) 0.05 % cream Apply topically to rash on hands in the morning until resolved. 60 g 5   ??? clobetasoL (TEMOVATE) 0.05 % ointment Apply to eczema on hands at night until resolved. 60 g 5   ??? dupilumab (DUPIXENT SYRINGE) 300 mg/2 mL Syrg injection Inject 2 mL (300 mg total) under the skin every fourteen (14) days for 28 days. Needs appointment for refills. 4 mL 1   ??? empty container Misc Use as directed to dispose of Dupixent syringes. 1 each 2   ??? fexofenadine (ALLEGRA) 180 MG tablet Take 180 mg by mouth daily.  11   ??? ibuprofen (ADVIL,MOTRIN) 800 MG tablet TAKE 1 TABLET BY MOUTH EVERY 8 HOURS AS NEEDED FOR MODERATE PAIN  0   ??? meloxicam (MOBIC) 15 MG tablet Mobic 15 mg tablet   Take 1 tablet(s) every day by oral route.     ??? nystatin (MYCOSTATIN) powder      ??? ondansetron (ZOFRAN) 4 MG tablet Take 4 mg by mouth.     ??? PARoxetine (PAXIL) 40 MG tablet        No current facility-administered medications for this visit. Changes to medications: Renea reports no changes at this time.    Allergies   Allergen Reactions   ??? Sulfacetamide Sodium Hives and Rash   ??? Sulfamethoxazole Rash       Changes to allergies: No    SPECIALTY MEDICATION ADHERENCE     Dupixent - 0 left  Medication Adherence    Patient reported X missed doses in the last month: 0  Specialty Medication: Dupixent  Patient is on additional specialty medications: No          Specialty medication(s) dose(s) confirmed: Regimen is correct and unchanged.     Are there any concerns with adherence? No    Adherence counseling provided? Not needed    CLINICAL MANAGEMENT AND INTERVENTION      Clinical Benefit Assessment:    Do you feel the medicine is effective or helping your condition? Yes    Clinical Benefit counseling provided? Not needed    Adverse Effects Assessment:    Are you experiencing any side effects? yes, see above - injection site reaction    Are you experiencing difficulty administering your medicine? No    Quality of Life Assessment:    How many days over the past month did your AD  keep you from your normal activities? For example,  brushing your teeth or getting up in the morning. 0    Have you discussed this with your provider? Not needed    Therapy Appropriateness:    Is therapy appropriate? Yes, therapy is appropriate and should be continued    DISEASE/MEDICATION-SPECIFIC INFORMATION      For patients on injectable medications: Patient currently has 0 doses left.  Next injection is scheduled for 1/27.    PATIENT SPECIFIC NEEDS     ? Does the patient have any physical, cognitive, or cultural barriers? No    ? Is the patient high risk? No     ? Does the patient require a Care Management Plan? No     ? Does the patient require physician intervention or other additional services (i.e. nutrition, smoking cessation, social work)? No      SHIPPING     Specialty Medication(s) to be Shipped:   Inflammatory Disorders: Dupixent    Other medication(s) to be shipped: NA Changes to insurance: No    Delivery Scheduled: Yes, Expected medication delivery date: Thurs, Jan 21.     Medication will be delivered via Same Day Courier to the confirmed prescription address in [facility].    The patient will receive a drug information handout for each medication shipped and additional FDA Medication Guides as required.  Verified that patient has previously received a Conservation officer, historic buildings.    All of the patient's questions and concerns have been addressed.    Lanney Gins   [facility] Shared [facility] Specialty Pharmacist

## 2019-12-21 MED FILL — DUPIXENT 300 MG/2 ML SUBCUTANEOUS SYRINGE: 28 days supply | Qty: 4 | Fill #0 | Status: AC

## 2020-01-22 NOTE — Unmapped (Signed)
[facility] Specialty Pharmacy Refill Coordination Note    Specialty Medication(s) to be Shipped:   Inflammatory Disorders: Dupixent    Other medication(s) to be shipped: n/a     Samantha Miles, DOB: 1986/09/04  Phone: [phone] (home)       All above HIPAA information was verified with patient.     Was a Nurse, learning disability used for this call? No    Completed refill call assessment today to schedule patient's medication shipment from the [facility] [phone]).       Specialty medication(s) and dose(s) confirmed: Regimen is correct and unchanged.   Changes to medications: Shantay reports no changes at this time.  Changes to insurance: No  Questions for the pharmacist: No    Confirmed patient received Welcome Packet with first shipment. The patient will receive a drug information handout for each medication shipped and additional FDA Medication Guides as required.       DISEASE/MEDICATION-SPECIFIC INFORMATION        For patients on injectable medications: Patient currently has 0 doses left.  Next injection is scheduled for 01/24/2020.    SPECIALTY MEDICATION ADHERENCE     Medication Adherence    Patient reported X missed doses in the last month: 0  Specialty Medication: Dupixent 300 mg/2 ml  Patient is on additional specialty medications: No  Any gaps in refill history greater than 2 weeks in the last 3 months: no  Demonstrates understanding of importance of adherence: yes  Informant: patient  Reliability of informant: reliable  Confirmed plan for next specialty medication refill: delivery by pharmacy  Refills needed for supportive medications: not needed                Dupixent 300 mg/2 ml. 0 on hand      SHIPPING     Shipping address confirmed in Epic.     Delivery Scheduled: Yes, Expected medication delivery date: 01/23/2020.     Medication will be delivered via Same Day Courier to the prescription address in Epic WAM.    Xzavion Doswell D Sheika Coutts   [facility] Specialty Technician

## 2020-01-23 MED FILL — DUPIXENT 300 MG/2 ML SUBCUTANEOUS SYRINGE: 28 days supply | Qty: 4 | Fill #1 | Status: AC

## 2020-01-23 MED FILL — DUPIXENT 300 MG/2 ML SUBCUTANEOUS SYRINGE: SUBCUTANEOUS | 28 days supply | Qty: 4 | Fill #1

## 2020-02-14 NOTE — Unmapped (Signed)
Called and spoke to patient to inform her that she needs appointment for further refills of Dupixent. She stated she would give the office a call in a few . Rescheduling call to follow up

## 2020-02-22 ENCOUNTER — Ambulatory Visit: Admit: 2020-02-22 | Discharge: 2020-02-23 | Payer: MEDICARE

## 2020-02-22 DIAGNOSIS — B372 Candidiasis of skin and nail: Principal | ICD-10-CM

## 2020-02-22 DIAGNOSIS — L209 Atopic dermatitis, unspecified: Principal | ICD-10-CM

## 2020-02-22 DIAGNOSIS — L309 Dermatitis, unspecified: Principal | ICD-10-CM

## 2020-02-22 MED ORDER — CLOBETASOL 0.05 % TOPICAL OINTMENT
5 refills | 0 days | Status: CP
Start: 2020-02-22 — End: ?

## 2020-02-22 MED ORDER — DUPIXENT 300 MG/2 ML SUBCUTANEOUS SYRINGE
SUBCUTANEOUS | 1 refills | 28.00000 days | Status: CP
Start: 2020-02-22 — End: 2020-03-21
  Filled 2020-03-06: qty 4, 28d supply, fill #0

## 2020-02-22 MED ORDER — NYSTATIN 100,000 UNIT/GRAM TOPICAL OINTMENT
Freq: Two times a day (BID) | TOPICAL | 5 refills | 0 days | Status: CP
Start: 2020-02-22 — End: ?

## 2020-02-22 NOTE — Unmapped (Signed)
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 However given clinical suspicion for EIB, recommended starting vinegar soaks and nystatin ointment BID  - Continue Dupixent 300mg  subcutaneous every other week, refilled  - Continue Clobetasol 0.05% ointment QHS. Refilled today.  - Continue frequent emollient use throughout the day and after hand washing.  - Reiterated using protective gloves when washing dishes or cleaning with chemicals.   - Consider biopsying rash on feet if not improved at next visit. Consider addition of methotrexate if rash still flaring despite Dupixent.    Return to clinic: Return in about 2 months (around 04/23/2020).   ___________________________________________________________________    CHIEF COMPLAINT:  Hand eczema f/u    HPI:   Samantha Miles is a 34 y.o. female last seen by Dr. Aida Raider in 05/2019 for eczema. At this visit, she was started on dupixent. She presents today for follow-up.    Reports rash overall improved, however still with flares requiring clobetasol ~ every other day. Feet worse than hands. Feet are often itchy. Tolerating Dupixent well w/o side effects such as injection site pain or conjunctivitis. Hands tend to flare with dishwashing.    No other skin concerns today.      PAST MEDICAL HISTORY:  Depression  Overweight  Low back pain  H/o atopic dermatitis as a child     MEDICATIONS:   Reviewed in Epic.     ALLERGIES:   Sulfacetamide sodium and Sulfamethoxazole    SOCIAL HISTORY:  2 kids    REVIEW OF SYSTEMS:  No recent fever, chills, recent illnesses, or other skin concerns except per HPI      PHYSICAL EXAMINATION:  General: WDWN, NAD  Neuro: A&O, answers questions appropriately.  Skin: Examination with inspection and palpation of the BUE, BLE was notable for:  - Few desquamative collarettes of scale on bl palmar hands  - Erythematous scaly plaques on dorsal and lateral aspects of feet and interdigital maceration seen in several interdigital web spaces of toes. +malodor.  -All other areas not specifically commented on are within normal limits.    The patient was seen and examined by Inis Sizer, MD PhD who agrees with the assessment and plan as above.

## 2020-02-22 NOTE — Unmapped (Signed)
Vinegar Soaks    Please add 1 cup of white vinegar to a quart of room temperature water.  Soak affected area for 10-20 minutes once or twice a day.

## 2020-03-06 MED FILL — DUPIXENT 300 MG/2 ML SUBCUTANEOUS SYRINGE: 28 days supply | Qty: 4 | Fill #0 | Status: AC

## 2020-03-06 NOTE — Unmapped (Signed)
[facility] Specialty Pharmacy Refill Coordination Note    Specialty Medication(s) to be Shipped:   Inflammatory Disorders: Dupixent    Other medication(s) to be shipped: n/a     Samantha Miles, DOB: [birth year]  Phone: [phone] (home)       All above HIPAA information was verified with patient.     Was a Nurse, learning disability used for this call? No    Completed refill call assessment today to schedule patient's medication shipment from the [facility] [phone]).       Specialty medication(s) and dose(s) confirmed: Regimen is correct and unchanged.   Changes to medications: Larita reports no changes at this time.  Changes to insurance: No  Questions for the pharmacist: No    Confirmed patient received Welcome Packet with first shipment. The patient will receive a drug information handout for each medication shipped and additional FDA Medication Guides as required.       DISEASE/MEDICATION-SPECIFIC INFORMATION        For patients on injectable medications: Patient currently has 0 doses left.  Next injection is scheduled for 03/06/2020.    SPECIALTY MEDICATION ADHERENCE     Medication Adherence    Patient reported X missed doses in the last month: 1  Specialty Medication: Dupixent 300 mg/2 ml-pt had to be seen in clinic before refills were granted  Patient is on additional specialty medications: No  Any gaps in refill history greater than 2 weeks in the last 3 months: no  Demonstrates understanding of importance of adherence: yes  Informant: patient  Reliability of informant: reliable  Confirmed plan for next specialty medication refill: delivery by pharmacy  Refills needed for supportive medications: not needed                Dupixent 300 mg/2 ml . 0 on hand      SHIPPING     Shipping address confirmed in Epic.     Delivery Scheduled: Yes, Expected medication delivery date: 03/06/2020.     Medication will be delivered via Same Day Courier to the prescription address in Epic WAM.    Samantha Miles [facility] Specialty Technician

## 2020-03-25 NOTE — Unmapped (Addendum)
[facility] Specialty Pharmacy Refill Coordination Note    Patient informed that she needs an appointment for further refills    Specialty Medication(s) to be Shipped:   Inflammatory Disorders: Dupixent    Other medication(s) to be shipped: n/a     Samantha Miles, DOB: 01/27/1986  Phone: [phone] (home)       All above HIPAA information was verified with patient.     Was a Nurse, learning disability used for this call? No    Completed refill call assessment today to schedule patient's medication shipment from the [facility] [phone]).       Specialty medication(s) and dose(s) confirmed: Regimen is correct and unchanged.   Changes to medications: Samantha Miles reports no changes at this time.  Changes to insurance: No  Questions for the pharmacist: No    Confirmed patient received Welcome Packet with first shipment. The patient will receive a drug information handout for each medication shipped and additional FDA Medication Guides as required.       DISEASE/MEDICATION-SPECIFIC INFORMATION        For patients on injectable medications: Patient currently has 0 doses left.  Next injection is scheduled for 04/03/2020.    SPECIALTY MEDICATION ADHERENCE     Medication Adherence    Patient reported X missed doses in the last month: 0  Specialty Medication: Dupixent 300 mg/2 ml  Patient is on additional specialty medications: No  Any gaps in refill history greater than 2 weeks in the last 3 months: no  Demonstrates understanding of importance of adherence: yes  Informant: patient  Reliability of informant: reliable  Confirmed plan for next specialty medication refill: delivery by pharmacy  Refills needed for supportive medications: not needed                Dupixent 300 mg/2 ml . 0 on hand      SHIPPING     Shipping address confirmed in Epic.     Delivery Scheduled: Yes, Expected medication delivery date: 03/29/2020.     Medication will be delivered via Same Day Courier to the prescription address in Epic WAM. Samantha Miles D Sharmon Cheramie   [facility] Specialty Technician

## 2020-03-29 MED FILL — DUPIXENT 300 MG/2 ML SUBCUTANEOUS SYRINGE: 28 days supply | Qty: 4 | Fill #1 | Status: AC

## 2020-03-29 MED FILL — DUPIXENT 300 MG/2 ML SUBCUTANEOUS SYRINGE: SUBCUTANEOUS | 28 days supply | Qty: 4 | Fill #1

## 2020-04-04 MED ORDER — TRUEPLUS PEN NEEDLE 31 GAUGE X 1/4" (6 MM)
0 days
Start: 2020-04-04 — End: ?

## 2020-04-19 DIAGNOSIS — L309 Dermatitis, unspecified: Principal | ICD-10-CM

## 2020-04-19 DIAGNOSIS — L209 Atopic dermatitis, unspecified: Principal | ICD-10-CM

## 2020-04-19 MED ORDER — DUPIXENT 300 MG/2 ML SUBCUTANEOUS SYRINGE
SUBCUTANEOUS | 1 refills | 28 days | Status: CP
Start: 2020-04-19 — End: 2020-05-17
  Filled 2020-05-01: qty 4, 28d supply, fill #0

## 2020-04-19 NOTE — Unmapped (Signed)
Patient last seen on 02/22/20. Please advise, Thanks!

## 2020-04-22 NOTE — Unmapped (Signed)
[facility] Special[facility] Pharmacy Refill Coordination Note    Patient informed that she needs an appointment for further refills    Specialty Medication(s) to be Shipped:   Inflammatory Disorders: Dupixent    Other medication(s) to be shipped: n/a     Jaidynn Tenna Delaine, DOB: [birth year]  Phone: [phone] (home)       All above HIPAA information was verified with patient.     Was a Nurse, learning disability used for this call? No    Completed refill call assessment today to schedule patient's medication shipment from the [facility] [phone]).       Specialty medication(s) and dose(s) confirmed: Regimen is correct and unchanged.   Changes to medications: Sanii reports no changes at this time.  Changes to insurance: No  Questions for the pharmacist: No    Confirmed patient received Welcome Packet with first shipment. The patient will receive a drug information handout for each medication shipped and additional FDA Medication Guides as required.       DISEASE/MEDICATION-SPECIFIC INFORMATION        For patients on injectable medications: Patient currently has 1 doses left.  Next injection is scheduled for 04/24/2020.    SPECIALTY MEDICATION ADHERENCE     Medication Adherence    Patient reported X missed doses in the last month: 0  Specialty Medication: Dupixent 300 mg/2 ml  Patient is on additional specialty medications: No  Any gaps in refill history greater than 2 weeks in the last 3 months: no  Demonstrates understanding of importance of adherence: yes  Informant: patient  Reliability of informant: reliable  Confirmed plan for next specialty medication refill: delivery by pharmacy  Refills needed for supportive medications: not needed                Dupixent 300 mg/2 ml . 14 days on hand      SHIPPING     Shipping address confirmed in Epic.     Delivery Scheduled: Yes, Expected medication delivery date: 05/01/2020.     Medication will be delivered via Same Day Courier to the prescription address in Epic WAM. Deundra Bard D Mateo Overbeck   [facility] Specialty Technician

## 2020-05-01 MED FILL — DUPIXENT 300 MG/2 ML SUBCUTANEOUS SYRINGE: 28 days supply | Qty: 4 | Fill #0 | Status: AC

## 2020-05-23 NOTE — Unmapped (Signed)
Samantha Miles reports things are going well with her Dupixent. She is aware she needs an appt and will call today to schedule. She denies eye irritation or other adverse effects from Dupixent. She has not needed topical steroids.      I asked about her feet - they are improved, but not 100%. She reports less itching, but still some redness that is present.    [facility] Special[facility] Pharmacy Clinical Assessment & Refill Coordination Note    Samantha Miles, DOB: [birth year]  Phone: [phone] (home)     All above HIPAA information was verified with patient.     Was a Nurse, learning disability used for this call? No    Specialty Medication(s):   Inflammatory Disorders: Dupixent     Current Outpatient Medications   Medication Sig Dispense Refill   ??? betamethasone dipropionate (DIPROLENE) 0.05 % ointment betamethasone dipropionate 0.05 % topical ointment     ??? clobetasoL (TEMOVATE) 0.05 % cream Apply topically to rash on hands in the morning until resolved. 60 g 5   ??? clobetasoL (TEMOVATE) 0.05 % ointment Apply to eczema on hands at night until resolved. 60 g 5   ??? dupilumab (DUPIXENT SYRINGE) 300 mg/2 mL Syrg injection Inject the contents of 1 syringe (300 mg total) under the skin every fourteen (14) days for 28 days. Needs appointment for refills. 4 mL 1   ??? empty container Misc Use as directed to dispose of Dupixent syringes. 1 each 2   ??? fexofenadine (ALLEGRA) 180 MG tablet Take 180 mg by mouth daily.  11   ??? ibuprofen (ADVIL,MOTRIN) 800 MG tablet TAKE 1 TABLET BY MOUTH EVERY 8 HOURS AS NEEDED FOR MODERATE PAIN  0   ??? meloxicam (MOBIC) 15 MG tablet Mobic 15 mg tablet   Take 1 tablet(s) every day by oral route.     ??? nystatin (MYCOSTATIN) 100,000 unit/gram ointment Apply 1 application topically Two (2) times a day. To scaly areas on feet and between toes 30 g 5   ??? nystatin (MYCOSTATIN) powder      ??? ondansetron (ZOFRAN) 4 MG tablet Take 4 mg by mouth.     ??? PARoxetine (PAXIL) 40 MG tablet        No current facility-administered medications for this visit.        Changes to medications: Samantha Miles reports no changes at this time.    Allergies   Allergen Reactions   ??? Sulfacetamide Sodium Hives and Rash   ??? Sulfamethoxazole Rash       Changes to allergies: No    SPECIALTY MEDICATION ADHERENCE     Dupixent - 1 left  Medication Adherence    Patient reported X missed doses in the last month: 0  Specialty Medication: Dupixent  Patient is on additional specialty medications: No          Specialty medication(s) dose(s) confirmed: Regimen is correct and unchanged.     Are there any concerns with adherence? No    Adherence counseling provided? Not needed    CLINICAL MANAGEMENT AND INTERVENTION      Clinical Benefit Assessment:    Do you feel the medicine is effective or helping your condition? Yes    Clinical Benefit counseling provided? Not needed    Adverse Effects Assessment:    Are you experiencing any side effects? No    Are you experiencing difficulty administering your medicine? No    Quality of Life Assessment:    How many days over the past month  did your AD  keep you from your normal activities? For example, brushing your teeth or getting up in the morning. 0    Have you discussed this with your provider? Not needed    Therapy Appropriateness:    Is therapy appropriate? Yes, therapy is appropriate and should be continued    DISEASE/MEDICATION-SPECIFIC INFORMATION      For patients on injectable medications: Patient currently has 1 doses left.  Next injection is scheduled for June 30.    PATIENT SPECIFIC NEEDS     - Does the patient have any physical, cognitive, or cultural barriers? No    - Is the patient high risk? No     - Does the patient require a Care Management Plan? No     - Does the patient require physician intervention or other additional services (i.e. nutrition, smoking cessation, social work)? No      SHIPPING     Specialty Medication(s) to be Shipped:   Inflammatory Disorders: Dupixent    Other medication(s) to be shipped: na     Changes to insurance: No    Delivery Scheduled: Yes, Expected medication delivery date: Wed, June 30.     Medication will be delivered via Same Day Courier to the confirmed prescription address in [facility].    The patient will receive a drug information handout for each medication shipped and additional FDA Medication Guides as required.  Verified that patient has previously received a Conservation officer, historic buildings.    All of the patient's questions and concerns have been addressed.    Lanney Gins   [facility] Specialty Pharmacist

## 2020-05-29 MED FILL — DUPIXENT 300 MG/2 ML SUBCUTANEOUS SYRINGE: 28 days supply | Qty: 4 | Fill #1 | Status: AC

## 2020-05-29 MED FILL — DUPIXENT 300 MG/2 ML SUBCUTANEOUS SYRINGE: SUBCUTANEOUS | 28 days supply | Qty: 4 | Fill #1

## 2020-06-07 ENCOUNTER — Ambulatory Visit: Payer: Medicare HMO | Admitting: Urology

## 2020-06-17 DIAGNOSIS — L309 Dermatitis, unspecified: Principal | ICD-10-CM

## 2020-06-17 DIAGNOSIS — L209 Atopic dermatitis, unspecified: Principal | ICD-10-CM

## 2020-06-17 MED ORDER — DUPIXENT 300 MG/2 ML SUBCUTANEOUS SYRINGE
SUBCUTANEOUS | 1 refills | 28.00000 days
Start: 2020-06-17 — End: 2020-07-15

## 2020-06-17 MED ORDER — VICTOZA 2-PAK 0.6 MG/0.1 ML (18 MG/3 ML) SUBCUTANEOUS PEN INJECTOR
0 days
Start: 2020-06-17 — End: ?

## 2020-06-19 DIAGNOSIS — L209 Atopic dermatitis, unspecified: Principal | ICD-10-CM

## 2020-06-19 DIAGNOSIS — L309 Dermatitis, unspecified: Principal | ICD-10-CM

## 2020-06-19 MED ORDER — DUPIXENT 300 MG/2 ML SUBCUTANEOUS SYRINGE
SUBCUTANEOUS | 1 refills | 28 days | Status: CP
Start: 2020-06-19 — End: ?
  Filled 2020-06-20: qty 4, 28d supply, fill #0

## 2020-06-19 NOTE — Unmapped (Signed)
Opened in error

## 2020-06-19 NOTE — Unmapped (Signed)
[facility] Specialty Pharmacy Refill Coordination Note    Patient has appointment on 8/12 for further refills. She missed her 7/17 dose due to Dupixent syringe becoming too warm. I informed her if that happens again that Dupixent is good for 14 days after getting to room temperature.    Specialty Medication(s) to be Shipped:   Inflammatory Disorders: Dupixent    Other medication(s) to be shipped: n/a     Samantha Miles, DOB: [birth year]  Phone: [phone] (home)       All above HIPAA information was verified with patient.     Was a Nurse, learning disability used for this call? No    Completed refill call assessment today to schedule patient's medication shipment from the [facility] [phone]).       Specialty medication(s) and dose(s) confirmed: Regimen is correct and unchanged.   Changes to medications: Samantha Miles reports no changes at this time.  Changes to insurance: No  Questions for the pharmacist: No    Confirmed patient received Welcome Packet with first shipment. The patient will receive a drug information handout for each medication shipped and additional FDA Medication Guides as required.       DISEASE/MEDICATION-SPECIFIC INFORMATION        For patients on injectable medications: Patient currently has 0 doses left.  Next injection is scheduled for asap. overdue was due on 7/17 but patient threw pen away b/c it became warm. .    SPECIALTY MEDICATION ADHERENCE     Medication Adherence    Patient reported X missed doses in the last month: 1  Specialty Medication: Dupixent 300 mg/2 ml  Patient is on additional specialty medications: No  Any gaps in refill history greater than 2 weeks in the last 3 months: no  Demonstrates understanding of importance of adherence: yes  Informant: patient  Reliability of informant: reliable  Confirmed plan for next specialty medication refill: delivery by pharmacy  Refills needed for supportive medications: not needed                Dupixent 300 mg/2 ml . 0 days on hand SHIPPING     Shipping address confirmed in Epic.     Delivery Scheduled: Yes, Expected medication delivery date: 06/20/2020.     Medication will be delivered via Same Day Courier to the prescription address in Epic WAM.    Samantha Miles Samantha Miles   [facility] Specialty Technician

## 2020-06-20 MED FILL — DUPIXENT 300 MG/2 ML SUBCUTANEOUS SYRINGE: 28 days supply | Qty: 4 | Fill #0 | Status: AC

## 2020-07-11 ENCOUNTER — Ambulatory Visit
Admit: 2020-07-11 | Discharge: 2020-07-12 | Payer: MEDICARE | Attending: Student in an Organized Health Care Education/Training Program | Primary: Student in an Organized Health Care Education/Training Program

## 2020-07-11 DIAGNOSIS — L309 Dermatitis, unspecified: Principal | ICD-10-CM

## 2020-07-11 DIAGNOSIS — L209 Atopic dermatitis, unspecified: Principal | ICD-10-CM

## 2020-07-11 MED ORDER — DUPIXENT 300 MG/2 ML SUBCUTANEOUS SYRINGE
SUBCUTANEOUS | 5 refills | 28.00000 days | Status: CP
Start: 2020-07-11 — End: ?
  Filled 2020-07-19: qty 4, 28d supply, fill #0

## 2020-07-11 MED ORDER — BETAMETHASONE, AUGMENTED 0.05 % TOPICAL CREAM
5 refills | 0 days | Status: CP
Start: 2020-07-11 — End: ?

## 2020-07-11 NOTE — Unmapped (Signed)
For the active areas of eczema, I would like for you to start applying betamethasone twice a day until flat and smooth.    Basic Skin Care  Your skin plays an important role in keeping the entire body healthy. Below are some tips on how to try and maximize skin health from the outside in.  1. Bathe in mildly warm water every 1 to 2 days, followed by light drying and an application of a thick moisturizer cream or ointment, preferably one that comes in a tub.  a. Fragrance free moisturizing bars or body washes are preferred such as Purpose, Cetaphil, Dove sensitive skin, Aveeno, or Vanicream products.  b. Use a fragrance free cream or ointment, not a lotion, such as plain petroleum jelly or Vaseline ointment, Aquaphor, Vanicream, Eucerin cream or a generic version, CeraVe Cream, Cetaphil Restoraderm, Aveeno Eczema Therapy and [employer], among others.  c. People with very dry skin often need to put on these creams two, three or four times a day. As much as possible, use these creams enough to keep the skin from looking dry.  d. Consider using fragrance free/dye free detergent, such as Arm and Hammer for sensitive skin, Tide Free or All Free.  2. If I am prescribing a medication to go on the skin, the medicine goes on first to the areas that need it, followed by a thick cream as above to the entire body.  3. Wynelle Link is a major cause of damage to the skin.  a. I recommend sun protection for all of my patients. I prefer physical barriers such as hats with wide brims that cover the ears, long sleeve clothing with SPF protection including rash guards for swimming. These can be found seasonally at outdoor clothing companies, [employer] and [employer] and online at Liz Cla[employer]orne.com, www.uvskinz.com and BrideEmporium.nl. Avoid peak sun between the hours of 10am to 3pm to minimize sun exposure.  b. I recommend sunscreen for all of my patients older than 84 months of age when in the sun, preferably with broad spectrum coverage and SPF 30 or higher.  i. For sensitive skin: I recommend sunscreens that only contain titanium dioxide and/or zinc oxide in the active ingredients. These do not burn the eyes and appear to be safer than chemical sunscreens. Products includ Vanicream Broad Spectrum 50+, Aveeno Natural Mineral Protection, Neutrogena Pure and Free Baby, [employer] Daily face and body lotion, and EltaMD.  ii. There is no such thing as waterproof sunscreen. All sunscreens should be reapplied after 60-80 minutes of wear.  Iii. For those that prefer not to use cream-based sunscreen, there are alternatives such as SPRAYS and STICKS. These are also good options if you are sweaty or working out. Spray on sunscreens often use chemical sunscreens which do protect against the sun. However, these can be difficult to apply correctly, especially if wind is present, and can be more likely to irritate the skin. I recommend applying it at least twice over the involved areas. For the stick, I recommend Neutrogena. It looks like a deodorant stick. It must be applied 4 times over the same area to protect as much as the cream.

## 2020-07-11 NOTE — Unmapped (Addendum)
Dermatology Note     Assessment and Plan:        Atopic dermatitis, unspecified type, chronic, improved but not at goal  - Overall significantly improved on dupilumab therapy.  Does have persistent areas of flaring on forearms and hands.  Patient reports she missed a dose of dupilumab due to not having refrigerator  - dupilumab (DUPIXENT SYRINGE) 300 mg/2 mL Syrg injection; Inject the contents of 1 syringe (300 mg total) under the skin every fourteen (14) days.  - betamethasone, augmented, (DIPROLENE) 0.05 % cream; Apply twice daily to areas of eczema until flat and smooth. Avoid face and skin folds  -We discussed the importance of gentle skin care.  Handout was provided on recommendations products  -We also discussed the reports of cutaneous T-cell lymphoma and dupilumab therapy.  We discussed the importance of monitoring and will plan for a skin check in 6 months.        The patient was advised to call for an appointment should any new, changing, or symptomatic lesions develop.     RTC: Return in about 6 months (around 01/11/2021) for Skin Check on Dupilumab. or sooner as needed   _________________________________________________________________      Chief Complaint     Chief Complaint   Patient presents with   ??? Follow-up     f/u for eczema, she needs refill on Dupixent       HPI     Ashelynn M. Miles is a 34 y.o. female who presents as a returning patient (last seen 02/22/2020) to [facility] for atopic dermatitis.  Since last visit, patient has been taking to [facility] every 2 weeks.  She reports overall significant improvement in her eczema.  She does have persistent involvement of her hands and forearms.  She believes that this is under better control but she has missed a few doses due to not having refrigerator at home and be dispensed limited amounts of medication from the pharmacy.  She continues to use nystatin on certain areas but does not feel like this is overall helpful.  Denies any rashes elsewhere on the body. Denies cough, fevers, chills or recent illness    The patient denies any other new or changing lesions or areas of concern.     Pertinent Past Medical History     No history of skin cancer    Problem List     None          Family History:   Negative for melanoma    Past Medical History, Family History, Social History, Medication List, Allergies, and Problem List were reviewed in the rooming section of Epic.     ROS: Other than symptoms mentioned in the HPI, no fevers, chills, or other skin complaints    Physical Examination     GENERAL: Well-appearing female in no acute distress, resting comfortably.  NEURO: Alert and oriented, answers questions appropriately  SKIN (Sun exposed Exam): Per patient request, examination of the face, neck, scalp, bilateral upper extremities, palms, and fingernails, feet was performed  eczematous patches and plaques of hands, dorsal and ventral forearms  Hyperlinear palms  Xerosis of hand and feet    All areas not commented on are within normal limits or unremarkable      (Approved Template 07/10/2020)

## 2020-07-17 NOTE — Unmapped (Signed)
[facility] Refill Coordination Note    Specialty Medication(s) to be Shipped:   Inflammatory Disorders: Dupixent    Other medication(s) to be shipped: No additional medications requested for fill at this time     Samantha Miles, DOB: 10-01-1986  Phone: [phone] (home)       All above HIPAA information was verified with patient.     Was a Nurse, learning disability used for this call? No    Completed refill call assessment today to schedule patient's medication shipment from the [facility] [phone]).       Specialty medication(s) and dose(s) confirmed: Regimen is correct and unchanged.   Changes to medications: Samantha Miles reports no changes at this time.  Changes to insurance: No  Questions for the pharmacist: No    Confirmed patient received Welcome Packet with first shipment. The patient will receive a drug information handout for each medication shipped and additional FDA Medication Guides as required.       DISEASE/MEDICATION-SPECIFIC INFORMATION        For patients on injectable medications: Patient currently has 0 doses left.  Next injection is scheduled for 07/22/20.    SPECIALTY MEDICATION ADHERENCE     Medication Adherence    Patient reported X missed doses in the last month: 0  Specialty Medication: Dupixent 300 mg/2 ml   Patient is on additional specialty medications: No                DUPIXENT: 0 doses on hand      SHIPPING     Shipping address confirmed in Epic.     Delivery Scheduled: Yes, Expected medication delivery date: 07/19/20.     Medication will be delivered via Same Day Courier to the prescription address in Epic WAM.    Samantha Miles   [facility] Specialty Technician

## 2020-07-19 MED FILL — DUPIXENT 300 MG/2 ML SUBCUTANEOUS SYRINGE: 28 days supply | Qty: 4 | Fill #0 | Status: AC

## 2020-08-15 NOTE — Unmapped (Signed)
[facility] Pharm[facility] Refill Coordination Note    Specialty Medication(s) to be Shipped:   Inflammatory Disorders: Dupixent    Other medication(s) to be shipped: No additional medications requested for fill at this time     Stephan Draughn, DOB: 09/03/1986  Phone: [phone] (home)       All above HIPAA information was verified with patient.     Was a Nurse, learning disability used for this call? No    Completed refill call assessment today to schedule patient's medication shipment from the [facility] [phone]).       Specialty medication(s) and dose(s) confirmed: Regimen is correct and unchanged.   Changes to medications: Marisue reports no changes at this time.  Changes to insurance: No  Questions for the pharmacist: No    Confirmed patient received Welcome Packet with first shipment. The patient will receive a drug information handout for each medication shipped and additional FDA Medication Guides as required.       DISEASE/MEDICATION-SPECIFIC INFORMATION        For patients on injectable medications: Patient currently has 0 doses left.  Next injection is scheduled for 08/19/2020.    SPECIALTY MEDICATION ADHERENCE     Medication Adherence    Patient reported X missed doses in the last month: 0  Specialty Medication: Dupixent 300 mg/2 ml  Patient is on additional specialty medications: No  Any gaps in refill history greater than 2 weeks in the last 3 months: no  Demonstrates understanding of importance of adherence: yes  Informant: patient  Reliability of informant: reliable  Confirmed plan for next specialty medication refill: delivery by pharmacy  Refills needed for supportive medications: not needed                DUPIXENT: 0 doses on hand      SHIPPING     Shipping address confirmed in Epic.     Delivery Scheduled: Yes, Expected medication delivery date: 08/16/2020.     Medication will be delivered via Same Day Courier to the prescription address in Epic WAM.    Vielka Klinedinst D Gelila Well   [facility] Specialty Technician

## 2020-08-16 MED FILL — DUPIXENT 300 MG/2 ML SUBCUTANEOUS SYRINGE: 28 days supply | Qty: 4 | Fill #1 | Status: AC

## 2020-08-16 MED FILL — DUPIXENT 300 MG/2 ML SUBCUTANEOUS SYRINGE: SUBCUTANEOUS | 28 days supply | Qty: 4 | Fill #1

## 2020-09-06 NOTE — Unmapped (Signed)
[facility] Refill Coordination Note    Specialty Medication(s) to be Shipped:   Inflammatory Disorders: Dupixent    Other medication(s) to be shipped: No additional medications requested for fill at this time     Samantha Miles, DOB: 09/12/1986  Phone: [phone] (home)       All above HIPAA information was verified with patient.     Was a Nurse, learning disability used for this call? No    Completed refill call assessment today to schedule patient's medication shipment from the [facility] [phone]).       Specialty medication(s) and dose(s) confirmed: Regimen is correct and unchanged.   Changes to medications: Samantha Miles reports no changes at this time.  Changes to insurance: No  Questions for the pharmacist: No    Confirmed patient received Welcome Packet with first shipment. The patient will receive a drug information handout for each medication shipped and additional FDA Medication Guides as required.       DISEASE/MEDICATION-SPECIFIC INFORMATION        For patients on injectable medications: Patient currently has 1 doses left.  Next injection is scheduled for 10/13.    SPECIALTY MEDICATION ADHERENCE     Medication Adherence    Patient reported X missed doses in the last month: 0  Specialty Medication: Dupixent 300 mg/2 ml   Patient is on additional specialty medications: No  Patient is on more than two specialty medications: No  Any gaps in refill history greater than 2 weeks in the last 3 months: no  Demonstrates understanding of importance of adherence: yes  Informant: patient                Dupixent 300mg /67ml: Patient has 14 days of medication on hand      SHIPPING     Shipping address confirmed in Epic.     Delivery Scheduled: Yes, Expected medication delivery date: 10/15.     Medication will be delivered via Same Day Courier to the prescription address in Epic WAM.    Olga Millers   [facility] Specialty Technician

## 2020-09-13 MED FILL — DUPIXENT 300 MG/2 ML SUBCUTANEOUS SYRINGE: 28 days supply | Qty: 4 | Fill #2 | Status: AC

## 2020-09-13 MED FILL — DUPIXENT 300 MG/2 ML SUBCUTANEOUS SYRINGE: SUBCUTANEOUS | 28 days supply | Qty: 4 | Fill #2

## 2020-10-04 NOTE — Unmapped (Signed)
[facility] Refill Coordination Note    Specialty Medication(s) to be Shipped:   Inflammatory Disorders: Dupixent    Other medication(s) to be shipped: No additional medications requested for fill at this time     Samantha Miles, DOB: [birth year]  Phone: [phone] (home)       All above HIPAA information was verified with patient.     Was a Nurse, learning disability used for this call? No    Completed refill call assessment today to schedule patient's medication shipment from the [facility] [phone]).       Specialty medication(s) and dose(s) confirmed: Regimen is correct and unchanged.   Changes to medications: Samantha Miles reports no changes at this time.  Changes to insurance: No  Questions for the pharmacist: No    Confirmed patient received Welcome Packet with first shipment. The patient will receive a drug information handout for each medication shipped and additional FDA Medication Guides as required.       DISEASE/MEDICATION-SPECIFIC INFORMATION        For patients on injectable medications: Patient currently has 1 doses left.  Next injection is scheduled for 10/09/2020.    SPECIALTY MEDICATION ADHERENCE     Medication Adherence    Patient reported X missed doses in the last month: 0  Specialty Medication: Dupixent 300 mg/2 ml  Patient is on additional specialty medications: No  Any gaps in refill history greater than 2 weeks in the last 3 months: no  Demonstrates understanding of importance of adherence: yes  Informant: patient  Reliability of informant: reliable  Confirmed plan for next specialty medication refill: delivery by pharmacy  Refills needed for supportive medications: not needed                Dupixent 300mg /63ml: Patient has 14 days of medication on hand      SHIPPING     Shipping address confirmed in Epic.     Delivery Scheduled: Yes, Expected medication delivery date: 10/16/2020.     Medication will be delivered via Same Day Courier to the prescription address in Epic WAM.    Samantha Miles D Samantha Miles   [facility] Shared [facility] Specialty Technician

## 2020-10-09 ENCOUNTER — Emergency Department
Admit: 2020-10-09 | Discharge: 2020-10-09 | Disposition: A | Discharge status: Home / Self Care | Payer: MEDICARE | Attending: Emergency Medicine

## 2020-10-09 ENCOUNTER — Ambulatory Visit
Admit: 2020-10-09 | Discharge: 2020-10-09 | Disposition: A | Discharge status: Home / Self Care | Payer: MEDICARE | Attending: Emergency Medicine

## 2020-10-09 DIAGNOSIS — Z7902 Long term (current) use of antithrombotics/antiplatelets: Principal | ICD-10-CM

## 2020-10-09 DIAGNOSIS — R109 Unspecified abdominal pain: Principal | ICD-10-CM

## 2020-10-09 DIAGNOSIS — K760 Fatty (change of) liver, not elsewhere classified: Principal | ICD-10-CM

## 2020-10-09 DIAGNOSIS — F32A Depression, unspecified: Principal | ICD-10-CM

## 2020-10-09 DIAGNOSIS — N200 Calculus of kidney: Principal | ICD-10-CM

## 2020-10-09 DIAGNOSIS — N12 Tubulo-interstitial nephritis, not specified as acute or chronic: Principal | ICD-10-CM

## 2020-10-09 DIAGNOSIS — L309 Dermatitis, unspecified: Principal | ICD-10-CM

## 2020-10-09 LAB — CBC W/ AUTO DIFF
BASOPHILS ABSOLUTE COUNT: 0.1 10*9/L (ref 0.0–0.1)
BASOPHILS RELATIVE PERCENT: 0.4 %
EOSINOPHILS ABSOLUTE COUNT: 0.3 10*9/L (ref 0.0–0.4)
EOSINOPHILS RELATIVE PERCENT: 2.2 %
HEMATOCRIT: 44 % (ref 36.0–46.0)
HEMOGLOBIN: 15 g/dL (ref 12.0–16.0)
LARGE UNSTAINED CELLS: 1 % (ref 0–4)
LYMPHOCYTES ABSOLUTE COUNT: 1.6 10*9/L (ref 1.5–5.0)
LYMPHOCYTES RELATIVE PERCENT: 13.2 %
MEAN CORPUSCULAR HEMOGLOBIN CONC: 34.2 g/dL (ref 31.0–37.0)
MEAN CORPUSCULAR HEMOGLOBIN: 30.9 pg (ref 26.0–34.0)
MEAN CORPUSCULAR VOLUME: 90.4 fL (ref 80.0–100.0)
MEAN PLATELET VOLUME: 8.4 fL (ref 7.0–10.0)
MONOCYTES ABSOLUTE COUNT: 0.4 10*9/L (ref 0.2–0.8)
MONOCYTES RELATIVE PERCENT: 3.5 %
NEUTROPHILS ABSOLUTE COUNT: 9.8 10*9/L — ABNORMAL HIGH (ref 2.0–7.5)
NEUTROPHILS RELATIVE PERCENT: 79.9 %
PLATELET COUNT: 374 10*9/L (ref 150–440)
RED BLOOD CELL COUNT: 4.87 10*12/L (ref 4.00–5.20)
RED CELL DISTRIBUTION WIDTH: 14.5 % (ref 12.0–15.0)
WBC ADJUSTED: 12.3 10*9/L — ABNORMAL HIGH (ref 4.5–11.0)

## 2020-10-09 LAB — URINALYSIS WITH CULTURE REFLEX
BILIRUBIN UA: NEGATIVE
GLUCOSE UA: NEGATIVE
KETONES UA: NEGATIVE
NITRITE UA: NEGATIVE
PH UA: 6 (ref 5.0–9.0)
PROTEIN UA: NEGATIVE
RBC UA: 8 /HPF — ABNORMAL HIGH (ref <=4)
SPECIFIC GRAVITY UA: 1.015 (ref 1.003–1.030)
SQUAMOUS EPITHELIAL: 1 /HPF (ref 0–5)
UROBILINOGEN UA: 0.2
WBC UA: 101 /HPF — ABNORMAL HIGH (ref 0–5)

## 2020-10-09 LAB — COMPREHENSIVE METABOLIC PANEL
ALBUMIN: 3.9 g/dL (ref 3.4–5.0)
ALKALINE PHOSPHATASE: 93 U/L (ref 46–116)
ALT (SGPT): 16 U/L (ref 10–49)
ANION GAP: 8 mmol/L (ref 5–14)
AST (SGOT): 17 U/L (ref <=34)
BILIRUBIN TOTAL: 0.8 mg/dL (ref 0.3–1.2)
BLOOD UREA NITROGEN: 8 mg/dL — ABNORMAL LOW (ref 9–23)
BUN / CREAT RATIO: 8
CALCIUM: 9.2 mg/dL (ref 8.7–10.4)
CHLORIDE: 104 mmol/L (ref 98–107)
CO2: 25 mmol/L (ref 20.0–31.0)
CREATININE: 0.96 mg/dL
EGFR CKD-EPI AA FEMALE: 90 mL/min/{1.73_m2} (ref >=60)
EGFR CKD-EPI NON-AA FEMALE: 78 mL/min/{1.73_m2} (ref >=60)
GLUCOSE RANDOM: 134 mg/dL (ref 70–179)
POTASSIUM: 3.5 mmol/L (ref 3.4–4.5)
PROTEIN TOTAL: 7.2 g/dL (ref 5.7–8.2)
SODIUM: 137 mmol/L (ref 135–145)

## 2020-10-09 LAB — HCG QUANTITATIVE, BLOOD: GONADOTROPIN, CHORIONIC (HCG) QUANT: <2.6 m[IU]/mL

## 2020-10-09 MED ORDER — OXYCODONE 5 MG TABLET
ORAL_TABLET | ORAL | 0 refills | 2 days | Status: CP | PRN
Start: 2020-10-09 — End: ?

## 2020-10-09 MED ORDER — CEPHALEXIN 500 MG CAPSULE
ORAL_CAPSULE | Freq: Four times a day (QID) | ORAL | 0 refills | 10 days | Status: CP
Start: 2020-10-09 — End: 2020-10-19

## 2020-10-09 MED ADMIN — cefTRIAXone (ROCEPHIN) 1 g in sodium chloride 0.9 % (NS) 100 mL IVPB-connector bag: 1 g | INTRAVENOUS | @ 18:00:00 | Stop: 2020-10-09

## 2020-10-09 MED ADMIN — sodium chloride 0.9% (NS) bolus 1,000 mL: 1000 mL | INTRAVENOUS | @ 15:00:00 | Stop: 2020-10-09

## 2020-10-09 MED ADMIN — MORPhine 4 mg/mL injection 4 mg: 4 mg | INTRAVENOUS | @ 15:00:00 | Stop: 2020-10-09

## 2020-10-09 MED ADMIN — oxyCODONE (ROXICODONE) immediate release tablet 5 mg: 5 mg | ORAL | @ 18:00:00 | Stop: 2020-10-09

## 2020-10-09 NOTE — Unmapped (Signed)
Bed: 08-A  Expected date:   Expected time:   Means of arrival:   Comments:  Trauma

## 2020-10-09 NOTE — Unmapped (Signed)
Presents with L flank pain x several days. States has urgency to urinate but sometimes isn't able to urinate. Denies fevers/chills/n/v/d/sob/cp.

## 2020-10-09 NOTE — Unmapped (Signed)
33yoF PMH nephrolithiasis (spontaneous stone passage, hasn't seen urologist in the past) presenting with flank pain. Afebrile and hemodynamically stable/well appearing. Found to have LE, WBC's and bacteria on UA. CT without hydronephrosis but with left lower pole non-obstructing stone. No AKI. Getting treated per ER providers with 14 days culture specific abx course. Needs establishment in stone clinic.      UROLOGY FOLLOW UP REQUEST    Requesting physician/contact for questions: Donnetta Simpers M Abu-Salha      Date(s) Needed: As available    Appt to be scheduled under: Attending clinic - Dr. Dow Adolph or Dr. Marcy Salvo     Type of Appt: New patient - Nephrolithiasis    Need to contact patient: Yes please    Special Requests/Instructions: None    Orders placed for Special Requests: N/A     Thank you!  Cherlyn Labella, MD  PGY3 Urologic Surgery

## 2020-10-09 NOTE — Unmapped (Signed)
[facility]  Emergency Department Provider Note            ED Clinical Impression      Final diagnoses:   Pyelonephritis (Primary)           Impression, ED Course, Assessment and Plan      Impression: Pt is a 34 y.o. presents with L sided flank and abdominal pain in the setting of urinary changes.  Does have a history of prior stones.  States pain is constant in nature.  Sharp and aching.  No vaginal discharge to suggest vaginitis as etiology. Does have hx of recurrent UTI so pyelonephritis and UTI is a possibility. Also consider nephrolithiasis given hx of known stones. Will UA, cultures, labs and CT stone protocol. No diarrhea or hx of diverticulitis making that less likely. Pain control. IVF hydration. Reassess.       Pt with noted UA consistent with infection. Labs otherwise reassuring. CT with noted stones in the kidney, but none in the ureter or any obstruction/ hydronephrosis. Will discuss with urology. Pt otherwise well appearing with pain well controlled with prior administration of morphine. Will plan for oral oxycodone for longer pain control. I have reviewed patient's most recent urine culture in December with a noted E. coli organism.  Appeared sensitive to ceftriaxone.  We will give that IV here and plan to send home on Keflex.    Urology has seen and has agreed with medical management with oral abx. No indication for emergent/ urgent surgical intervention at this time. Will plan to send home with oral abx. Strict return instructions reviewed with the patient who stated understanding of recommendations and comfort with our stated plan.          Additional Medical Decision Making     I have reviewed the vital signs and the nursing notes. Labs and radiology results that were available during my care of the patient were independently reviewed by me and considered in my medical decision making.       I independently visualized the radiology images.   I reviewed the patient's prior medical records I discussed the case with the urology consultant.     Portions of this record have been created using Scientist, clinical (histocompatibility and immunogenetics). Dictation errors have been sought, but Harman Ferrin not have been identified and corrected.  ____________________________________________         History        Chief Complaint  Flank Pain      HPI   Samantha Miles is a 34 y.o. female presents with complaints of L sided back and flank pain. Pt notes that symptoms started several days ago, getting progressively worse. States has increased hesitancy and urgency. No fevers/chills. No retention. No nausea/vomiting. States is similar to past episodes of pyelonephritis. Pt does have a hx of renal stones - is unclear which side her past stones are located. No diarrhea. Normal BMs. No vaginal discharge or bleeding. Increased concentration of urine. No chest pain or shortness of breath. No cough. No headache. Pt notes her pain is sharp and aching and constant in nature. No radiation. No change with po intake. No change with positioning or urinating. No change with BM.       Past Medical History:   Diagnosis Date   ??? Depression    ??? Eczema        There is no problem list on file for this patient.      Past Surgical History:   Procedure  Laterality Date   ??? INNER EAR SURGERY     ??? MIDDLE EAR SURGERY     ??? TUBAL LIGATION           Current Facility-Administered Medications:   ???  cefTRIAXone (ROCEPHIN) 1 g in sodium chloride 0.9 % (NS) 100 mL IVPB-connector bag, 1 g, Intravenous, Once, Gabriela Irigoyen Yung-Yun Ayisha Pol, MD  ???  oxyCODONE (ROXICODONE) immediate release tablet 5 mg, 5 mg, Oral, Once, Fay Swider Yung-Yun Rana Adorno, MD    Current Outpatient Medications:   ???  betamethasone dipropionate (DIPROLENE) 0.05 % ointment, betamethasone dipropionate 0.05 % topical ointment, Disp: , Rfl:   ???  betamethasone, augmented, (DIPROLENE) 0.05 % cream, Apply twice daily to areas of eczema until flat and smooth. Avoid face and skin folds, Disp: 50 g, Rfl: 5  ???  clobetasoL (TEMOVATE) 0.05 % ointment, Apply to eczema on hands at night until resolved., Disp: 60 g, Rfl: 5  ???  dupilumab (DUPIXENT SYRINGE) 300 mg/2 mL Syrg injection, Inject the contents of 1 syringe (300 mg total) under the skin every fourteen (14) days., Disp: 4 mL, Rfl: 5  ???  empty container Misc, Use as directed to dispose of Dupixent syringes., Disp: 1 each, Rfl: 2  ???  fexofenadine (ALLEGRA) 180 MG tablet, Take 180 mg by mouth daily., Disp: , Rfl: 11  ???  ibuprofen (ADVIL,MOTRIN) 800 MG tablet, TAKE 1 TABLET BY MOUTH EVERY 8 HOURS AS NEEDED FOR MODERATE PAIN, Disp: , Rfl: 0  ???  meloxicam (MOBIC) 15 MG tablet, Mobic 15 mg tablet  Take 1 tablet(s) every day by oral route., Disp: , Rfl:   ???  nystatin (MYCOSTATIN) 100,000 unit/gram ointment, Apply 1 application topically Two (2) times a day. To scaly areas on feet and between toes, Disp: 30 g, Rfl: 5  ???  nystatin (MYCOSTATIN) powder, , Disp: , Rfl:   ???  ondansetron (ZOFRAN) 4 MG tablet, Take 4 mg by mouth., Disp: , Rfl:   ???  PARoxetine (PAXIL) 40 MG tablet, , Disp: , Rfl:   ???  TRUEPLUS PEN NEEDLE 31 gauge x 1/4 (6 mm) Ndle, , Disp: , Rfl:   ???  VICTOZA 2-PAK 0.6 mg/0.1 mL (18 mg/3 mL) injection, , Disp: , Rfl:     Allergies  Sulfacetamide sodium and Sulfamethoxazole    Family History   Problem Relation Age of Onset   ??? Multiple sclerosis Mother    ??? Melanoma Neg Hx    ??? Basal cell carcinoma Neg Hx    ??? Squamous cell carcinoma Neg Hx        Social History  Social History     Tobacco Use   ??? Smoking status: Never Smoker   ??? Smokeless tobacco: Never Used   Substance Use Topics   ??? Alcohol use: No     Alcohol/week: 0.0 standard drinks   ??? Drug use: No       Review of Systems  Constitutional: Negative for fever.  Eyes: Negative for visual changes.  ENT: Negative for sore throat.  Cardiovascular: Negative for chest pain.  Respiratory: Negative for shortness of breath.  Gastrointestinal: : per hpi  Genitourinary: per hpi  Musculoskeletal: Negative for back pain.  Skin: Negative for rash.  Neurological: Negative for headaches, focal weakness or numbness.       Physical Exam     This provider entered the patient's room: Yes:    ??? If this provider did not enter the room, a comprehensive physical exam was not able to be  performed due to increased infection risk to themselves, other providers, staff and other patients), as well as to conserve personal protective equipment (PPE) utilization during the COVID-19 pandemic.    ??? If this provider did enter the patient room, the following was PPE worn: Surgical mask, eye protection and gloves    ED Triage Vitals   Enc Vitals Group      BP 10/09/20 1016 139/100      Pulse --       SpO2 Pulse 10/09/20 0610 97      Resp 10/09/20 0610 19      Temp 10/09/20 0610 37 ??C (98.6 ??F)      Temp Source 10/09/20 0610 Temporal      SpO2 10/09/20 0610 100 %      Weight 10/09/20 0610 (!) 117.9 kg (260 lb)         Constitutional: Alert and oriented. Well appearing and in no distress.  Eyes: Conjunctivae are normal.  ENT       Head: Normocephalic and atraumatic.       Nose: No congestion.       Mouth/Throat: Mucous membranes are moist.       Neck: No stridor.  Hematological/Lymphatic/Immunilogical: No cervical lymphadenopathy.  Cardiovascular: Normal rate, regular rhythm. Normal and symmetric distal pulses are present in all extremities.  Respiratory: Normal respiratory effort. Breath sounds are normal.  Gastrointestinal: Soft and nondistended. + tenderness L mid and lower abdomen. There is no CVA tenderness.  Musculoskeletal: Normal range of motion in all extremities.       Right lower leg: No tenderness or edema.       Left lower leg: No tenderness or edema.  Neurologic: Normal speech and language. No gross focal neurologic deficits are appreciated.  Skin: Skin is warm, dry and intact. No rash noted.  Psychiatric: Mood and affect are normal. Speech and behavior are normal.       Radiology     CT abdomen pelvis without contrast   Final Result   -- Nonobstructing left-sided nephrolithiasis. No hydronephrosis.   -- Hepatic steatosis                  Riot Barrick Faythe Ghee, MD  10/11/20 1615

## 2020-10-11 MED ORDER — CEPHALEXIN 500 MG CAPSULE
ORAL_CAPSULE | Freq: Four times a day (QID) | ORAL | 0 refills | 4 days | Status: CP
Start: 2020-10-11 — End: 2020-10-15

## 2020-10-11 NOTE — Unmapped (Signed)
Contains abnormal data??Urine Culture  Order: [id number] - Reflex for Order [id number]  Status: Preliminary result ??    50,000 to 100,000 CFU/mL Escherichia coli??Abnormal??       Specimen Source: Clean Catch    cephalexin (KEFLEX) 500 MG capsule [9500]

## 2020-10-14 NOTE — Unmapped (Signed)
Spoke to patient per Dr. De Hollingshead request and informed her that her antibiotic she was given was for a 10 day course but she will need to be on it for 14 days and that the remainder of the rx was sent to her pharmacy on file.  Pt verbalized understanding and denies any further concerns.  10/14/20 @ 1103

## 2020-10-16 MED FILL — DUPIXENT 300 MG/2 ML SUBCUTANEOUS SYRINGE: 28 days supply | Qty: 4 | Fill #3 | Status: AC

## 2020-10-16 MED FILL — DUPIXENT 300 MG/2 ML SUBCUTANEOUS SYRINGE: SUBCUTANEOUS | 28 days supply | Qty: 4 | Fill #3

## 2020-11-12 NOTE — Unmapped (Signed)
Samantha Miles reports things are going well with her Dupixent. Her hands are improved from her last visit with clinic, but she now has a flare on her neck. This area is responsive to topical steroids, and she has a supply of cream.    She denies eye irritation or other adverse effects from Dupixent.     [facility] Clinical Assessment & Refill Coordination Note    Samantha Miles, DOB: 1986-01-25  Phone: [phone] (home)     All above HIPAA information was verified with patient.     Was a Nurse, learning disability used for this call? No    Specialty Medication(s):   Inflammatory Disorders: Dupixent     Current Outpatient Medications   Medication Sig Dispense Refill   ??? betamethasone dipropionate (DIPROLENE) 0.05 % ointment betamethasone dipropionate 0.05 % topical ointment     ??? betamethasone, augmented, (DIPROLENE) 0.05 % cream Apply twice daily to areas of eczema until flat and smooth. Avoid face and skin folds 50 g 5   ??? clobetasoL (TEMOVATE) 0.05 % ointment Apply to eczema on hands at night until resolved. 60 g 5   ??? dupilumab (DUPIXENT SYRINGE) 300 mg/2 mL Syrg injection Inject the contents of 1 syringe (300 mg total) under the skin every fourteen (14) days. 4 mL 5   ??? empty container Misc Use as directed to dispose of Dupixent syringes. 1 each 2   ??? fexofenadine (ALLEGRA) 180 MG tablet Take 180 mg by mouth daily.  11   ??? ibuprofen (ADVIL,MOTRIN) 800 MG tablet TAKE 1 TABLET BY MOUTH EVERY 8 HOURS AS NEEDED FOR MODERATE PAIN  0   ??? meloxicam (MOBIC) 15 MG tablet Mobic 15 mg tablet   Take 1 tablet(s) every day by oral route.     ??? nystatin (MYCOSTATIN) 100,000 unit/gram ointment Apply 1 application topically Two (2) times a day. To scaly areas on feet and between toes 30 g 5   ??? nystatin (MYCOSTATIN) powder      ??? ondansetron (ZOFRAN) 4 MG tablet Take 4 mg by mouth.     ??? oxyCODONE (ROXICODONE) 5 MG immediate release tablet Take 1 tablet (5 mg total) by mouth every four (4) hours as needed for pain for up to 12 doses. 12 tablet 0   ??? PARoxetine (PAXIL) 40 MG tablet      ??? TRUEPLUS PEN NEEDLE 31 gauge x 1/4 (6 mm) Ndle      ??? VICTOZA 2-PAK 0.6 mg/0.1 mL (18 mg/3 mL) injection        No current facility-administered medications for this visit.        Changes to medications: Samantha Miles reports no changes at this time.    Allergies   Allergen Reactions   ??? Sulfacetamide Sodium Hives and Rash   ??? Sulfamethoxazole Rash       Changes to allergies: No    SPECIALTY MEDICATION ADHERENCE     Dupixent - 0 left  Medication Adherence    Patient reported X missed doses in the last month: 0  Specialty Medication: Dupixent          Specialty medication(s) dose(s) confirmed: Regimen is correct and unchanged.     Are there any concerns with adherence? No    Adherence counseling provided? Not needed    CLINICAL MANAGEMENT AND INTERVENTION      Clinical Benefit Assessment:    Do you feel the medicine is effective or helping your condition? Yes    Clinical Benefit counseling provided?  Not needed    Adverse Effects Assessment:    Are you experiencing any side effects? No    Are you experiencing difficulty administering your medicine? No    Quality of Life Assessment:    How many days over the past month did your AD  keep you from your normal activities? For example, brushing your teeth or getting up in the morning. 0    Have you discussed this with your provider? Not needed    Therapy Appropriateness:    Is therapy appropriate? Yes, therapy is appropriate and should be continued    DISEASE/MEDICATION-SPECIFIC INFORMATION      For patients on injectable medications: Patient currently has 0 doses left.  Next injection is scheduled for Wed, 12/22.    PATIENT SPECIFIC NEEDS     - Does the patient have any physical, cognitive, or cultural barriers? No    - Is the patient high risk? No     - Does the patient require a Care Management Plan? No     - Does the patient require physician intervention or other additional services (i.e. nutrition, smoking cessation, social work)? No      SHIPPING     Specialty Medication(s) to be Shipped:   Inflammatory Disorders: Dupixent    Other medication(s) to be shipped: na     Changes to insurance: No    Delivery Scheduled: Yes, Expected medication delivery date: Thurs, Dec 16.     Medication will be delivered via Same Day Courier to the confirmed prescription address in [facility].    The patient will receive a drug information handout for each medication shipped and additional FDA Medication Guides as required.  Verified that patient has previously received a Conservation officer, historic buildings.    All of the patient's questions and concerns have been addressed.    Lanney Gins   [facility] Specialty Pharmacist

## 2020-11-14 DIAGNOSIS — L209 Atopic dermatitis, unspecified: Principal | ICD-10-CM

## 2020-11-14 DIAGNOSIS — L309 Dermatitis, unspecified: Principal | ICD-10-CM

## 2020-11-14 MED FILL — DUPIXENT 300 MG/2 ML SUBCUTANEOUS SYRINGE: SUBCUTANEOUS | 28 days supply | Qty: 4 | Fill #4

## 2020-11-14 MED FILL — DUPIXENT 300 MG/2 ML SUBCUTANEOUS SYRINGE: 28 days supply | Qty: 4 | Fill #4 | Status: AC

## 2020-12-07 NOTE — Unmapped (Signed)
[facility] Refill Coordination Note    Specialty Medication(s) to be Shipped:   Inflammatory Disorders: Dupixent    Other medication(s) to be shipped: No additional medications requested for fill at this time     Samantha Miles, DOB: December 11, 1985  Phone: [phone] (home)       All above HIPAA information was verified with patient.     Was a Nurse, learning disability used for this call? No    Completed refill call assessment today to schedule patient's medication shipment from the [facility] [phone]).       Specialty medication(s) and dose(s) confirmed: Regimen is correct and unchanged.   Changes to medications: Samantha Miles reports no changes at this time.  Changes to insurance: No  Questions for the pharmacist: No    Confirmed patient received Welcome Packet with first shipment. The patient will receive a drug information handout for each medication shipped and additional FDA Medication Guides as required.       DISEASE/MEDICATION-SPECIFIC INFORMATION        For patients on injectable medications: Patient currently has 1 doses left.  Next injection is scheduled for 12/11/20.    SPECIALTY MEDICATION ADHERENCE     Medication Adherence    Patient reported X missed doses in the last month: 0  Specialty Medication: dupixent  Patient is on additional specialty medications: No                Dupixent 300/2 mg/ml: 5 days of medicine on hand          SHIPPING     Shipping address confirmed in Epic.     Delivery Scheduled: Yes, Expected medication delivery date: 12/18/20.     Medication will be delivered via Same Day Courier to the prescription address in Epic W[facility].    Unk Lightning   [facility] Specialty Technician

## 2020-12-17 DIAGNOSIS — L209 Atopic dermatitis, unspecified: Principal | ICD-10-CM

## 2020-12-17 DIAGNOSIS — L309 Dermatitis, unspecified: Principal | ICD-10-CM

## 2020-12-18 MED FILL — DUPIXENT 300 MG/2 ML SUBCUTANEOUS SYRINGE: SUBCUTANEOUS | 28 days supply | Qty: 4 | Fill #5

## 2020-12-23 ENCOUNTER — Other Ambulatory Visit: Payer: Medicare HMO

## 2020-12-23 DIAGNOSIS — Z20822 Contact with and (suspected) exposure to covid-19: Secondary | ICD-10-CM

## 2020-12-24 LAB — NOVEL CORONAVIRUS, NAA: SARS-CoV-2, NAA: DETECTED — AB

## 2020-12-24 LAB — SARS-COV-2, NAA 2 DAY TAT

## 2020-12-28 ENCOUNTER — Other Ambulatory Visit: Payer: Medicare HMO

## 2021-01-06 ENCOUNTER — Other Ambulatory Visit: Payer: Medicare HMO

## 2021-01-06 DIAGNOSIS — Z20822 Contact with and (suspected) exposure to covid-19: Secondary | ICD-10-CM

## 2021-01-07 LAB — SARS-COV-2, NAA 2 DAY TAT

## 2021-01-07 LAB — NOVEL CORONAVIRUS, NAA: SARS-CoV-2, NAA: NOT DETECTED

## 2021-01-09 DIAGNOSIS — L209 Atopic dermatitis, unspecified: Principal | ICD-10-CM

## 2021-01-09 DIAGNOSIS — L309 Dermatitis, unspecified: Principal | ICD-10-CM

## 2021-01-09 MED ORDER — DUPIXENT 300 MG/2 ML SUBCUTANEOUS SYRINGE
SUBCUTANEOUS | 5 refills | 28 days | Status: CP
Start: 2021-01-09 — End: ?

## 2021-01-09 NOTE — Unmapped (Signed)
Prescription refill request for dupixent , Last office visit was 07/11/20    Please Advise

## 2021-01-15 NOTE — Unmapped (Signed)
[facility] Refill Coordination Note    Specialty Medication(s) to be Shipped:   Inflammatory Disorders: Dupixent    Other medication(s) to be shipped: No additional medications requested for fill at this time     Samantha Miles, DOB: Jun 01, 1986  Phone: [phone] (home)       All above HIPAA information was verified with patient.     Was a Nurse, learning disability used for this call? No    Completed refill call assessment today to schedule patient's medication shipment from the [facility] [phone]).       Specialty medication(s) and dose(s) confirmed: Regimen is correct and unchanged.   Changes to medications: Samantha Miles reports no changes at this time.  Changes to insurance: No  Questions for the pharmacist: No    Confirmed patient received Welcome Packet with first shipment. The patient will receive a drug information handout for each medication shipped and additional FDA Medication Guides as required.       DISEASE/MEDICATION-SPECIFIC INFORMATION        For patients on injectable medications: Patient currently has 1 doses left.  Next injection is scheduled for 02/16.    SPECIALTY MEDICATION ADHERENCE     Medication Adherence    Patient is on additional specialty medications: No  Patient is on more than two specialty medications: No  Any gaps in refill history greater than 2 weeks in the last 3 months: no  Demonstrates understanding of importance of adherence: yes  Informant: patient  Reliability of informant: reliable  Provider-estimated medication adherence level: good  Patient is at risk for Non-Adherence: No                dupixent 300/2 mg/ml: 14 days of medicine on hand         SHIPPING     Shipping address confirmed in Epic.     Delivery Scheduled: Yes, Expected medication delivery date: 02/23.     Medication will be delivered via Same Day Courier to the prescription address in Epic WAM.    Samantha Miles   [facility] Specialty Technician

## 2021-01-22 MED ORDER — DUPIXENT 300 MG/2 ML SUBCUTANEOUS SYRINGE
SUBCUTANEOUS | 5 refills | 28.00000 days
Start: 2021-01-22 — End: ?

## 2021-01-22 MED FILL — DUPIXENT 300 MG/2 ML SUBCUTANEOUS SYRINGE: SUBCUTANEOUS | 28 days supply | Qty: 4 | Fill #0

## 2021-01-23 ENCOUNTER — Ambulatory Visit
Admit: 2021-01-23 | Discharge: 2021-01-24 | Payer: MEDICARE | Attending: Student in an Organized Health Care Education/Training Program | Primary: Student in an Organized Health Care Education/Training Program

## 2021-01-23 DIAGNOSIS — L209 Atopic dermatitis, unspecified: Principal | ICD-10-CM

## 2021-01-23 DIAGNOSIS — L309 Dermatitis, unspecified: Principal | ICD-10-CM

## 2021-01-23 MED ORDER — CLOBETASOL 0.05 % TOPICAL OINTMENT
OPHTHALMIC | 5 refills | 0.00000 days | Status: CP
Start: 2021-01-23 — End: ?

## 2021-01-23 NOTE — Unmapped (Signed)
Dermatology Note     Assessment and Plan:        Atopic dermatitis, unspecified type, chronic with residual activity on forearms  - Overall significantly improved on dupilumab therapy with focal areas on bilateral forearms  - dupilumab (DUPIXENT SYRINGE) 300 mg/2 mL Syrg injection; Inject the contents of 1 syringe (300 mg total) under the skin every fourteen (14) days. Refilled today through [facility]  - Clobetasol ointment refilled today to [facility] pharmacy.   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  We discussed the importance of monitoring and skin examinations.         The patient was advised to call for an appointment should any new, changing, or symptomatic lesions develop.     RTC: Return in about 6 months (around 07/23/2021) for Skin Check. or sooner as needed   _________________________________________________________________      Chief Complaint     Chief Complaint   Patient presents with   ??? Medication Refill       HPI     Samantha Miles is a 35 y.o. female who presents as a returning patient (last seen 07/11/2020) to [facility] for atopic dermatitis.  Since last visit, patient reports continued improvement with Dupixent. She does have residual activity on the forearms. Has been using clobetasol to the areas as needed. Tolerating Dupixent well without conjunctivitis or local site reactions.     The patient denies any other new or changing lesions or areas of concern.     Pertinent Past Medical History     No history of skin cancer    Problem List     None          Family History:   Negative for melanoma    Past Medical History, Family History, Social History, Medication List, Allergies, and Problem List were reviewed in the rooming section of Epic.     ROS: Other than symptoms mentioned in the HPI, no fevers, chills, or other skin complaints    Physical Examination     GENERAL: Well-appearing female in no acute distress, resting comfortably.  NEURO: Alert and oriented, answers questions appropriately  SKIN: Examination of the face, neck, abdomen, bilateral upper extremities, bilateral lower extremities and buttocks was performed  - eczematous patches on b/l anterior forearms       All areas not commented on are within normal limits or unremarkable      (Approved Template 07/10/2020)

## 2021-01-24 NOTE — Unmapped (Signed)
[facility] releases most results to you as soon as they are available. Therefore, you may see some results before we do. Please give us 2 business days to review the tests and contact you by phone or through MyChart. If you are concerned that some results may be upsetting or confusing, you may wish to wait until we contact you before looking at the report in MyChart.   If you have an urgent question, you can send us a message or call our clinic. Otherwise, we prefer that you wait 2 business days for us to contact you.     Dermatology

## 2021-02-18 NOTE — Unmapped (Signed)
[facility] Refill Coordination Note    Specialty Medication(s) to be Shipped:   Inflammatory Disorders: Dupixent    Other medication(s) to be shipped: No additional medications requested for fill at this time     Samantha Miles, DOB: May 13, 1986  Phone: [phone] (home)       All above HIPAA information was verified with patient.     Was a Nurse, learning disability used for this call? No    Completed refill call assessment today to schedule patient's medication shipment from the [facility] [phone]).       Specialty medication(s) and dose(s) confirmed: Regimen is correct and unchanged.   Changes to medications: Samantha Miles reports no changes at this time.  Changes to insurance: No  Questions for the pharmacist: No    Confirmed patient received Welcome Packet with first shipment. The patient will receive a drug information handout for each medication shipped and additional FDA Medication Guides as required.       DISEASE/MEDICATION-SPECIFIC INFORMATION        For patients on injectable medications: Patient currently has 0 doses left.  Next injection is scheduled for 02/26/2021.    SPECIALTY MEDICATION ADHERENCE     Medication Adherence    Patient reported X missed doses in the last month: 0  Specialty Medication: Dupixent 300 mg/2 ml  Patient is on additional specialty medications: No  Any gaps in refill history greater than 2 weeks in the last 3 months: no  Demonstrates understanding of importance of adherence: yes  Informant: patient  Reliability of informant: reliable  Confirmed plan for next specialty medication refill: delivery by pharmacy  Refills needed for supportive medications: not needed                dupixent 300/2 mg/ml: 0 days of medicine on hand         SHIPPING     Shipping address confirmed in Epic.     Delivery Scheduled: Yes, Expected medication delivery date: 02/19/2021.     Medication will be delivered via Same Day Courier to the prescription address in Epic WAM.    Billiejo Sorto D Marquavius Scaife   [facility] Specialty Technician

## 2021-02-19 MED FILL — DUPIXENT 300 MG/2 ML SUBCUTANEOUS SYRINGE: SUBCUTANEOUS | 28 days supply | Qty: 4 | Fill #0

## 2021-03-13 NOTE — Unmapped (Signed)
[facility] Refill Coordination Note    Specialty Medication(s) to be Shipped:   Inflammatory Disorders: Dupixent    Other medication(s) to be shipped: No additional medications requested for fill at this time     Samantha Miles, DOB: May 12, 1986  Phone: [phone] (home)       All above HIPAA information was verified with patient.     Was a Nurse, learning disability used for this call? No    Completed refill call assessment today to schedule patient's medication shipment from the [facility] [phone]).  All relevant notes have been reviewed.     Specialty medication(s) and dose(s) confirmed: Regimen is correct and unchanged.   Changes to medications: Allycia reports no changes at this time.  Changes to insurance: No  New side effects reported not previously addressed with a pharmacist or physician: None reported  Questions for the pharmacist: No    Confirmed patient received a Conservation officer, historic buildings and a Surveyor, mining with first shipment. The patient will receive a drug information handout for each medication shipped and additional FDA Medication Guides as required.       DISEASE/MEDICATION-SPECIFIC INFORMATION        For patients on injectable medications: Patient currently has 0 doses left.  Next injection is scheduled for 03/26/2021.    SPECIALTY MEDICATION ADHERENCE     Medication Adherence    Patient reported X missed doses in the last month: 0  Specialty Medication: Dupixent 300 mg/2 ml  Patient is on additional specialty medications: No  Any gaps in refill history greater than 2 weeks in the last 3 months: no  Demonstrates understanding of importance of adherence: yes  Informant: patient  Reliability of informant: reliable  Confirmed plan for next specialty medication refill: delivery by pharmacy  Refills needed for supportive medications: not needed              Were doses missed due to medication being on hold? No    Dupixent 300 mg/2 ml mg/ml: 0 days of medicine on hand REFERRAL TO PHARMACIST     Referral to the pharmacist: Not needed      [facility]     Shipping address confirmed in Epic.     Delivery Scheduled: Yes, Expected medication delivery date: 03/19/2021.     Medication will be delivered via Same Day Courier to the prescription address in Epic WAM.    Lukisha Procida D Tuere Nwosu   [facility] Specialty Technician

## 2021-03-19 MED FILL — DUPIXENT 300 MG/2 ML SUBCUTANEOUS SYRINGE: SUBCUTANEOUS | 28 days supply | Qty: 4 | Fill #1

## 2021-03-28 LAB — BASIC METABOLIC PANEL
ANION GAP: 7 mmol/L (ref 5–14)
CALCIUM: 9.4 mg/dL (ref 8.7–10.4)
CHLORIDE: 101 mmol/L (ref 98–107)
CO2: 26 mmol/L (ref 20.0–31.0)
CREATININE: 0.7 mg/dL
EGFR CKD-EPI AA FEMALE: >90 mL/min/{1.73_m2} (ref >=60)
EGFR CKD-EPI NON-AA FEMALE: >90 mL/min/{1.73_m2} (ref >=60)
GLUCOSE RANDOM: 138 mg/dL (ref 70–179)
SODIUM: 134 mmol/L — ABNORMAL LOW (ref 135–145)

## 2021-03-28 LAB — CBC W/ AUTO DIFF
BASOPHILS ABSOLUTE COUNT: 0.1 10*9/L (ref 0.0–0.1)
BASOPHILS RELATIVE PERCENT: 0.5 %
EOSINOPHILS ABSOLUTE COUNT: 0 10*9/L (ref 0.0–0.5)
EOSINOPHILS RELATIVE PERCENT: 0.1 %
HEMATOCRIT: 44.7 % — ABNORMAL HIGH (ref 34.0–44.0)
HEMOGLOBIN: 15.1 g/dL — ABNORMAL HIGH (ref 11.3–14.9)
LYMPHOCYTES ABSOLUTE COUNT: 2.8 10*9/L (ref 1.1–3.6)
LYMPHOCYTES RELATIVE PERCENT: 16.6 %
MEAN CORPUSCULAR HEMOGLOBIN CONC: 33.8 g/dL (ref 32.0–36.0)
MEAN CORPUSCULAR HEMOGLOBIN: 30 pg (ref 25.9–32.4)
MEAN CORPUSCULAR VOLUME: 88.8 fL (ref 77.6–95.7)
MEAN PLATELET VOLUME: 8.4 fL (ref 6.8–10.7)
MONOCYTES ABSOLUTE COUNT: 0.8 10*9/L (ref 0.3–0.8)
MONOCYTES RELATIVE PERCENT: 4.7 %
NEUTROPHILS ABSOLUTE COUNT: 13.2 10*9/L — ABNORMAL HIGH (ref 1.8–7.8)
NEUTROPHILS RELATIVE PERCENT: 78.1 %
NUCLEATED RED BLOOD CELLS: 0 /100{WBCs} (ref <=4)
PLATELET COUNT: 417 10*9/L (ref 150–450)
RED BLOOD CELL COUNT: 5.03 10*12/L (ref 3.95–5.13)
RED CELL DISTRIBUTION WIDTH: 13.5 % (ref 12.2–15.2)
WBC ADJUSTED: 16.9 10*9/L — ABNORMAL HIGH (ref 3.6–11.2)

## 2021-03-28 LAB — URINALYSIS WITH CULTURE REFLEX
BILIRUBIN UA: NEGATIVE
BLOOD UA: NEGATIVE
GLUCOSE UA: NEGATIVE
KETONES UA: NEGATIVE
LEUKOCYTE ESTERASE UA: NEGATIVE
NITRITE UA: NEGATIVE
PH UA: 8 (ref 5.0–9.0)
PROTEIN UA: NEGATIVE
RBC UA: 0 /HPF (ref <4)
SPECIFIC GRAVITY UA: 1.02 (ref 1.005–1.040)
SQUAMOUS EPITHELIAL: 30 /HPF — ABNORMAL HIGH (ref 0–5)
UROBILINOGEN UA: 0.2
WBC UA: 4 /HPF (ref 0–5)

## 2021-03-28 LAB — PREGNANCY, URINE: PREGNANCY TEST URINE: NEGATIVE

## 2021-03-29 ENCOUNTER — Emergency Department
Admit: 2021-03-29 | Discharge: 2021-03-29 | Disposition: A | Discharge status: Home / Self Care | Payer: MEDICARE | Attending: Emergency Medicine

## 2021-03-29 ENCOUNTER — Ambulatory Visit
Admit: 2021-03-29 | Discharge: 2021-03-29 | Disposition: A | Discharge status: Home / Self Care | Payer: MEDICARE | Attending: Emergency Medicine

## 2021-03-29 MED ADMIN — iohexoL (OMNIPAQUE) 350 mg iodine/mL solution 100 mL: 100 mL | INTRAVENOUS | @ 06:00:00 | Stop: 2021-03-29

## 2021-03-29 MED ADMIN — ketorolac (TORADOL) injection 15 mg: 15 mg | INTRAVENOUS | @ 07:00:00 | Stop: 2021-03-29

## 2021-03-29 NOTE — Unmapped (Signed)
Patient presenting with 1 day of increased urinary frequency and L flank pain. No dysuria, vomiting, fevers/chills, or diarrhea. Ambulatory and well-appearing.

## 2021-03-29 NOTE — Unmapped (Signed)
[facility] Emergency Department Provider Note    ED Clinical Impression     Final diagnoses:   Flank pain (Primary)       ED Course, Assessment and Plan     Initial Clinical Impression:    March 28, 2021 9:42 PM   Samantha Miles is a 35 y.o. female with no pertinent PMH presenting with 7/10 left flank pain onset around lunchtime today as described below in HPI.      BP 167/111  - Pulse 84  - Temp 36.8 ??C (98.2 ??F) (Oral)  - Resp 16  - SpO2 98%     On Exam patient overall well-appearing without significant vital sign abnormalities but with notable tenderness palpation across the left upper quadrant left lower quadrant of the abdomen.  Considered possibility of atypical cardiac presentation but given description of pain, absence of any association with exertion or any nausea or diaphoresis, clear reproducibility on exam with palpation across the abdomen more likely intra-abdominal versus atypical cardiac.  Consideration was given to possibility of pyelonephritis/UTI/nephrolithiasis given patient's history of similar UTIs and symptoms however UA without findings suggestive of urinary tract infection at this time.  CBC did show slight leukocytosis to 16.9 though nonspecific in the presence of notable left upper quadrant left lower quadrant abdominal pain could consider additional imaging.  Shared decision-making was had with patient and given she lives 2 hours away from the ED does not wish to pursue imaging at this time.  We will plan for CT of the abdomen pelvis for further rule out of other potential intra-abdominal pathology.    Further ED updates and updates to plan as per ED Course below:    ED Course:  ED Course as of 03/29/21 0227   Sat Mar 29, 2021   0225 No acute findings on CT abdomen pelvis there is calcifications along the left kidney with suggestion of potential obstructing stone but believe this is more likely calcification given patient does not have any blood in the urine there is no acute findings on CT we will plan for discharge home with strict return precautions and close follow-up.       _____________________________________________________________________    The case was discussed with the attending physician who is in agreement with the above assessment and plan    Additional Medical Decision Making     I have reviewed the vital signs and the nursing notes. Labs and radiology results that were available during my care of the patient were independently reviewed by me and considered in my medical decision making.   I independently visualized the EKG tracing if performed  I independently visualized the radiology images if performed  I reviewed the patient's prior medical records if available.  Additional history obtained from family if available    History     CHIEF COMPLAINT:   Chief Complaint   Patient presents with   ??? Flank Pain       HPI: Samantha Miles is a 35 y.o. female with no pertinent PMH who presents to the ED with flank pain. The patient reports onset of 7/10 left flank pain onset around lunchtime today. She states that she has had a hx of similar symptoms. Per chart review, the patient was seen on 10/09/20 with left flank and abdominal pain in the setting of urinary changes and was diagnosed with pyelonephritis. Denies abdominal pain, diarrhea, vomiting, or fever.     PAST MEDICAL HISTORY/PAST SURGICAL HISTORY:   Past Medical History:  Diagnosis Date   ??? Depression    ??? Eczema        Past Surgical History:   Procedure Laterality Date   ??? INNER EAR SURGERY     ??? MIDDLE EAR SURGERY     ??? TUBAL LIGATION         MEDICATIONS:   No current facility-administered medications for this encounter.    Current Outpatient Medications:   ???  betamethasone dipropionate (DIPROLENE) 0.05 % ointment, betamethasone dipropionate 0.05 % topical ointment, Disp: , Rfl:   ???  betamethasone, augmented, (DIPROLENE) 0.05 % cream, Apply twice daily to areas of eczema until flat and smooth. Avoid face and skin folds, Disp: 50 g, Rfl: 5  ???  clobetasoL (TEMOVATE) 0.05 % ointment, Apply to eczema on hands at night until resolved., Disp: 60 g, Rfl: 5  ???  dupilumab (DUPIXENT SYRINGE) 300 mg/2 mL Syrg injection, Inject the contents of 1 syringe (300 mg total) under the skin every fourteen (14) days., Disp: 4 mL, Rfl: 5  ???  empty container Misc, Use as directed to dispose of Dupixent syringes., Disp: 1 each, Rfl: 2  ???  fexofenadine (ALLEGRA) 180 MG tablet, Take 180 mg by mouth daily., Disp: , Rfl: 11  ???  ibuprofen (ADVIL,MOTRIN) 800 MG tablet, TAKE 1 TABLET BY MOUTH EVERY 8 HOURS AS NEEDED FOR MODERATE PAIN, Disp: , Rfl: 0  ???  meloxicam (MOBIC) 15 MG tablet, Mobic 15 mg tablet  Take 1 tablet(s) every day by oral route., Disp: , Rfl:   ???  nystatin (MYCOSTATIN) 100,000 unit/gram ointment, Apply 1 application topically Two (2) times a day. To scaly areas on feet and between toes, Disp: 30 g, Rfl: 5  ???  nystatin (MYCOSTATIN) powder, , Disp: , Rfl:   ???  ondansetron (ZOFRAN) 4 MG tablet, Take 4 mg by mouth., Disp: , Rfl:   ???  oxyCODONE (ROXICODONE) 5 MG immediate release tablet, Take 1 tablet (5 mg total) by mouth every four (4) hours as needed for pain for up to 12 doses., Disp: 12 tablet, Rfl: 0  ???  PARoxetine (PAXIL) 40 MG tablet, , Disp: , Rfl:   ???  TRUEPLUS PEN NEEDLE 31 gauge x 1/4 (6 mm) Ndle, , Disp: , Rfl:   ???  VICTOZA 2-PAK 0.6 mg/0.1 mL (18 mg/3 mL) injection, , Disp: , Rfl:     ALLERGIES:   Sulfacetamide sodium and Sulfamethoxazole    SOCIAL HISTORY:   Social History     Tobacco Use   ??? Smoking status: Never Smoker   ??? Smokeless tobacco: Never Used   Substance Use Topics   ??? Alcohol use: No     Alcohol/week: 0.0 standard drinks       FAMILY HISTORY:  Family History   Problem Relation Age of Onset   ??? Multiple sclerosis Mother    ??? Melanoma Neg Hx    ??? Basal cell carcinoma Neg Hx    ??? Squamous cell carcinoma Neg Hx           Review of Systems     A 10 point review of systems was performed and is negative other than positive elements noted in HPI   Constitutional: Negative for fever/chills.  Eyes: Negative for visual changes.  ENT: Negative for sore throat.  Cardiovascular: Negative for chest pain.  Respiratory: Negative for shortness of breath.  Gastrointestinal: Negative for abdominal pain, Negative for vomiting, Negative for diarrhea.  Genitourinary: Positive for left flank pain. Negative for dysuria.  Musculoskeletal: Negative for unilateral leg swelling or calf pain.  Skin: Negative for rash.  Neurological: Negative for headaches, negative for focal weakness or numbness.    Physical Exam     VITAL SIGNS:    BP 167/111  - Pulse 84  - Temp 36.8 ??C (98.2 ??F) (Oral)  - Resp 16  - SpO2 98%       This provider entered the patient's room: Yes    ??? If this provider did not enter the room, a comprehensive physical exam was not able to be performed due to increased infection risk to themselves, other providers, staff and other patients), as well as to conserve personal protective equipment (PPE) utilization during the COVID-19 pandemic.    ??? If this provider did enter the patient room, the following appropriate PPE was worn including but not limited to: Surgical mask, eye protection and gloves,  Surgical mask, eye protection, gown and gloves,  N95, eye protection and gloves,  N95, eye protection, gown and gloves,  N95, eye protection, cap, gown and gloves,  CAPR with face shield, gown and gloves      Constitutional:   ??? Alert and oriented, obese appearing.   Head:   ??? Normocephalic and atraumatic  Eyes:   ??? Conjunctivae are normal, EOMI, PERRL  ENT:   ??? No notable congestion, Mucous membranes moist, External ears normal, no notable stridor  Cardiovascular:   ??? Rate as vitals above, regular rhythm. Normal and symmetric distal pulses are present in all extremities.   Respiratory:   ??? Normal respiratory effort. Breath sounds are normal.  Gastrointestinal:   ??? Soft, non-distended, tender to palpation in the left upper quadrant left lower quadrant of the abdomen without rebound or guarding no CVA tenderness is present  Genitourinary:   ??? Deferred  Musculoskeletal:    ??? Normal range of motion in all extremities. No tenderness or edema noted in B/L lower extremities  Neurologic:   ??? Normal speech and language. No gross focal neurologic deficits are appreciated.  Skin:   ??? Skin is warm, dry and intact. No rashes noted.  Psychiatric:   ??? Mood and affect are normal. Speech and behavior are normal.      Radiology     No orders to display       Labs     Labs Reviewed   PREGNANCY, URINE - Normal   URINALYSIS WITH CULTURE REFLEX     Pertinent labs & imaging results that were available during my care of the patient were reviewed by me and considered in my medical decision making (see chart for details).    Please note- This chart has been created using [employer]. Chart creation errors have been sought, but may not always be located and such creation errors, especially pronoun confusion, do NOT reflect on the standard of medical care.  ______________________________________________________   Documentation assistance was provided by Tommas Olp, Scribe, on March 28, 2021 at 9:43 PM for Cindy Hazy, MD.        Documentation assistance was provided by the scribe in my presence.  The documentation recorded by the scribe has been reviewed by me and accurately reflects the services I personally performed.    Cindy Hazy, MD  PGY2 Emergency Medicine           Ruben Reason  Resident  03/29/21 [phone]

## 2021-04-10 NOTE — Unmapped (Signed)
Chasitie reports things are going well with her Dupixent. She's had no recent flares. She continues to use clobetasol as needed, and she has a supply of this.    She denies eye irritation or other adverse effects from Dupixent.     [facility] Clinical Assessment & Refill Coordination Note    Clorine Swing, DOB: 1986-01-15  Phone: [phone] (home)     All above HIPAA information was verified with patient.     Was a Nurse, learning disability used for this call? No    Specialty Medication(s):   Inflammatory Disorders: Dupixent     Current Outpatient Medications   Medication Sig Dispense Refill   ??? betamethasone dipropionate (DIPROLENE) 0.05 % ointment betamethasone dipropionate 0.05 % topical ointment     ??? betamethasone, augmented, (DIPROLENE) 0.05 % cream Apply twice daily to areas of eczema until flat and smooth. Avoid face and skin folds 50 g 5   ??? clobetasoL (TEMOVATE) 0.05 % ointment Apply to eczema on hands at night until resolved. 60 g 5   ??? dupilumab (DUPIXENT SYRINGE) 300 mg/2 mL Syrg injection Inject the contents of 1 syringe (300 mg total) under the skin every fourteen (14) days. 4 mL 5   ??? empty container Misc Use as directed to dispose of Dupixent syringes. 1 each 2   ??? fexofenadine (ALLEGRA) 180 MG tablet Take 180 mg by mouth daily.  11   ??? ibuprofen (ADVIL,MOTRIN) 800 MG tablet TAKE 1 TABLET BY MOUTH EVERY 8 HOURS AS NEEDED FOR MODERATE PAIN  0   ??? meloxicam (MOBIC) 15 MG tablet Mobic 15 mg tablet   Take 1 tablet(s) every day by oral route.     ??? nystatin (MYCOSTATIN) 100,000 unit/gram ointment Apply 1 application topically Two (2) times a day. To scaly areas on feet and between toes 30 g 5   ??? nystatin (MYCOSTATIN) powder      ??? ondansetron (ZOFRAN) 4 MG tablet Take 4 mg by mouth.     ??? oxyCODONE (ROXICODONE) 5 MG immediate release tablet Take 1 tablet (5 mg total) by mouth every four (4) hours as needed for pain for up to 12 doses. 12 tablet 0   ??? PARoxetine (PAXIL) 40 MG tablet      ??? TRUEPLUS PEN NEEDLE 31 gauge x 1/4 (6 mm) Ndle      ??? VICTOZA 2-PAK 0.6 mg/0.1 mL (18 mg/3 mL) injection        No current facility-administered medications for this visit.        Changes to medications: Brooksie reports no changes at this time.    Allergies   Allergen Reactions   ??? Sulfacetamide Sodium Hives and Rash   ??? Sulfamethoxazole Rash       Changes to allergies: No    SPECIALTY MEDICATION ADHERENCE     Dupixent - 0 left  Medication Adherence    Patient reported X missed doses in the last month: 0  Specialty Medication: Dupixent  Patient is on additional specialty medications: No          Specialty medication(s) dose(s) confirmed: Regimen is correct and unchanged.     Are there any concerns with adherence? No    Adherence counseling provided? Not needed    CLINICAL MANAGEMENT AND INTERVENTION      Clinical Benefit Assessment:    Do you feel the medicine is effective or helping your condition? Yes    Clinical Benefit counseling provided? Not needed    Adverse  Effects Assessment:    Are you experiencing any side effects? No    Are you experiencing difficulty administering your medicine? No    Quality of Life Assessment:    How many days over the past month did your AD  keep you from your normal activities? For example, brushing your teeth or getting up in the morning. 0    Have you discussed this with your provider? Not needed    Acute Infection Status:    Acute infections noted within Epic:  No active infections    Patient reported infection: None    Therapy Appropriateness:    Is therapy appropriate? Yes, therapy is appropriate and should be continued    DISEASE/MEDICATION-SPECIFIC INFORMATION      For patients on injectable medications: Patient currently has 0 doses left.  Next injection is scheduled for 5/25.    PATIENT SPECIFIC NEEDS     - Does the patient have any physical, cognitive, or cultural barriers? No    - Is the patient high risk? No     - Does the patient require a Care Management Plan? No     - Does the patient require physician intervention or other additional services (i.e. nutrition, smoking cessation, social work)? No      SHIPPING     Specialty Medication(s) to be Shipped:   Inflammatory Disorders: Dupixent    Other medication(s) to be shipped: na     Changes to insurance: No    Delivery Scheduled: Yes, Expected medication delivery date: Thurs, 5/19.     Medication will be delivered via Same Day Courier to the confirmed prescription address in [facility].    The patient will receive a drug information handout for each medication shipped and additional FDA Medication Guides as required.  Verified that patient has previously received a Conservation officer, historic buildings.    All of the patient's questions and concerns have been addressed.    Lanney Gins   [facility] Specialty Pharmacist

## 2021-04-14 ENCOUNTER — Emergency Department
Admit: 2021-04-14 | Discharge: 2021-04-14 | Disposition: A | Discharge status: Home / Self Care | Payer: MEDICARE | Attending: Emergency Medicine

## 2021-04-14 ENCOUNTER — Ambulatory Visit
Admit: 2021-04-14 | Discharge: 2021-04-14 | Disposition: A | Discharge status: Home / Self Care | Payer: MEDICARE | Attending: Emergency Medicine

## 2021-04-14 DIAGNOSIS — J209 Acute bronchitis, unspecified: Principal | ICD-10-CM

## 2021-04-14 DIAGNOSIS — R059 Cough: Principal | ICD-10-CM

## 2021-04-14 MED ORDER — BENZONATATE 100 MG CAPSULE
ORAL_CAPSULE | Freq: Three times a day (TID) | ORAL | 0 refills | 7 days | Status: CP
Start: 2021-04-14 — End: 2021-04-21

## 2021-04-14 MED ORDER — GUAIFENESIN 200 MG TABLET
ORAL_TABLET | Freq: Four times a day (QID) | ORAL | 0 refills | 4 days | Status: CP | PRN
Start: 2021-04-14 — End: ?

## 2021-04-14 MED ADMIN — albuterol (PROVENTIL HFA;VENTOLIN HFA) 90 mcg/actuation inhaler 2 puff: 2 | RESPIRATORY_TRACT | @ 14:00:00 | Stop: 2021-04-14

## 2021-04-14 MED ADMIN — ondansetron (ZOFRAN-ODT) disintegrating tablet 4 mg: 4 mg | ORAL | @ 13:00:00 | Stop: 2021-04-14

## 2021-04-14 MED ADMIN — inhalational spacing device Spcr 1 each: 1 | RESPIRATORY_TRACT | @ 14:00:00 | Stop: 2021-04-14

## 2021-04-14 MED ADMIN — ibuprofen (MOTRIN) tablet 600 mg: 600 mg | ORAL | @ 13:00:00 | Stop: 2021-04-14

## 2021-04-14 NOTE — Unmapped (Signed)
Pt reports productive cough since last night. Pt reports chest discomfort when cough. Pt with no other symptoms or concerns

## 2021-04-14 NOTE — Unmapped (Signed)
[facility]  Emergency Department Provider Note      ED Clinical Impression     Final diagnoses:   None       Initial Impression, ED Course, Assessment and Plan     Patient is a 35 y.o. female with PMH of multiple ear surgeries presenting for 5 days of rhinorrhea and one day of productive cough with 2 episodes of post tussive emesis.     On exam, the patient appears well and is in NAD. Vital signs are are overall reassuring with exception of tachycardia. Physical exam is notable for purulent rhinorrhea with mild tachycardia to 104. Lungs are clear in all sections. Heart is regular and tachycardic to 104 with no murmurs, rubs, or gallops.      Differential includes superimposed viral illness continuing course of sinusitis vs. Viral illness. Will obtain CXR for pneumonia unlikely to represent PE given purulent nasal drainage and known sick contact. The patient has no chest pain or palpitations making ACS also unlikely.     Plan for CXR and rapid influenza/RSV/C. Will give also give Zofran PO and push oral fluids to make sure she can tolerate PO. The plan was discussed with the patient and she is in agreement.      Tolerated PO. Likely bronchitis. Given inhaler, tessalon, and guaifenesin with close follow up.    ED Course as of 04/14/21 1905   Mon Apr 14, 2021   2956 XR Chest Portable  IMPRESSION:  ??  No acute airspace disease.           Additional Medical Decision Making     I have reviewed the vital signs and the nursing notes. Labs and radiology results that were available during my care of the patient were independently reviewed by me and considered in my medical decision making.     I staffed the case with the ED attending, Dr. Leonette Monarch.    I independently visualized the radiology images.    Portions of this record have been created using Scientist, clinical (histocompatibility and immunogenetics). Dictation errors have been sought, but may not have been identified and corrected.  ____________________________________________       History     Chief Complaint  No chief complaint on file.      HPI   Samantha Miles is a 35 y.o. female with a PMH of multiple ear surgeries presenting to the ED for evaluation of cough. The patient has had rhinorrhea since Wednesday (5 days ago). She was seen by her PCP the same day and was started on Augmentin for a presumed sinus infection. Yesterday, she developed a productive cough with yellow and green sputum. The cough has made it difficult to keep fluids down and has led to 2 episodes of post-tussive emesis. Of note, the patient's daughter is also sick with a flu like illness. The patient denies any chest pain, shortness of breath, fever, or diarrhea.         Past Medical History:   Diagnosis Date   ??? Depression    ??? Eczema        There is no problem list on file for this patient.      Past Surgical History:   Procedure Laterality Date   ??? INNER EAR SURGERY     ??? MIDDLE EAR SURGERY     ??? TUBAL LIGATION         No current facility-administered medications for this encounter.    Current Outpatient Medications:   ???  betamethasone dipropionate (DIPROLENE) 0.05 % ointment, betamethasone dipropionate 0.05 % topical ointment, Disp: , Rfl:   ???  betamethasone, augmented, (DIPROLENE) 0.05 % cream, Apply twice daily to areas of eczema until flat and smooth. Avoid face and skin folds, Disp: 50 g, Rfl: 5  ???  clobetasoL (TEMOVATE) 0.05 % ointment, Apply to eczema on hands at night until resolved., Disp: 60 g, Rfl: 5  ???  dupilumab (DUPIXENT SYRINGE) 300 mg/2 mL Syrg injection, Inject the contents of 1 syringe (300 mg total) under the skin every fourteen (14) days., Disp: 4 mL, Rfl: 5  ???  empty container Misc, Use as directed to dispose of Dupixent syringes., Disp: 1 each, Rfl: 2  ???  fexofenadine (ALLEGRA) 180 MG tablet, Take 180 mg by mouth daily., Disp: , Rfl: 11  ???  ibuprofen (ADVIL,MOTRIN) 800 MG tablet, TAKE 1 TABLET BY MOUTH EVERY 8 HOURS AS NEEDED FOR MODERATE PAIN, Disp: , Rfl: 0  ???  meloxicam (MOBIC) 15 MG tablet, Mobic 15 mg tablet  Take 1 tablet(s) every day by oral route., Disp: , Rfl:   ???  nystatin (MYCOSTATIN) 100,000 unit/gram ointment, Apply 1 application topically Two (2) times a day. To scaly areas on feet and between toes, Disp: 30 g, Rfl: 5  ???  nystatin (MYCOSTATIN) powder, , Disp: , Rfl:   ???  ondansetron (ZOFRAN) 4 MG tablet, Take 4 mg by mouth., Disp: , Rfl:   ???  oxyCODONE (ROXICODONE) 5 MG immediate release tablet, Take 1 tablet (5 mg total) by mouth every four (4) hours as needed for pain for up to 12 doses., Disp: 12 tablet, Rfl: 0  ???  PARoxetine (PAXIL) 40 MG tablet, , Disp: , Rfl:   ???  TRUEPLUS PEN NEEDLE 31 gauge x 1/4 (6 mm) Ndle, , Disp: , Rfl:   ???  VICTOZA 2-PAK 0.6 mg/0.1 mL (18 mg/3 mL) injection, , Disp: , Rfl:     Allergies  Sulfacetamide sodium and Sulfamethoxazole    Family History   Problem Relation Age of Onset   ??? Multiple sclerosis Mother    ??? Melanoma Neg Hx    ??? Basal cell carcinoma Neg Hx    ??? Squamous cell carcinoma Neg Hx        Social History  Social History     Tobacco Use   ??? Smoking status: Never Smoker   ??? Smokeless tobacco: Never Used   Substance Use Topics   ??? Alcohol use: No     Alcohol/week: 0.0 standard drinks   ??? Drug use: No       Review of Systems     Constitutional: Negative for fever.  Eyes: Negative for visual changes.  ENT: Positive for rhinorrhea. Negative for sore throat.  Cardiovascular: Negative for chest pain.  Respiratory: Positive for cough. Negative for shortness of breath.  Gastrointestinal: Negative for abdominal pain or diarrhea.  Genitourinary: Negative for dysuria.   Musculoskeletal: Negative for back pain.  Skin: Negative for rash.  Neurological: Negative for headaches, focal weakness or numbness.      Physical Exam     ED Triage Vitals [04/14/21 0818]   Enc Vitals Group      BP 154/107      Pulse       SpO2 Pulse 104      Resp 22      Temp 36.5 ??C (97.7 ??F)      Temp Source Oral      SpO2 95 %  Constitutional: Alert and oriented. Well appearing and in no distress.  Eyes: Conjunctivae are normal.  ENT       Head: Normocephalic and atraumatic.       Nose: Purulent rhinorrhea.       Mouth/Throat: Mucous membranes are moist.       Neck: No stridor.  Hematological/Lymphatic/Immunilogical: No cervical lymphadenopathy.  Cardiovascular: Heart is regular and tachycardic to 104 with no murmurs, rubs, or gallops. Normal and symmetric distal pulses are present in all extremities.  Respiratory: Lungs are clear in all sections. Normal respiratory effort. Breath sounds are normal.  Gastrointestinal: Soft and nontender. There is no CVA tenderness.  Genitourinary:   Musculoskeletal: Normal range of motion in all extremities.       Right lower leg: No tenderness or edema.       Left lower leg: No tenderness or edema.  Neurologic: Normal speech and language. No gross focal neurologic deficits are appreciated.  Skin: Skin is warm, dry and intact. No rash noted.  Psychiatric: Mood and affect are normal. Speech and behavior are normal.          EKG     N/A    Radiology     XR Chest Portable   Final Result      No acute airspace disease.              Procedures   N/A    ______________________________________________________     Documentation assistance was provided by Graylon Gunning, Scribe, on Apr 14, 2021 at 8:17 AM for Larina Earthly, MD.        Documentation assistance was provided by the scribe in my presence.  The documentation recorded by the scribe has been reviewed by me and accurately reflects the services I personally performed.         Sanjuana Kava., MD  Resident  04/14/21 [phone]

## 2021-04-17 MED FILL — DUPIXENT 300 MG/2 ML SUBCUTANEOUS SYRINGE: SUBCUTANEOUS | 28 days supply | Qty: 4 | Fill #2

## 2021-05-09 NOTE — Unmapped (Signed)
[facility] Refill Coordination Note    Specialty Medication(s) to be Shipped:   Inflammatory Disorders: Dupixent    Other medication(s) to be shipped: No additional medications requested for fill at this time     Samantha Miles, DOB: 1986/07/01  Phone: [phone] (home)       All above HIPAA information was verified with patient.     Was a Nurse, learning disability used for this call? No    Completed refill call assessment today to schedule patient's medication shipment from the [facility] [phone]).  All relevant notes have been reviewed.     Specialty medication(s) and dose(s) confirmed: Regimen is correct and unchanged.   Changes to medications: Kairi reports no changes at this time.  Changes to insurance: No  New side effects reported not previously addressed with a pharmacist or physician: None reported  Questions for the pharmacist: No    Confirmed patient received a Conservation officer, historic buildings and a Surveyor, mining with first shipment. The patient will receive a drug information handout for each medication shipped and additional FDA Medication Guides as required.       DISEASE/MEDICATION-SPECIFIC INFORMATION        For patients on injectable medications: Patient currently has 1 doses left.  Next injection is scheduled for 05/14/2021.    SPECIALTY MEDICATION ADHERENCE     Medication Adherence    Patient reported X missed doses in the last month: 0  Specialty Medication: Dupixent 300 mg/2 ml  Patient is on additional specialty medications: No  Any gaps in refill history greater than 2 weeks in the last 3 months: no  Demonstrates understanding of importance of adherence: yes  Informant: patient  Reliability of informant: reliable  Confirmed plan for next specialty medication refill: delivery by pharmacy  Refills needed for supportive medications: not needed              Were doses missed due to medication being on hold? No    Dupixent 300 mg/2 ml mg/ml: 14 days of medicine on hand        REFERRAL TO PHARMACIST     Referral to the pharmacist: Not needed      [facility]     Shipping address confirmed in Epic.     Delivery Scheduled: Yes, Expected medication delivery date: 05/19/2021.     Medication will be delivered via Same Day Courier to the prescription address in Epic WAM.    Aury Scollard D [name] Dake   [facility] Specialty Technician

## 2021-05-19 MED FILL — DUPIXENT 300 MG/2 ML SUBCUTANEOUS SYRINGE: SUBCUTANEOUS | 28 days supply | Qty: 4 | Fill #3

## 2021-06-09 NOTE — Unmapped (Signed)
[facility] Refill Coordination Note    Specialty Medication(s) to be Shipped:   Inflammatory Disorders: Dupixent    Other medication(s) to be shipped: No additional medications requested for fill at this time     Samantha Miles, DOB: Feb 03, 1986  Phone: [phone] (home)       All above HIPAA information was verified with patient.     Was a Nurse, learning disability used for this call? No    Completed refill call assessment today to schedule patient's medication shipment from the [facility] [phone]).  All relevant notes have been reviewed.     Specialty medication(s) and dose(s) confirmed: Regimen is correct and unchanged.   Changes to medications: Samantha Miles reports no changes at this time.  Changes to insurance: No  New side effects reported not previously addressed with a pharmacist or physician: None reported  Questions for the pharmacist: No    Confirmed patient received a Conservation officer, historic buildings and a Surveyor, mining with first shipment. The patient will receive a drug information handout for each medication shipped and additional FDA Medication Guides as required.       DISEASE/MEDICATION-SPECIFIC INFORMATION        For patients on injectable medications: Patient currently has 1 doses left.  Next injection is scheduled for 06/11/2021.    SPECIALTY MEDICATION ADHERENCE     Medication Adherence    Patient reported X missed doses in the last month: 0  Specialty Medication: Dupixent 300 mg/2 ml  Patient is on additional specialty medications: No  Any gaps in refill history greater than 2 weeks in the last 3 months: no  Demonstrates understanding of importance of adherence: yes  Informant: patient  Reliability of informant: reliable  Confirmed plan for next specialty medication refill: delivery by pharmacy  Refills needed for supportive medications: not needed              Were doses missed due to medication being on hold? No    Dupixent 300 mg/2 ml mg/ml: 14 days of medicine on hand        REFERRAL TO PHARMACIST     Referral to the pharmacist: Not needed      [facility]     Shipping address confirmed in Epic.     Delivery Scheduled: Yes, Expected medication delivery date: 06/13/2021.     Medication will be delivered via Same Day Courier to the prescription address in Epic WAM.    Marin Wisner D Damaris Abeln   [facility] Specialty Technician

## 2021-06-10 ENCOUNTER — Encounter: Payer: Self-pay | Admitting: Podiatry

## 2021-06-10 ENCOUNTER — Ambulatory Visit (INDEPENDENT_AMBULATORY_CARE_PROVIDER_SITE_OTHER): Payer: Medicare HMO | Admitting: Podiatry

## 2021-06-10 ENCOUNTER — Other Ambulatory Visit: Payer: Self-pay

## 2021-06-10 DIAGNOSIS — L988 Other specified disorders of the skin and subcutaneous tissue: Secondary | ICD-10-CM

## 2021-06-10 DIAGNOSIS — L081 Erythrasma: Secondary | ICD-10-CM

## 2021-06-10 MED ORDER — ERYTHROMYCIN 2 % EX PADS: 1.0000 "application " | MEDICATED_PAD | Freq: Every day | CUTANEOUS | 0 refills | Status: DC

## 2021-06-10 NOTE — Progress Notes (Signed)
r  Subjective:  Patient ID: Brittney Sanders, female    DOB: 19-Sep-1986,  MRN: [id number]  Chief Complaint  Patient presents with   Nail Problem    PT stated that she has dry skin and she will scratch in between her 2/3rd toes on her left foot because it itches she stated that she has pain in the 2nd toe when she bends it     35 y.o. female presents with the above complaint.  Patient presents with complaint of left second and third interdigital space maceration/itching.  Patient states that has been going for quite some time is progressive gotten worse.  Hurts pretty much in between all of her toes.  It is mostly itching.  No ulceration noted.  She would like to get it evaluated she has not seen anyone else prior to seeing me.  She denies any other acute complaint she is tried some over-the-counter medication none of which has helped.  She has not taken any antibiotics or antifungal.    Review of Systems: Negative except as noted in the HPI. Denies N/V/F/Ch.  Past Medical History:  Diagnosis Date   Depression    Eczema    Hearing deficit    Heel spur    Kidney stone    Left nephrolithiasis    Nocturia    Obesity    Overweight    Plantar fasciitis    Seasonal allergies    UTI (lower urinary tract infection)     Current Outpatient Medications:    Erythromycin 2 % PADS, Apply 1 application topically daily., Disp: 60 each, Rfl: 0   DUPIXENT 300 MG/2ML prefilled syringe, , Disp: , Rfl:    famotidine (PEPCID) 20 MG tablet, Take 1 tablet (20 mg total) by mouth 2 (two) times daily., Disp: 60 tablet, Rfl: 0   fexofenadine (ALLEGRA) 180 MG tablet, Take 180 mg by mouth daily., Disp: , Rfl:    meloxicam (MOBIC) 15 MG tablet, Take 15 mg by mouth daily., Disp: , Rfl:    metoCLOPramide (REGLAN) 10 MG tablet, Take 1 tablet (10 mg total) by mouth every 6 (six) hours as needed., Disp: 30 tablet, Rfl: 0   naproxen (NAPROSYN) 500 MG tablet, Take 1 tablet (500 mg total) by mouth 2 (two) times daily  with a meal., Disp: 20 tablet, Rfl: 0   nortriptyline (PAMELOR) 10 MG capsule, Increase nortriptyline to 30 mg nightly for two weeks, then increase to 40 mg nightly, refilled., Disp: , Rfl:    nystatin (MYCOSTATIN/NYSTOP) powder, , Disp: , Rfl:    ondansetron (ZOFRAN ODT) 4 MG disintegrating tablet, Take 1 tablet (4 mg total) by mouth every 8 (eight) hours as needed for nausea or vomiting., Disp: 15 tablet, Rfl: 0   ondansetron (ZOFRAN) 4 MG tablet, Take 1 tablet (4 mg total) by mouth every 8 (eight) hours as needed for nausea or vomiting., Disp: 8 tablet, Rfl: 0   PARoxetine (PAXIL) 40 MG tablet, Take 40 mg by mouth 2 (two) times a day., Disp: , Rfl:   Social History   Tobacco Use  Smoking Status Never  Smokeless Tobacco Never    Allergies  Allergen Reactions   Sulfa Antibiotics Hives and Rash   Sulfamethoxazole Rash   Objective:  There were no vitals filed for this visit. There is no height or weight on file to calculate BMI. Constitutional Well developed. Well nourished.  Vascular Dorsalis pedis pulses palpable bilaterally. Posterior tibial pulses palpable bilaterally. Capillary refill normal to all digits.  No cyanosis or clubbing noted. Pedal Nance growth normal.  Neurologic Normal speech. Oriented to person, place, and time. Epicritic sensation to light touch grossly present bilaterally.  Dermatologic Epidermal lysis with subjective component of itching noted to interdigital space with worsening left second and third interdigital space.  No plantar athlete's foot noted.  Mild maceration noted in between the toes  Orthopedic: Normal joint ROM without pain or crepitus bilaterally. No visible deformities. No bony tenderness.   Radiographs: None Assessment:   1. Erythrasma   2. Maceration of skin    Plan:  Patient was evaluated and treated and all questions answered.  Left second and third interdigital space erythrasma versus tinea pedis -I explained to the patient the  etiology of interspace erythrasma versus treatment options were discussed.  This is likely bacterial in nature.  Patient does not have any underlying skin breakdown.  I believe patient will benefit from erythromycin pad if there is no improvement we will discuss Betadine wet-to-dry versus antifungal.  I discussed this with the patient extensive detail she states understanding. -Peripads were sent to the pharmacy  No follow-ups on file.

## 2021-06-12 ENCOUNTER — Emergency Department: Admit: 2021-06-12 | Discharge: 2021-06-12 | Disposition: A | Discharge status: Home / Self Care | Payer: MEDICARE

## 2021-06-12 ENCOUNTER — Ambulatory Visit: Admit: 2021-06-12 | Discharge: 2021-06-12 | Disposition: A | Discharge status: Home / Self Care | Payer: MEDICARE

## 2021-06-12 DIAGNOSIS — R109 Unspecified abdominal pain: Principal | ICD-10-CM

## 2021-06-12 DIAGNOSIS — N390 Urinary tract infection, site not specified: Principal | ICD-10-CM

## 2021-06-12 MED ORDER — CEFDINIR 300 MG CAPSULE
ORAL_CAPSULE | Freq: Every day | ORAL | 0 refills | 10.00000 days | Status: CP
Start: 2021-06-12 — End: 2021-06-22

## 2021-06-12 MED ADMIN — lactated ringers bolus 1,000 mL: 1000 mL | INTRAVENOUS | @ 14:00:00 | Stop: 2021-06-12

## 2021-06-12 MED ADMIN — cefTRIAXone (ROCEPHIN) 1 g in sodium chloride 0.9 % (NS) 100 mL IVPB-connector bag: 1 g | INTRAVENOUS | @ 16:00:00 | Stop: 2021-06-12

## 2021-06-12 MED ADMIN — ketorolac (TORADOL) injection 15 mg: 15 mg | INTRAVENOUS | @ 12:00:00 | Stop: 2021-06-12

## 2021-06-12 MED ADMIN — lactated ringers bolus 1,000 mL: 1000 mL | INTRAVENOUS | @ 12:00:00 | Stop: 2021-06-12

## 2021-06-12 NOTE — Unmapped (Signed)
[facility]  Emergency Department Provider Note       ED Clinical Impression     Final diagnoses:   Abdominal pain, unspecified abdominal location (Primary)   Urinary tract infection without hematuria, site unspecified        Impression, Medical Decision Making, ED Course     Impression: 35 y.o. female with self-reported past history of nephrolithiasis and urinary tract infection who presents with concerns for left-sided abdominal cramping as described below.  Pain started yesterday at approximately 5 AM, constant, worse with movement, located in bilateral lower quadrants, radiating across bilateral flanks, left greater than right.  Decreased p.o. intake and nausea, no emesis.  Increased bowel movement frequency yesterday, no diarrhea/no constipation.  Denies any vaginal symptoms.  Increased urinary frequency, no burning, no urgency.  Patient is unsure if the symptoms are similar to past episodes of nephrolithiasis versus urinary tract infection.    BP 136/99  - Pulse 85  - Temp 37.3 ??C (99.1 ??F) (Oral)  - Resp 18  - SpO2 99%     Initial vitals as above, all stable.  Upon initial evaluation in the emergency department, patient is non-toxic appearing.  Physical exam is notable for mild bilateral lower quadrant tenderness palpation, mild left CVA tenderness.  No rebound, no guarding.  Otherwise unremarkable physical exam.    DDx/MDM: Suspect urinary tract infection/pyelonephritis versus nephrolithiasis.  Not clinically consistent with appendicitis or GU pathology such as TOA, ovarian torsion.    Diagnostic workup as below, CT scan to evaluate for nephrolithiasis. Will treat patient with IV fluids, Toradol.    Orders Placed This Encounter   Procedures    Urine Culture    CT Abdomen Pelvis Wo Contrast    CBC w/ Differential    Comprehensive metabolic panel    Lipase    Urinalysis with Culture Reflex    Pregnancy, urine    Measure post void residual       ED Course as of 06/12/21 1333   Thu Jun 12, 2021   0920 Lipase, CMP, CBC all unremarkable.   1116 Leukocyte Esterase, UA(!): Large  Urine with large leukocyte esterase, moderate bacteria, WBCs, suspect urinary tract infection.  We will plan for dose of ceftriaxone here in the emergency department.   1156 CT scan with bilateral nephrolithiasis without hydronephrosis.   1203 Discussed CT scan results with patient as well as plan for discharge with antibiotics and return precautions.  Patient is okay for discharge.       ____________________________________________    The case was discussed with Dr. Kandis Nab*, who is in agreement with the above assessment and plan.    Dictation software was used while making this note. Please excuse any errors made with dictation software.     Additional Medical Decision Making     I have reviewed the vital signs and the nursing notes. Labs and radiology results that were available during my care of the patient were independently reviewed by me and considered in my medical decision making.     I independently visualized the EKG tracing if performed.  I independently visualized the radiology images if performed.  I reviewed the patient's prior medical records if available.  Additional history obtained from family if available.  I discussed the case with the admitting provider and the consulting services if the patient was admitted and/or consulting services were utilized.     History     Chief Complaint  Chief Complaint   Patient presents with  Abdominal Pain       HPI   Samantha Miles is a 35 y.o. female with self-reported past history of nephrolithiasis and urinary tract infection who presents with concerns for left-sided abdominal cramping as described below.  Pain started yesterday at approximately 5 AM, constant, worse with movement, located in bilateral lower quadrants, radiating across bilateral flanks, left greater than right.  Decreased p.o. intake and nausea, no emesis.  Increased bowel movement frequency yesterday, no diarrhea/no constipation.  Denies any vaginal symptoms.  Increased urinary frequency, no burning, no urgency.  Patient is unsure if the symptoms are similar to past episodes of nephrolithiasis versus urinary tract infection.    ROS:   Constitutional: Negative for fever.  Eyes: Negative for visual changes.  ENT: Negative for sore throat.  Cardiovascular: Negative for palpitations. Negative for chest pain.  Respiratory: Negative for shortness of breath.  Gastrointestinal:  Positive for abdominal pain.  Negative for vomiting or diarrhea.  Genitourinary: Negative for dysuria.   Musculoskeletal: Negative for back pain.  Skin: Negative for rash.  Neurological: Negative for headaches, focal weakness, or numbness.     All other systems have been reviewed and are negative except as otherwise documented.    Past Medical History:   Diagnosis Date    Depression     Eczema        Past Surgical History:   Procedure Laterality Date    INNER EAR SURGERY      MIDDLE EAR SURGERY      TUBAL LIGATION         No current facility-administered medications for this encounter.    Current Outpatient Medications:     betamethasone dipropionate (DIPROLENE) 0.05 % ointment, betamethasone dipropionate 0.05 % topical ointment, Disp: , Rfl:     betamethasone, augmented, (DIPROLENE) 0.05 % cream, Apply twice daily to areas of eczema until flat and smooth. Avoid face and skin folds, Disp: 50 g, Rfl: 5    cefdinir (OMNICEF) 300 MG capsule, Take 2 capsules (600 mg total) by mouth in the morning for 10 days., Disp: 20 capsule, Rfl: 0    clobetasoL (TEMOVATE) 0.05 % ointment, Apply to eczema on hands at night until resolved., Disp: 60 g, Rfl: 5    dupilumab (DUPIXENT SYRINGE) 300 mg/2 mL Syrg injection, Inject the contents of 1 syringe (300 mg total) under the skin every fourteen (14) days., Disp: 4 mL, Rfl: 5    empty container Misc, Use as directed to dispose of Dupixent syringes., Disp: 1 each, Rfl: 2    fexofenadine (ALLEGRA) 180 MG tablet, Take 180 mg by mouth daily., Disp: , Rfl: 11    guaiFENesin 200 mg tablet, Take 2 tablets (400 mg total) by mouth every six (6) hours as needed for congestion., Disp: 30 tablet, Rfl: 0    ibuprofen (ADVIL,MOTRIN) 800 MG tablet, TAKE 1 TABLET BY MOUTH EVERY 8 HOURS AS NEEDED FOR MODERATE PAIN, Disp: , Rfl: 0    meloxicam (MOBIC) 15 MG tablet, Mobic 15 mg tablet  Take 1 tablet(s) every day by oral route., Disp: , Rfl:     nystatin (MYCOSTATIN) 100,000 unit/gram ointment, Apply 1 application topically Two (2) times a day. To scaly areas on feet and between toes, Disp: 30 g, Rfl: 5    nystatin (MYCOSTATIN) powder, , Disp: , Rfl:     ondansetron (ZOFRAN) 4 MG tablet, Take 4 mg by mouth., Disp: , Rfl:     oxyCODONE (ROXICODONE) 5 MG immediate release tablet, Take 1 tablet (  5 mg total) by mouth every four (4) hours as needed for pain for up to 12 doses., Disp: 12 tablet, Rfl: 0    PARoxetine (PAXIL) 40 MG tablet, , Disp: , Rfl:     TRUEPLUS PEN NEEDLE 31 gauge x 1/4 (6 mm) Ndle, , Disp: , Rfl:     VICTOZA 2-PAK 0.6 mg/0.1 mL (18 mg/3 mL) injection, , Disp: , Rfl:     Allergies  Sulfacetamide sodium and Sulfamethoxazole    Family History   Problem Relation Age of Onset    Multiple sclerosis Mother     Melanoma Neg Hx     Basal cell carcinoma Neg Hx     Squamous cell carcinoma Neg Hx        Social History  Social History     Tobacco Use    Smoking status: Never Smoker    Smokeless tobacco: Never Used   Substance Use Topics    Alcohol use: No     Alcohol/week: 0.0 standard drinks    Drug use: No        Physical Exam     VITAL SIGNS:      Vitals:    06/12/21 0646 06/12/21 1111 06/12/21 1255   BP: 136/99 136/102 120/89   Pulse: 85 78 84   Resp: 18 18 18    Temp: 37.3 ??C (99.1 ??F)  36.9 ??C (98.5 ??F)   TempSrc: Oral  Oral   SpO2: 99% 95% 97%       Constitutional: Alert and oriented. Well appearing and in no distress.  Eyes: Conjunctivae are normal.  HEENT: Normocephalic and atraumatic. Conjunctivae clear. No congestion. Moist mucous membranes.   Cardiovascular: Normal rate, regular rhythm. Normal and symmetric distal pulses. Brisk capillary refill. Normal skin turgor.  Respiratory: Normal respiratory effort. Breath sounds are normal. There are no wheezing or crackles heard.  Gastrointestinal: Soft, non-distended, mild bilateral lower quadrant tenderness palpation.  Mild left CVA tenderness.  Genitourinary: Deferred.  Musculoskeletal: Non-tender with normal range of motion in all extremities.       Right lower leg: No tenderness or edema.       Left lower leg: No tenderness or edema.  Neurologic: Normal speech and language. No gross focal neurologic deficits are appreciated. Patient is moving all extremities equally, face is symmetric at rest and with speech.  Skin: Skin is warm, dry and intact. No rash noted.  Psychiatric: Mood and affect are normal. Speech and behavior are normal.     Radiology     CT Abdomen Pelvis Wo Contrast   Final Result      1. Bilateral nephrolithiasis without hydronephrosis.      2. Hepatic steatosis.           Laboratory Data     Lab Results   Component Value Date    WBC 8.9 06/12/2021    HGB 14.0 06/12/2021    HCT 40.9 06/12/2021    PLT 339 06/12/2021       Lab Results   Component Value Date    NA 135 06/12/2021    K 3.4 06/12/2021    CL 100 06/12/2021    CO2 28.0 06/12/2021    BUN 9 06/12/2021    CREATININE 0.82 (H) 06/12/2021    GLU 127 06/12/2021    CALCIUM 9.8 06/12/2021       Lab Results   Component Value Date    BILITOT 0.5 06/12/2021    PROT 6.8 06/12/2021    ALBUMIN 3.8 06/12/2021  ALT 52 (H) 06/12/2021    AST 52 (H) 06/12/2021    ALKPHOS 76 06/12/2021       No results found for: LABPROT, INR, APTT    Pertinent labs & imaging results that were available during my care of the patient were reviewed by me and considered in my medical decision making (see chart for details).    Portions of this record have been created using Scientist, clinical (histocompatibility and immunogenetics). Dictation errors have been sought, but may not have been identified and corrected.     Jesse Fall, MD  Resident  06/12/21 [phone]

## 2021-06-12 NOTE — Unmapped (Signed)
Pt c/o abdominal cramping that started yesterday,  worse when laying down.

## 2021-06-13 MED FILL — DUPIXENT 300 MG/2 ML SUBCUTANEOUS SYRINGE: SUBCUTANEOUS | 28 days supply | Qty: 4 | Fill #4

## 2021-07-04 NOTE — Unmapped (Signed)
[facility] Refill Coordination Note    Specialty Medication(s) to be Shipped:   Inflammatory Disorders: Dupixent    Other medication(s) to be shipped: No additional medications requested for fill at this time     Samantha Miles, DOB: 1986/06/26  Phone: [phone] (home)       All above HIPAA information was verified with patient.     Was a Nurse, learning disability used for this call? No    Completed refill call assessment today to schedule patient's medication shipment from the [facility] [phone]).  All relevant notes have been reviewed.     Specialty medication(s) and dose(s) confirmed: Regimen is correct and unchanged.   Changes to medications: Daphane reports no changes at this time.  Changes to insurance: No  New side effects reported not previously addressed with a pharmacist or physician: None reported  Questions for the pharmacist: No    Confirmed patient received a Conservation officer, historic buildings and a Surveyor, mining with first shipment. The patient will receive a drug information handout for each medication shipped and additional FDA Medication Guides as required.       DISEASE/MEDICATION-SPECIFIC INFORMATION        For patients on injectable medications: Patient currently has 1 doses left.  Next injection is scheduled for 07/13/2021.    SPECIALTY MEDICATION ADHERENCE     Medication Adherence    Patient reported X missed doses in the last month: 0  Specialty Medication: Dupixent 300 mg/2 ml  Patient is on additional specialty medications: No  Any gaps in refill history greater than 2 weeks in the last 3 months: no  Demonstrates understanding of importance of adherence: yes  Informant: patient  Reliability of informant: reliable  Confirmed plan for next specialty medication refill: delivery by pharmacy  Refills needed for supportive medications: not needed              Were doses missed due to medication being on hold? No    Dupixent 300 mg/2 ml mg/ml: 14 days of medicine on hand        REFERRAL TO PHARMACIST     Referral to the pharmacist: Not needed      [facility]     Shipping address confirmed in Epic.     Delivery Scheduled: Yes, Expected medication delivery date: 07/11/2021.     Medication will be delivered via Same Day Courier to the prescription address in Epic WAM.    Latoi Giraldo D Shaunika Italiano   [facility] Specialty Technician

## 2021-07-11 MED FILL — DUPIXENT 300 MG/2 ML SUBCUTANEOUS SYRINGE: SUBCUTANEOUS | 28 days supply | Qty: 4 | Fill #5

## 2021-07-30 LAB — COMPREHENSIVE METABOLIC PANEL
ALBUMIN: 4.1 g/dL (ref 3.4–5.0)
ALKALINE PHOSPHATASE: 72 U/L (ref 46–116)
ALT (SGPT): 50 U/L — ABNORMAL HIGH (ref 10–49)
ANION GAP: 7 mmol/L (ref 5–14)
AST (SGOT): 52 U/L — ABNORMAL HIGH (ref <=34)
BILIRUBIN TOTAL: 0.4 mg/dL (ref 0.3–1.2)
BLOOD UREA NITROGEN: 7 mg/dL — ABNORMAL LOW (ref 9–23)
BUN / CREAT RATIO: 8
CALCIUM: 9.9 mg/dL (ref 8.7–10.4)
CHLORIDE: 104 mmol/L (ref 98–107)
CO2: 28.6 mmol/L (ref 20.0–31.0)
CREATININE: 0.93 mg/dL — ABNORMAL HIGH
EGFR CKD-EPI (2021) FEMALE: 83 mL/min/{1.73_m2} (ref >=60)
GLUCOSE RANDOM: 101 mg/dL (ref 70–179)
POTASSIUM: 3.2 mmol/L — ABNORMAL LOW (ref 3.4–4.8)
PROTEIN TOTAL: 7.4 g/dL (ref 5.7–8.2)
SODIUM: 140 mmol/L (ref 135–145)

## 2021-07-30 LAB — CBC W/ AUTO DIFF
BASOPHILS ABSOLUTE COUNT: 0.1 10*9/L (ref 0.0–0.1)
BASOPHILS RELATIVE PERCENT: 0.9 %
EOSINOPHILS ABSOLUTE COUNT: 0.2 10*9/L (ref 0.0–0.5)
EOSINOPHILS RELATIVE PERCENT: 1.8 %
HEMATOCRIT: 43.1 % (ref 34.0–44.0)
HEMOGLOBIN: 14.7 g/dL (ref 11.3–14.9)
LYMPHOCYTES ABSOLUTE COUNT: 3.8 10*9/L — ABNORMAL HIGH (ref 1.1–3.6)
LYMPHOCYTES RELATIVE PERCENT: 34.4 %
MEAN CORPUSCULAR HEMOGLOBIN CONC: 34.2 g/dL (ref 32.0–36.0)
MEAN CORPUSCULAR HEMOGLOBIN: 30.2 pg (ref 25.9–32.4)
MEAN CORPUSCULAR VOLUME: 88.3 fL (ref 77.6–95.7)
MEAN PLATELET VOLUME: 8.1 fL (ref 6.8–10.7)
MONOCYTES ABSOLUTE COUNT: 0.7 10*9/L (ref 0.3–0.8)
MONOCYTES RELATIVE PERCENT: 6.7 %
NEUTROPHILS ABSOLUTE COUNT: 6.2 10*9/L (ref 1.8–7.8)
NEUTROPHILS RELATIVE PERCENT: 56.2 %
NUCLEATED RED BLOOD CELLS: 0 /100{WBCs} (ref <=4)
PLATELET COUNT: 365 10*9/L (ref 150–450)
RED BLOOD CELL COUNT: 4.88 10*12/L (ref 3.95–5.13)
RED CELL DISTRIBUTION WIDTH: 13.2 % (ref 12.2–15.2)
WBC ADJUSTED: 11 10*9/L (ref 3.6–11.2)

## 2021-07-30 LAB — URINALYSIS WITH CULTURE REFLEX
BILIRUBIN UA: NEGATIVE
BLOOD UA: NEGATIVE
GLUCOSE UA: NEGATIVE
KETONES UA: NEGATIVE
NITRITE UA: NEGATIVE
PH UA: 6.5 (ref 5.0–9.0)
PROTEIN UA: NEGATIVE
RBC UA: 0 /HPF (ref <4)
SPECIFIC GRAVITY UA: 1.02 (ref 1.005–1.040)
SQUAMOUS EPITHELIAL: 3 /HPF (ref 0–5)
UROBILINOGEN UA: 0.2
WBC UA: 15 /HPF — ABNORMAL HIGH (ref 0–5)

## 2021-07-30 LAB — PREGNANCY, URINE: PREGNANCY TEST URINE: NEGATIVE

## 2021-07-31 ENCOUNTER — Ambulatory Visit: Admit: 2021-07-31 | Discharge: 2021-07-31 | Disposition: A | Discharge status: Home / Self Care | Payer: MEDICARE

## 2021-07-31 ENCOUNTER — Emergency Department: Admit: 2021-07-31 | Discharge: 2021-07-31 | Disposition: A | Discharge status: Home / Self Care | Payer: MEDICARE

## 2021-07-31 MED ADMIN — iohexoL (OMNIPAQUE) 350 mg iodine/mL solution 100 mL: 100 mL | INTRAVENOUS | @ 07:00:00 | Stop: 2021-07-31

## 2021-07-31 MED ADMIN — lidocaine (LIDODERM) 5 % patch 1 patch: 1 | TRANSDERMAL | @ 08:00:00 | Stop: 2021-07-31

## 2021-07-31 MED ADMIN — ketorolac (TORADOL) injection 30 mg: 30 mg | INTRAVENOUS | @ 06:00:00 | Stop: 2021-07-31

## 2021-07-31 NOTE — Unmapped (Signed)
[facility]  Emergency Department Provider Note       ED Clinical Impression     Final diagnoses:   Left flank pain (Primary)        Impression, Medical Decision Making, ED Course     Impression: 35 y.o. female with past medical history of nephrolithiasis and urinary tract infection who presents with concerns for left-sided flank/left lower quadrant pain.  Patient reports that she has had this pain for approximately the last 3 days, pain is described as waxing and waning, dull, located across left lower quadrant, similar to past pain that she has had with nephrolithiasis.  Has not identified anything that makes it better or worse, no fever/chills, no other associated symptoms.  No dysuria.    BP 117/78  - Pulse 78  - Temp 37.3 ??C (99.1 ??F)  - Resp 18  - Ht 160 cm (5' 3)  - Wt (!) 133.8 kg (295 lb)  - LMP 04/30/2021  - SpO2 100%  - BMI 52.26 kg/m??     Initial vitals as above, all stable.  Upon initial evaluation in the emergency department, patient is non-toxic appearing.  Physical exam slightly limited due to body habitus, however, mild left CVA tenderness and mild left lower quadrant tenderness palpation.    DDx/MDM: Diverticulitis, nephrolithiasis, urinary tract infection.  Not clinically consistent with OB/GYN pathology such as torsion, TOA given patient's lack of vaginal symptoms and description of pain.    Diagnostic workup as below, CT scan with contrast to evaluate for diverticulitis. Will treat patient with Toradol.  Disposition pending results of work-up.    Orders Placed This Encounter   Procedures   ??? Urine Culture   ??? CT Abdomen Pelvis W IV Contrast Only   ??? Comprehensive Metabolic Panel   ??? CBC w/ Differential   ??? Pregnancy Qualitative, Urine (Sent to lab)   ??? Urinalysis with Culture Reflex       ED Course as of 07/31/21 0339   Thu Jul 31, 2021   0240 Urinalysis with trace leukocytes, 15 white blood cells.  No blood.   1610 Patient with nonobstructing 4 mm left lower pole nephrolithiasis. No hydronephrosis.   9604 Reevaluated patient after administration of Toradol, patient reports feeling better.  Discussed CT scan and lack of acute pathology with patient.  Patient expressed understanding plan for discharge and return precautions, as well as follow-up with urology.  Patient is okay for discharge.       ____________________________________________    The case was discussed with Dr. Desma Mcgregor, MD, who is in agreement with the above assessment and plan.    Dictation software was used while making this note. Please excuse any errors made with dictation software.     Additional Medical Decision Making     I have reviewed the vital signs and the nursing notes. Labs and radiology results that were available during my care of the patient were independently reviewed by me and considered in my medical decision making.     I independently visualized the EKG tracing if performed.  I independently visualized the radiology images if performed.  I reviewed the patient's prior medical records if available.  Additional history obtained from family if available.  I discussed the case with the admitting provider and the consulting services if the patient was admitted and/or consulting services were utilized.     History     Chief Complaint  Chief Complaint   Patient presents with   ??? Flank  Pain       HPI   Samantha Miles is a 35 y.o. female with past medical history of nephrolithiasis and urinary tract infection who presents with concerns for left-sided flank/left lower quadrant pain.  Patient reports that she has had this pain for approximately the last 3 days, pain is described as waxing and waning, dull, located across left lower quadrant, similar to past pain that she has had with nephrolithiasis.  Has not identified anything that makes it better or worse, no fever/chills, no other associated symptoms.  No dysuria.    ROS:   Constitutional: Negative for fever.  Eyes: Negative for visual changes.  ENT: Negative for sore throat.  Cardiovascular: Negative for palpitations. Negative for chest pain.  Respiratory: Negative for shortness of breath.  Gastrointestinal:  Positive for abdominal pain.  Negative for vomiting or diarrhea.  Genitourinary: Negative for dysuria.   Musculoskeletal: Negative for back pain.  Skin: Negative for rash.  Neurological: Negative for headaches, focal weakness, or numbness.     All other systems have been reviewed and are negative except as otherwise documented.    Past Medical History:   Diagnosis Date   ??? Depression    ??? Eczema        Past Surgical History:   Procedure Laterality Date   ??? INNER EAR SURGERY     ??? MIDDLE EAR SURGERY     ??? TUBAL LIGATION           Current Facility-Administered Medications:   ???  ketorolac (TORADOL) injection 30 mg, 30 mg, Intravenous, Once, Jesse Fall, MD    Current Outpatient Medications:   ???  betamethasone dipropionate (DIPROLENE) 0.05 % ointment, betamethasone dipropionate 0.05 % topical ointment, Disp: , Rfl:   ???  betamethasone, augmented, (DIPROLENE) 0.05 % cream, Apply twice daily to areas of eczema until flat and smooth. Avoid face and skin folds, Disp: 50 g, Rfl: 5  ???  clobetasoL (TEMOVATE) 0.05 % ointment, Apply to eczema on hands at night until resolved., Disp: 60 g, Rfl: 5  ???  dupilumab (DUPIXENT SYRINGE) 300 mg/2 mL Syrg injection, Inject the contents of 1 syringe (300 mg total) under the skin every fourteen (14) days., Disp: 4 mL, Rfl: 5  ???  empty container Misc, Use as directed to dispose of Dupixent syringes., Disp: 1 each, Rfl: 2  ???  fexofenadine (ALLEGRA) 180 MG tablet, Take 180 mg by mouth daily., Disp: , Rfl: 11  ???  guaiFENesin 200 mg tablet, Take 2 tablets (400 mg total) by mouth every six (6) hours as needed for congestion., Disp: 30 tablet, Rfl: 0  ???  ibuprofen (ADVIL,MOTRIN) 800 MG tablet, TAKE 1 TABLET BY MOUTH EVERY 8 HOURS AS NEEDED FOR MODERATE PAIN, Disp: , Rfl: 0  ???  meloxicam (MOBIC) 15 MG tablet, Mobic 15 mg tablet  Take 1 tablet(s) every day by oral route., Disp: , Rfl:   ???  nystatin (MYCOSTATIN) 100,000 unit/gram ointment, Apply 1 application topically Two (2) times a day. To scaly areas on feet and between toes, Disp: 30 g, Rfl: 5  ???  nystatin (MYCOSTATIN) powder, , Disp: , Rfl:   ???  ondansetron (ZOFRAN) 4 MG tablet, Take 4 mg by mouth., Disp: , Rfl:   ???  oxyCODONE (ROXICODONE) 5 MG immediate release tablet, Take 1 tablet (5 mg total) by mouth every four (4) hours as needed for pain for up to 12 doses., Disp: 12 tablet, Rfl: 0  ???  PARoxetine (PAXIL) 40  MG tablet, , Disp: , Rfl:   ???  TRUEPLUS PEN NEEDLE 31 gauge x 1/4 (6 mm) Ndle, , Disp: , Rfl:   ???  VICTOZA 2-PAK 0.6 mg/0.1 mL (18 mg/3 mL) injection, , Disp: , Rfl:     Allergies  Sulfacetamide sodium and Sulfamethoxazole    Family History   Problem Relation Age of Onset   ??? Multiple sclerosis Mother    ??? Melanoma Neg Hx    ??? Basal cell carcinoma Neg Hx    ??? Squamous cell carcinoma Neg Hx        Social History  Social History     Tobacco Use   ??? Smoking status: Never Smoker   ??? Smokeless tobacco: Never Used   Substance Use Topics   ??? Alcohol use: No     Alcohol/week: 0.0 standard drinks   ??? Drug use: No        Physical Exam     VITAL SIGNS:      Vitals:    07/30/21 2302 07/30/21 2303   BP:  117/78   Pulse:  78   Resp:  18   Temp:  37.3 ??C (99.1 ??F)   SpO2:  100%   Weight: (!) 133.8 kg (295 lb)    Height: 160 cm (5' 3)        Constitutional: Alert and oriented. Well appearing and in no distress.  Eyes: Conjunctivae are normal.  HEENT: Normocephalic and atraumatic. Conjunctivae clear. No congestion. Moist mucous membranes.   Cardiovascular: Normal rate, regular rhythm. Normal and symmetric distal pulses. Brisk capillary refill. Normal skin turgor.  Respiratory: Normal respiratory effort. Breath sounds are normal. There are no wheezing or crackles heard.  Gastrointestinal: Soft, mild tenderness to palpation along left lower quadrant and left flank.  Obese.  Genitourinary: Deferred.  Musculoskeletal: Non-tender with normal range of motion in all extremities.       Right lower leg: No tenderness or edema.       Left lower leg: No tenderness or edema.  Neurologic: Normal speech and language. No gross focal neurologic deficits are appreciated. Patient is moving all extremities equally, face is symmetric at rest and with speech.  Skin: Skin is warm, dry and intact. No rash noted.  Psychiatric: Mood and affect are normal. Speech and behavior are normal.     Radiology     CT Abdomen Pelvis W IV Contrast Only    (Results Pending)        Laboratory Data     Lab Results   Component Value Date    WBC 11.0 07/30/2021    HGB 14.7 07/30/2021    HCT 43.1 07/30/2021    PLT 365 07/30/2021       Lab Results   Component Value Date    NA 140 07/30/2021    K 3.2 (L) 07/30/2021    CL 104 07/30/2021    CO2 28.6 07/30/2021    BUN 7 (L) 07/30/2021    CREATININE 0.93 (H) 07/30/2021    GLU 101 07/30/2021    CALCIUM 9.9 07/30/2021       Lab Results   Component Value Date    BILITOT 0.4 07/30/2021    PROT 7.4 07/30/2021    ALBUMIN 4.1 07/30/2021    ALT 50 (H) 07/30/2021    AST 52 (H) 07/30/2021    ALKPHOS 72 07/30/2021       No results found for: LABPROT, INR, APTT    Pertinent labs & imaging results that were  available during my care of the patient were reviewed by me and considered in my medical decision making (see chart for details).    Portions of this record have been created using Scientist, clinical (histocompatibility and immunogenetics). Dictation errors have been sought, but may not have been identified and corrected.     Jesse Fall, MD  Resident  07/31/21 [phone]

## 2021-07-31 NOTE — Unmapped (Addendum)
Pt ambulatory to triage for flank pain.

## 2021-08-03 NOTE — Unmapped (Signed)
Recent:   What is the date of your last related visit?  07/31/21 ED [facility]  Related acute medications Rx'd:   none  Home treatment tried:  none      Relevant:   Allergies: Sulfacetamide sodium and Sulfamethoxazole  Medications: no changes reported  Health History: no changes reported  Weight: n/a    Patient calling about lab results.  Urine culture indicates probable contaminate.      Patient reports no symptoms today.  Flank pain resolved.  Denies hematuria, dysuria, fever, flank or abdominal pain.      Advised to call back if any symptoms return.            Reason for Disposition  ??? Health Information question, no triage required and triager able to answer question    Answer Assessment - Initial Assessment Questions  1. REASON FOR CALL or QUESTION: What is your reason for calling today? or How can I best help you? or What question do you have that I can help answer?      Calling about urine culture results.    Protocols used: INFORMATION ONLY CALL - NO TRIAGE-A-AH

## 2021-08-03 NOTE — Unmapped (Signed)
Urine Culture  Order: [id number] - Reflex for Order [id number]  Status: Final result ??    50,000 to 100,000 CFU/mL Escherichia coli??Abnormal??   Presumptive Identification    Specimen Source: Clean Catch    Probable contaminate

## 2021-08-05 DIAGNOSIS — L209 Atopic dermatitis, unspecified: Principal | ICD-10-CM

## 2021-08-05 DIAGNOSIS — L309 Dermatitis, unspecified: Principal | ICD-10-CM

## 2021-08-05 MED ORDER — DUPIXENT 300 MG/2 ML SUBCUTANEOUS SYRINGE
SUBCUTANEOUS | 5 refills | 28 days
Start: 2021-08-05 — End: ?

## 2021-08-05 NOTE — Unmapped (Signed)
Patient is requesting the following refill  Requested Prescriptions     Pending Prescriptions Disp Refills   ??? dupilumab (DUPIXENT SYRINGE) 300 mg/2 mL Syrg injection 4 mL 5     Sig: Inject the contents of 1 syringe (300 mg total) under the skin every fourteen (14) days.       Recent Visits  Date Type Provider Dept   01/23/21 Office Visit Tomasita Crumble, MD [facility]   Showing recent visits within past 365 days with a meds authorizing provider and meeting all other requirements  Future Appointments  Date Type Provider Dept   09/03/21 Appointment Dossie Arbour, MD [facility]   Showing future appointments within next 365 days with a meds authorizing provider and meeting all other requirements       Labs: Not applicable this refill

## 2021-08-05 NOTE — Unmapped (Signed)
[facility] Refill Coordination Note    Specialty Medication(s) to be Shipped:   Inflammatory Disorders: Dupixent    Other medication(s) to be shipped: No additional medications requested for fill at this time     Samantha Miles, DOB: 08-16-1986  Phone: [phone] (home)       All above HIPAA information was verified with patient.     Was a Nurse, learning disability used for this call? No    Completed refill call assessment today to schedule patient's medication shipment from the [facility] [phone]).  All relevant notes have been reviewed.     Specialty medication(s) and dose(s) confirmed: Regimen is correct and unchanged.   Changes to medications: Octivia reports no changes at this time.  Changes to insurance: No  New side effects reported not previously addressed with a pharmacist or physician: None reported  Questions for the pharmacist: No    Confirmed patient received a Conservation officer, historic buildings and a Surveyor, mining with first shipment. The patient will receive a drug information handout for each medication shipped and additional FDA Medication Guides as required.       DISEASE/MEDICATION-SPECIFIC INFORMATION        For patients on injectable medications: Patient currently has 1 doses left.  Next injection is scheduled for 08/10/21.    SPECIALTY MEDICATION ADHERENCE     Medication Adherence    Patient reported X missed doses in the last month: 0  Specialty Medication: DUPIXENT SYRINGE 300 mg/2 mL  Patient is on additional specialty medications: No              Were doses missed due to medication being on hold? No    Dupixent 300/2 mg/ml: 5 days of medicine on hand        REFERRAL TO PHARMACIST     Referral to the pharmacist: Not needed      [facility]     Shipping address confirmed in Epic.     Delivery Scheduled: Yes, Expected medication delivery date: 08/15/21.  However, Rx request for refills was sent to the provider as there are none remaining.     Medication will be delivered via Same Day Courier to the prescription address in Epic WAM.    Unk Lightning   [facility] Specialty Technician

## 2021-08-07 MED ORDER — DUPIXENT 300 MG/2 ML SUBCUTANEOUS SYRINGE
SUBCUTANEOUS | 5 refills | 28.00000 days | Status: CP
Start: 2021-08-07 — End: ?
  Filled 2021-08-15: qty 4, 28d supply, fill #0

## 2021-08-26 ENCOUNTER — Ambulatory Visit: Admit: 2021-08-26 | Payer: MEDICARE | Attending: Surgery | Primary: Surgery

## 2021-09-03 ENCOUNTER — Ambulatory Visit
Admit: 2021-09-03 | Discharge: 2021-09-04 | Payer: MEDICARE | Attending: Student in an Organized Health Care Education/Training Program | Primary: Student in an Organized Health Care Education/Training Program

## 2021-09-03 DIAGNOSIS — L209 Atopic dermatitis, unspecified: Principal | ICD-10-CM

## 2021-09-03 NOTE — Unmapped (Signed)
Dermatology Note     Assessment and Plan:    Atopic dermatitis, chronic, well controlled on Dupixent (since 05/2019)  - Overall significantly improved on dupilumab therapy   - continue dupilumab (DUPIXENT SYRINGE) 300 mg/2 mL Syrg injection; Inject the contents of 1 syringe (300 mg total) under the skin every fourteen (14) days. Refilled9/2022  - continue clobetasol bid prn flares  -We discussed the importance of gentle skin care and continued use of emollient moisturizer      The patient was advised to call for an appointment should any new, changing, or symptomatic lesions develop.     RTC: Return in about 1 year (around 09/03/2022) for atopic dermatitis. or sooner as needed   _________________________________________________________________      Chief Complaint     Chief Complaint   Patient presents with   ??? Dermatitis     6 month fu, pt reports dupixent has been working well still, has not spread to any other areas besides arms, no other concerns        HPI     Samantha Miles is a 35 y.o. female who presents as a returning patient (last seen 01/23/2021) to [facility] for follow up of atopic dermatitis. At [facility] pt was doing well and she continued Dupixent.     Today  - skin is clear, not needing topicals  - no issues with dupixent injections, denies dry or gritty eyes    The patient denies any other new or changing lesions or areas of concern.     Pertinent Past Medical History   Atopic dermatitis- started Dupixen 05/2019  No history of skin cancer    Problem List    None           Family History:   Negative for melanoma    Past Medical History, Family History, Social History, Medication List, Allergies, and Problem List were reviewed in the rooming section of Epic.     ROS: Other than symptoms mentioned in the HPI, no fevers, chills, or other skin complaints    Physical Examination     GENERAL: Well-appearing female in no acute distress, resting comfortably.  NEURO: Alert and oriented, answers questions appropriately  PSYCH: Normal mood and affect  SKIN: Examination of the bilateral upper extremities and hands was performed  - eczematous patch of R 4th webspace  - skin otherwise wnl    All areas not commented on are within normal limits or unremarkable      (Approved Template 08/12/2020)

## 2021-09-03 NOTE — Unmapped (Addendum)
-   Moisturize several times a day with a heavy cream: Vanicream, CereVe, Cetaphil, Eucerin

## 2021-09-05 NOTE — Unmapped (Signed)
[facility] Refill Coordination Note    Specialty Medication(s) to be Shipped:   Inflammatory Disorders: Dupixent    Other medication(s) to be shipped: No additional medications requested for fill at this time     Samantha Miles, DOB: [birth year]  Phone: [phone] (home)       All above HIPAA information was verified with patient.     Was a Nurse, learning disability used for this call? No    Completed refill call assessment today to schedule patient's medication shipment from the [facility] [phone]).  All relevant notes have been reviewed.     Specialty medication(s) and dose(s) confirmed: Regimen is correct and unchanged.   Changes to medications: Samantha Miles reports no changes at this time.  Changes to insurance: No  New side effects reported not previously addressed with a pharmacist or physician: None reported  Questions for the pharmacist: No    Confirmed patient received a Conservation officer, historic buildings and a Surveyor, mining with first shipment. The patient will receive a drug information handout for each medication shipped and additional FDA Medication Guides as required.       DISEASE/MEDICATION-SPECIFIC INFORMATION        For patients on injectable medications: Patient currently has 1 doses left.  Next injection is scheduled for 09/07/21.    SPECIALTY MEDICATION ADHERENCE     Medication Adherence    Patient reported X missed doses in the last month: 0  Specialty Medication: Dupixent  Patient is on additional specialty medications: No  Informant: patient              Were doses missed due to medication being on hold? No    Dupixent 300 mf/30ml: 14 days of medicine on hand       REFERRAL TO PHARMACIST     Referral to the pharmacist: Not needed      [facility]     Shipping address confirmed in Epic.     Delivery Scheduled: Yes, Expected medication delivery date: 09/12/21.     Medication will be delivered via Same Day Courier to the prescription address in Epic WAM.    Arnold Long   [facility] Specialty Pharmacist

## 2021-09-09 ENCOUNTER — Emergency Department
Admit: 2021-09-09 | Discharge: 2021-09-10 | Disposition: A | Discharge status: Home / Self Care | Payer: MEDICARE | Attending: Student in an Organized Health Care Education/Training Program

## 2021-09-09 ENCOUNTER — Ambulatory Visit
Admit: 2021-09-09 | Discharge: 2021-09-10 | Disposition: A | Discharge status: Home / Self Care | Payer: MEDICARE | Attending: Student in an Organized Health Care Education/Training Program

## 2021-09-09 DIAGNOSIS — R1013 Epigastric pain: Principal | ICD-10-CM

## 2021-09-09 LAB — COMPREHENSIVE METABOLIC PANEL
ALBUMIN: 4.4 g/dL (ref 3.4–5.0)
ALKALINE PHOSPHATASE: 86 U/L (ref 46–116)
ALT (SGPT): 50 U/L — ABNORMAL HIGH (ref 10–49)
ANION GAP: 11 mmol/L (ref 5–14)
AST (SGOT): 44 U/L — ABNORMAL HIGH (ref <=34)
BILIRUBIN TOTAL: 0.7 mg/dL (ref 0.3–1.2)
BLOOD UREA NITROGEN: 10 mg/dL (ref 9–23)
BUN / CREAT RATIO: 12
CALCIUM: 9.6 mg/dL (ref 8.7–10.4)
CHLORIDE: 101 mmol/L (ref 98–107)
CO2: 25 mmol/L (ref 20.0–31.0)
CREATININE: 0.84 mg/dL — ABNORMAL HIGH
EGFR CKD-EPI (2021) FEMALE: >90 mL/min/{1.73_m2} (ref >=60)
GLUCOSE RANDOM: 102 mg/dL (ref 70–179)
POTASSIUM: 3.2 mmol/L — ABNORMAL LOW (ref 3.4–4.8)
PROTEIN TOTAL: 7.7 g/dL (ref 5.7–8.2)
SODIUM: 137 mmol/L (ref 135–145)

## 2021-09-09 LAB — URINALYSIS WITH CULTURE REFLEX
BILIRUBIN UA: NEGATIVE
BLOOD UA: NEGATIVE
GLUCOSE UA: NEGATIVE
KETONES UA: NEGATIVE
NITRITE UA: NEGATIVE
PH UA: 6 (ref 5.0–9.0)
PROTEIN UA: NEGATIVE
RBC UA: 2 /HPF (ref <4)
SPECIFIC GRAVITY UA: 1.015 (ref 1.005–1.040)
SQUAMOUS EPITHELIAL: 15 /HPF — ABNORMAL HIGH (ref 0–5)
UROBILINOGEN UA: 0.2
WBC UA: 12 /HPF — ABNORMAL HIGH (ref 0–5)

## 2021-09-09 LAB — CBC W/ AUTO DIFF
BASOPHILS ABSOLUTE COUNT: 0.1 10*9/L (ref 0.0–0.1)
BASOPHILS RELATIVE PERCENT: 0.3 %
EOSINOPHILS ABSOLUTE COUNT: 0.1 10*9/L (ref 0.0–0.5)
EOSINOPHILS RELATIVE PERCENT: 0.7 %
HEMATOCRIT: 46 % — ABNORMAL HIGH (ref 34.0–44.0)
HEMOGLOBIN: 16 g/dL — ABNORMAL HIGH (ref 11.3–14.9)
LYMPHOCYTES ABSOLUTE COUNT: 2.4 10*9/L (ref 1.1–3.6)
LYMPHOCYTES RELATIVE PERCENT: 14.8 %
MEAN CORPUSCULAR HEMOGLOBIN CONC: 34.9 g/dL (ref 32.0–36.0)
MEAN CORPUSCULAR HEMOGLOBIN: 29.8 pg (ref 25.9–32.4)
MEAN CORPUSCULAR VOLUME: 85.4 fL (ref 77.6–95.7)
MEAN PLATELET VOLUME: 7.9 fL (ref 6.8–10.7)
MONOCYTES ABSOLUTE COUNT: 0.5 10*9/L (ref 0.3–0.8)
MONOCYTES RELATIVE PERCENT: 3.1 %
NEUTROPHILS ABSOLUTE COUNT: 13.5 10*9/L — ABNORMAL HIGH (ref 1.8–7.8)
NEUTROPHILS RELATIVE PERCENT: 81.1 %
NUCLEATED RED BLOOD CELLS: 0 /100{WBCs} (ref <=4)
PLATELET COUNT: 389 10*9/L (ref 150–450)
RED BLOOD CELL COUNT: 5.39 10*12/L — ABNORMAL HIGH (ref 3.95–5.13)
RED CELL DISTRIBUTION WIDTH: 13.3 % (ref 12.2–15.2)
WBC ADJUSTED: 16.6 10*9/L — ABNORMAL HIGH (ref 3.6–11.2)

## 2021-09-09 LAB — PREGNANCY, URINE: PREGNANCY TEST URINE: NEGATIVE

## 2021-09-09 LAB — LIPASE: LIPASE: 31 U/L (ref 12–53)

## 2021-09-09 MED ADMIN — ketorolac (TORADOL) injection 15 mg: 15 mg | INTRAVENOUS | Stop: 2021-09-09

## 2021-09-09 NOTE — Unmapped (Signed)
Pt reports having abd pain that began this morning. Also reports feeling nauseous.

## 2021-09-10 MED ADMIN — ondansetron (ZOFRAN) injection 4 mg: 4 mg | INTRAVENOUS | @ 01:00:00 | Stop: 2021-09-09

## 2021-09-10 MED ADMIN — potassium chloride (KLOR-CON) packet 40 mEq: 40 meq | ORAL | Stop: 2021-09-09

## 2021-09-10 MED ADMIN — sucralfate (CARAFATE) oral suspension: 1 g | ORAL | @ 01:00:00 | Stop: 2021-09-09

## 2021-09-10 MED ADMIN — lidocaine (XYLOCAINE) 2% viscous mucosal solution: 10 mL | ORAL | @ 01:00:00 | Stop: 2021-09-09

## 2021-09-10 MED ADMIN — aluminum-magnesium hydroxide-simethicone (MAALOX MAX) 80-80-8 mg/mL oral suspension: 30 mL | ORAL | @ 01:00:00 | Stop: 2021-09-09

## 2021-09-10 MED ADMIN — iohexoL (OMNIPAQUE) 350 mg iodine/mL solution 100 mL: 100 mL | INTRAVENOUS | @ 01:00:00 | Stop: 2021-09-09

## 2021-09-10 MED ADMIN — MORPhine 4 mg/mL injection 4 mg: 4 mg | INTRAVENOUS | @ 01:00:00 | Stop: 2021-09-09

## 2021-09-10 NOTE — Unmapped (Signed)
[facility] Emergency Department Provider Note      Final ED Clinical Impression     Final diagnoses:   Epigastric pain (Primary)       Initial Impression, ED Course, Assessment and Plan     Initial Clinical Impression:    September 09, 2021 6:53 PM   Impression: Samantha Miles is a 35 y.o. female with a past medical history as described below presenting to the emergency department for evaluation of abdominal pain as noted in the HPI.     BP 136/77  - Pulse 96  - Temp 36.9 ??C (98.5 ??F) (Oral)  - Resp 18  - LMP 09/01/2021 (Approximate)  - SpO2 100%     Vitals as above.  Hemodynamically stable without hypotension, tachycardia, fever, or hypoxia.  On exam, patient is awake and alert, no acute distress.  Normal cardiopulmonary exam.  Abdomen is protuberant but soft with tenderness to palpation epigastrium, left upper quadrant, right upper quadrant and without rebound or guarding.  No peritoneal signs.    Differential diagnosis includes biliary colic, acute cholecystitis, pancreatitis, nephrolithiasis, colitis, gastritis, among other potential etiologies.    Plan for CBC, CMP, lipase, UA, right upper quadrant POCUS. Will treat patient with Toradol.    ED Course:    6:55 PM: CBC shows leukocytosis to 16.6.  CMP notable for mild hypokalemia to 3.2, will replete.  Lipase within normal limits.  UA with trace LEs, 12 WBCs but 15 squames.  Inconsistent with UTI and patient not having any urinary symptoms.  Right upper quadrant POCUS without evidence of gallstones or cholecystitis.    8:37 PM: On reassessment, patient is still experiencing significant pain. Given her symptoms and high white count, will proceed with CT A/P.    9:36 PM: CT A/P shows no acute intra-abdominal/pelvic pathology.  There is left-sided nephrolithiasis, diffuse hepatic steatosis.  We will have her follow-up with her PCP to discuss these incidental findings.  Given reassuring work-up, patient can be discharged.  Patient feels comfortable with plan and is amenable to discharge.  Strict return precautions reviewed.    Disposition: Discharge    _____________________________________________________________________    The case was discussed with the ED attending physician Myrtie Hawk, MD who is in agreement with the above assessment and plan.    Portions of this record have been created using Scientist, clinical (histocompatibility and immunogenetics). Dictation errors have been sought, but may not have been identified and corrected.    Additional Medical Decision Making     This patient was evaluated in Emergency Department at the time of this visit for the symptoms described in the history of present illness. They were evaluated in the context of the global COVID-19 pandemic, which necessitated consideration that the patient might be at risk for infection with the SARS-CoV-2 virus that causes COVID-19. Institutional protocols and algorithms that pertain to the evaluation of patients at risk for COVID-19 were followed during the patient's care in the ED.    I have reviewed the vital signs and the nursing notes. Labs, EKG tracing, and radiology results that were available during my care of the patient were independently reviewed by me and considered in my medical decision making.     I independently visualized the EKG tracing if performed  I independently visualized the radiology images if performed  I reviewed the patient's prior medical records if available.  Additional history obtained from family if available    History     CHIEF COMPLAINT:   Chief Complaint  Patient presents with   ??? Abdominal Pain       HPI: Samantha Miles is a 35 y.o. female with a past medical history of TIAs, nephrolithiasis, and pyelnonephritis presenting to the ED for abdominal pain. The patient reports lower abdominal pain with associated back pain since this morning. She has never experienced these symptoms previously. The patient has no abdominal surgical history. She does not endorse EtOH or illicit drug usage. She denies fever, chills, nausea, vomiting, melena, hematochezia, or diarrhea.    PAST MEDICAL HISTORY/PAST SURGICAL HISTORY:   Past Medical History:   Diagnosis Date   ??? Depression    ??? Eczema        Past Surgical History:   Procedure Laterality Date   ??? INNER EAR SURGERY     ??? MIDDLE EAR SURGERY     ??? TUBAL LIGATION         MEDICATIONS:   No current facility-administered medications for this encounter.    Current Outpatient Medications:   ???  betamethasone dipropionate (DIPROLENE) 0.05 % ointment, betamethasone dipropionate 0.05 % topical ointment, Disp: , Rfl:   ???  betamethasone, augmented, (DIPROLENE) 0.05 % cream, Apply twice daily to areas of eczema until flat and smooth. Avoid face and skin folds, Disp: 50 g, Rfl: 5  ???  clobetasoL (TEMOVATE) 0.05 % ointment, Apply to eczema on hands at night until resolved., Disp: 60 g, Rfl: 5  ???  dupilumab (DUPIXENT SYRINGE) 300 mg/2 mL Syrg injection, Inject the contents of 1 syringe (300 mg total) under the skin every fourteen (14) days., Disp: 4 mL, Rfl: 5  ???  empty container Misc, Use as directed to dispose of Dupixent syringes., Disp: 1 each, Rfl: 2  ???  fexofenadine (ALLEGRA) 180 MG tablet, Take 180 mg by mouth daily., Disp: , Rfl: 11  ???  guaiFENesin 200 mg tablet, Take 2 tablets (400 mg total) by mouth every six (6) hours as needed for congestion., Disp: 30 tablet, Rfl: 0  ???  hydroCHLOROthiazide (HYDRODIURIL) 12.5 MG tablet, Take 12.5 mg by mouth daily., Disp: , Rfl:   ???  ibuprofen (ADVIL,MOTRIN) 800 MG tablet, TAKE 1 TABLET BY MOUTH EVERY 8 HOURS AS NEEDED FOR MODERATE PAIN, Disp: , Rfl: 0  ???  meloxicam (MOBIC) 15 MG tablet, Mobic 15 mg tablet  Take 1 tablet(s) every day by oral route., Disp: , Rfl:   ???  nystatin (MYCOSTATIN) 100,000 unit/gram ointment, Apply 1 application topically Two (2) times a day. To scaly areas on feet and between toes, Disp: 30 g, Rfl: 5  ???  nystatin (MYCOSTATIN) powder, , Disp: , Rfl:   ???  ondansetron (ZOFRAN) 4 MG tablet, Take 4 mg by mouth., Disp: , Rfl:   ???  oxyCODONE (ROXICODONE) 5 MG immediate release tablet, Take 1 tablet (5 mg total) by mouth every four (4) hours as needed for pain for up to 12 doses., Disp: 12 tablet, Rfl: 0  ???  PARoxetine (PAXIL) 40 MG tablet, , Disp: , Rfl:   ???  TRUEPLUS PEN NEEDLE 31 gauge x 1/4 (6 mm) Ndle, , Disp: , Rfl:   ???  VICTOZA 2-PAK 0.6 mg/0.1 mL (18 mg/3 mL) injection, , Disp: , Rfl:     ALLERGIES:   Sulfacetamide sodium and Sulfamethoxazole    SOCIAL HISTORY:   Social History     Tobacco Use   ??? Smoking status: Never Smoker   ??? Smokeless tobacco: Never Used   Substance Use Topics   ??? Alcohol use:  No       FAMILY HISTORY:  Family History   Problem Relation Age of Onset   ??? Multiple sclerosis Mother    ??? Melanoma Neg Hx    ??? Basal cell carcinoma Neg Hx    ??? Squamous cell carcinoma Neg Hx           Review of Systems    A 10 point review of systems was performed and is negative other than positive elements noted in HPI   Constitutional: No fever   Eyes: No conjunctivitis or visual changes  ENT: No epistaxis or sore throat.  Cardiovascular:  No palpitations or chest pain  Respiratory: No shortness of breath, hemoptysis or pleurisy  GI: Positive for abdominal pain. Negative for vomiting or diarrhea  GU: No dysuria or frequency  Skin: No rash  Allergy: No hives or symptoms of angioedema  Musculoskeletal: Positive for back pain. No unilateral leg swelling or calf pain  Neurological: No headaches, focal weakness or numbness    Physical Exam     VITAL SIGNS:    BP 136/77  - Pulse 96  - Temp 36.9 ??C (98.5 ??F) (Oral)  - Resp 18  - LMP 09/01/2021 (Approximate)  - SpO2 100%     Constitutional: Alert and oriented. Well appearing and in no distress.  Eyes: Conjunctivae are normal. Sclera non-icteric  ENT       Head: Normocephalic and atraumatic.       Nose: No congestion.       Mouth/Throat: Mucous membranes are moist.       Neck: No stridor.  Cardiovascular: Normal rate, regular rhythm. No appreciable murmurs or rubs. Distal pulses present and symmetrical bilaterally.  Respiratory: Normal respiratory effort. Breath sounds are normal. No stridor, wheezing or focal crackles.  Gastrointestinal: Abdomen is protuberant but soft with tenderness to palpation epigastrium, left upper quadrant, right upper quadrant and without rebound or guarding.  No peritoneal signs.  Genitourinary: No suprapubic tenderness  Musculoskeletal: Normal range of motion in all extremities.   Neurologic: Normal speech and language. No gross focal neurologic deficits are appreciated.  Skin: Skin is warm, dry. No rash noted.  Psychiatric: Mood and affect are normal. Speech and behavior are normal.       EKG     None    Radiology     CT Abdomen Pelvis with IV Contrast ONLY    Result Date: 09/09/2021  EXAM: CT ABDOMEN PELVIS W CONTRAST DATE: 09/09/2021 8:53 PM ACCESSION: [id number] UN DICTATED: 09/09/2021 8:55 PM INTERPRETATION LOCATION: [facility] CLINICAL INDICATION: 35 years old with epigastric pain ; Epigastric pain  COMPARISON: CT abdomen pelvis dated 07/31/2021. TECHNIQUE: A helical CT scan of the abdomen and pelvis was obtained following IV contrast from the lung bases through the pubic symphysis. Images were reconstructed in the axial plane. Coronal and sagittal reformatted images were also provided for further evaluation. FINDINGS: LOWER CHEST: Unremarkable. LIVER: Hepatic steatosis with focal fatty sparing along the gallbladder fossa; no suspicious liver lesion identified. BILIARY: No biliary ductal dilatation.  The gallbladder is normal in appearance. SPLEEN: Normal in size and contour. PANCREAS: Normal pancreatic contour.  No focal lesions.  No ductal dilation. ADRENAL GLANDS: Normal appearance of the adrenal glands. KIDNEYS/URETERS: Multifocal scarring most pronounced in the left kidney.  No hydronephrosis.  Left kidney lower pole 0.5 cm nonobstructing renal calculus (5:80). Indeterminate low-attenuation lesion in the right kidney (series 2#66), too small to characterize. BLADDER: Unremarkable. REPRODUCTIVE ORGANS: The uterus is present. GI TRACT:  The stomach appears unremarkable. The loops of small bowel are normal in caliber. No evidence of acute colonic pathology. The appendix appears unremarkable. PERITONEUM, RETROPERITONEUM AND MESENTERY: No free air.  No ascites.  No fluid collection. LYMPH NODES: No adenopathy. VESSELS: Hepatic and portal veins are patent.  Normal caliber aorta. BONES and SOFT TISSUES: No aggressive osseous lesions.  No focal soft tissue lesions.     1.No evidence of acute intra-abdominal/pelvic pathology. 2.Left-sided nephrolithiasis. 3.Diffuse hepatic steatosis. 4.Additional chronic and incidental findings as noted above.    ED POCUS Right Upper Quadrant    Result Date: 09/10/2021  Limited Biliary Ultrasound (CPT: [phone]) Indication: A focused ultrasound exam of the gallbladder and right upper quadrant was performed to evaluate for gallstones, cholecystitis, and cholestasis in this patient. The ultrasound was performed with the following indications, as noted in the H&P: abdominal pain Identified structures: The gallbladder, gallbladder wall, common bile duct, liver, and porta hepatis were examined. Findings: Exam of the above structures revealed the following findings: Gallstones: Absent Sludge: Absent Sonographic Murphy: Absent Pericholecystic fluid: Absent Intrahepatic cholestasis: Absent GB wall thickness: Normal        Maximal GB wall thickness in transverse plane: 3.3 millimeters Common bile duct diameter: Normal        CBD diameter: 2.9 millimeters Other findings: None Limitations: Patient's body habitus. Impression: Normal gallbladder ultrasound Other: None Interpreted by: Consepcion Hearing, MD     Labs Reviewed   COMPREHENSIVE METABOLIC PANEL - Abnormal; Notable for the following components:       Result Value    Potassium 3.2 (*)     Creatinine 0.84 (*)     AST 44 (*)     ALT 50 (*)     All other components within normal limits   URINALYSIS WITH CULTURE REFLEX - Abnormal; Notable for the following components:    Leukocyte Esterase, UA Trace (*)     WBC, UA 12 (*)     Squam Epithel, UA 15 (*)     Bacteria, UA Occasional (*)     All other components within normal limits   CBC W/ AUTO DIFF - Abnormal; Notable for the following components:    WBC 16.6 (*)     RBC 5.39 (*)     HGB 16.0 (*)     HCT 46.0 (*)     Absolute Neutrophils 13.5 (*)     All other components within normal limits   LIPASE - Normal   PREGNANCY, URINE - Normal   URINE CULTURE   CBC W/ DIFFERENTIAL    Narrative:     The following orders were created for panel order CBC w/ Differential.  Procedure                               Abnormality         Status                     ---------                               -----------         ------                     CBC w/ Differential[[id number]]         Abnormal  Final result                 Please view results for these tests on the individual orders.       I independently visualized these images.    Procedures     Procedure(s) performed: None.    Documentation assistance was provided by Meryle Ready, Scribe, on September 09, 2021 at 6:53 PM for Loney Hering, MPH, MD.    Documentation assistance was provided by the scribe in my presence.  The documentation recorded by the scribe has been reviewed by me and accurately reflects the services I personally performed. Loney Hering, MD         Consepcion Hearing, MD  Resident  09/10/21 [phone]

## 2021-09-11 ENCOUNTER — Encounter: Payer: Self-pay | Admitting: Emergency Medicine

## 2021-09-11 ENCOUNTER — Emergency Department: Payer: Medicare HMO

## 2021-09-11 ENCOUNTER — Other Ambulatory Visit: Payer: Self-pay

## 2021-09-11 ENCOUNTER — Emergency Department
Admission: EM | Admit: 2021-09-11 | Discharge: 2021-09-11 | Disposition: A | Discharge status: Home / Self Care | Payer: Medicare HMO | Attending: Emergency Medicine | Admitting: Emergency Medicine

## 2021-09-11 DIAGNOSIS — K760 Fatty (change of) liver, not elsewhere classified: Secondary | ICD-10-CM | POA: Diagnosis not present

## 2021-09-11 DIAGNOSIS — E876 Hypokalemia: Secondary | ICD-10-CM | POA: Insufficient documentation

## 2021-09-11 DIAGNOSIS — R109 Unspecified abdominal pain: Secondary | ICD-10-CM | POA: Diagnosis present

## 2021-09-11 LAB — HEPATIC FUNCTION PANEL
ALT: 48 U/L — ABNORMAL HIGH (ref 0–44)
AST: 47 U/L — ABNORMAL HIGH (ref 15–41)
Albumin: 4 g/dL (ref 3.5–5.0)
Alkaline Phosphatase: 62 U/L (ref 38–126)
Bilirubin, Direct: 0.1 mg/dL (ref 0.0–0.2)
Indirect Bilirubin: 0.6 mg/dL (ref 0.3–0.9)
Total Bilirubin: 0.7 mg/dL (ref 0.3–1.2)
Total Protein: 7.2 g/dL (ref 6.5–8.1)

## 2021-09-11 LAB — CBC
HCT: 44.7 % (ref 36.0–46.0)
Hemoglobin: 15.6 g/dL — ABNORMAL HIGH (ref 12.0–15.0)
MCH: 30.6 pg (ref 26.0–34.0)
MCHC: 34.9 g/dL (ref 30.0–36.0)
MCV: 87.8 fL (ref 80.0–100.0)
Platelets: 391 10*3/uL (ref 150–400)
RBC: 5.09 MIL/uL (ref 3.87–5.11)
RDW: 12.2 % (ref 11.5–15.5)
WBC: 10.8 10*3/uL — ABNORMAL HIGH (ref 4.0–10.5)
nRBC: 0 % (ref 0.0–0.2)

## 2021-09-11 LAB — URINALYSIS, COMPLETE (UACMP) WITH MICROSCOPIC
Bilirubin Urine: NEGATIVE
Glucose, UA: NEGATIVE mg/dL
Hgb urine dipstick: NEGATIVE
Ketones, ur: NEGATIVE mg/dL
Nitrite: NEGATIVE
Protein, ur: NEGATIVE mg/dL
Specific Gravity, Urine: 1.013 (ref 1.005–1.030)
pH: 6 (ref 5.0–8.0)

## 2021-09-11 LAB — BASIC METABOLIC PANEL
Anion gap: 10 (ref 5–15)
BUN: 9 mg/dL (ref 6–20)
CO2: 32 mmol/L (ref 22–32)
Calcium: 9 mg/dL (ref 8.9–10.3)
Chloride: 96 mmol/L — ABNORMAL LOW (ref 98–111)
Creatinine, Ser: 0.78 mg/dL (ref 0.44–1.00)
GFR, Estimated: >60 mL/min (ref >60)
Glucose, Bld: 111 mg/dL — ABNORMAL HIGH (ref 70–99)
Potassium: 3.2 mmol/L — ABNORMAL LOW (ref 3.5–5.1)
Sodium: 138 mmol/L (ref 135–145)

## 2021-09-11 LAB — LIPASE, BLOOD: Lipase: 33 U/L (ref 11–51)

## 2021-09-11 LAB — POC URINE PREG, ED: Preg Test, Ur: NEGATIVE

## 2021-09-11 MED ORDER — POTASSIUM CHLORIDE CRYS ER 20 MEQ PO TBCR: 20.0000 meq | EXTENDED_RELEASE_TABLET | Freq: Every day | ORAL | 0 refills | Status: DC

## 2021-09-11 MED ORDER — ACETAMINOPHEN 500 MG PO TABS
1000.0000 mg | ORAL_TABLET | Freq: Once | ORAL | Status: AC
  Administered 2021-09-11: 1000 mg via ORAL
  Filled 2021-09-11: qty 2

## 2021-09-11 MED ORDER — LIDOCAINE 5 % EX PTCH: 1.0000 | MEDICATED_PATCH | Freq: Two times a day (BID) | CUTANEOUS | 0 refills | Status: AC

## 2021-09-11 MED ORDER — LIDOCAINE 5 % EX PTCH
1.0000 | MEDICATED_PATCH | CUTANEOUS | Status: DC
  Administered 2021-09-11: 1 via TRANSDERMAL
  Filled 2021-09-11: qty 1

## 2021-09-11 MED ORDER — KETOROLAC TROMETHAMINE 30 MG/ML IJ SOLN
30.0000 mg | Freq: Once | INTRAMUSCULAR | Status: AC
  Administered 2021-09-11: 30 mg via INTRAMUSCULAR
  Filled 2021-09-11: qty 1

## 2021-09-11 MED ORDER — IBUPROFEN 600 MG PO TABS: 600.0000 mg | ORAL_TABLET | Freq: Four times a day (QID) | ORAL | 0 refills | Status: AC | PRN

## 2021-09-11 NOTE — ED Provider Notes (Signed)
[facility] Emergency Department Provider Note  ____________________________________________   None    (approximate)  I have reviewed the triage vital signs    HISTORY  Chief Complaint Flank Pain    HPI Brittney Sanders is a 35 y.o. female who presents with flank pain.  Pt with left sided flank pain that started yesterday, constant, nothing makes it better or worse. Reports history of kidney stones and having stent in that is no longer in use?  No chest pain, no SOB, no hematuria, no dysuria. No fevers. No tylenol or ibuprofen use.  Patient denies any SI.     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 Authorizing Provider  DUPIXENT 300 MG/2ML prefilled syringe  10/03/19   [provider]  Erythromycin 2 % PADS Apply 1 application topically daily. 06/10/21   Candelaria Stagers, DPM  famotidine (PEPCID) 20 MG tablet Take 1 tablet (20 mg total) by mouth 2 (two) times daily. 10/13/19   Sharman Cheek, MD  fexofenadine (ALLEGRA) 180 MG tablet Take 180 mg by mouth daily.    [provider]  meloxicam (MOBIC) 15 MG tablet Take 15 mg by  mouth daily.    [provider]  metoCLOPramide (REGLAN) 10 MG tablet Take 1 tablet (10 mg total) by mouth every 6 (six) hours as needed. 10/13/19   Sharman Cheek, MD  naproxen (NAPROSYN) 500 MG tablet Take 1 tablet (500 mg total) by mouth 2 (two) times daily with a meal. 10/13/19   Sharman Cheek, MD  nortriptyline (PAMELOR) 10 MG capsule Increase nortriptyline to 30 mg nightly for two weeks, then increase to 40 mg nightly, refilled. 10/02/19   [provider]  nystatin (MYCOSTATIN/NYSTOP) powder  05/16/16   [provider]  ondansetron (ZOFRAN ODT) 4 MG disintegrating tablet Take 1 tablet (4 mg total) by mouth every 8 (eight) hours as needed for nausea or vomiting. 10/21/19   Irean Hong, MD  ondansetron (ZOFRAN) 4 MG tablet Take 1 tablet (4 mg total) by mouth every 8 (eight) hours as needed for nausea or vomiting. 09/09/18   Jeanmarie Plant, MD  PARoxetine (PAXIL) 40 MG tablet Take 40 mg by mouth 2 (two) times a day.    [provider]    Allergies Sulfa antibiotics and Sulfamethoxazole  Family History  Problem Relation Age of Onset   Urolithiasis Maternal Grandmother    Kidney disease Neg Hx    Bladder Cancer Neg Hx    Kidney cancer Neg Hx  Social History Social History   Tobacco Use   Smoking status: Never   Smokeless tobacco: Never  Vaping Use   Vaping Use: Never used  Substance Use Topics   Alcohol use: No    Alcohol/week: 0.0 standard drinks   Drug use: No      Review of Systems Constitutional: No fever/chills Eyes: No visual changes. ENT: No sore throat. Cardiovascular: Denies chest pain. Respiratory: no SOB. No cough Gastrointestinal: No abdominal pain.  No nausea, no vomiting.  No diarrhea.  No constipation. Genitourinary: no severe blood in urine Musculoskeletal: + flank pain Skin: Negative for rash. Neurological: Negative for headaches, focal weakness or numbness. All other ROS  negative ____________________________________________   PHYSICAL EXAM:  VITAL SIGNS: ED Triage Vitals  Enc Vitals Group     BP --      Pulse --      Resp --      Temp --      Temp src --      SpO2 --      Weight 09/11/21 0815 298 lb 1 oz (135.2 kg)     Height 09/11/21 0815 5\' 3"  (1.6 m)     Head Circumference --      Peak Flow --      Pain Score 09/11/21 0814 5     Pain Loc --      Pain Edu? --      Excl. in GC? --     Constitutional: Alert and oriented. Discomfort noted  Eyes: Conjunctivae are normal. EOMI. Head: Atraumatic. Nose: No congestion/rhinnorhea. Mouth/Throat: Mucous membranes are moist.   Neck: No stridor. Trachea Midline. FROM Cardiovascular: Normal rate, regular rhythm.  Good peripheral circulation. Respiratory: no audible stridor, work of breathing  Gastrointestinal: Soft and nontender. No distention.  Musculoskeletal: No lower extremity tenderness nor edema.  No joint effusions. Neurologic:  Normal speech and language. No gross focal neurologic deficits are appreciated.  Skin:  Skin is warm, dry and intact. No rash noted. Psychiatric: Mood and affect are normal. Speech and behavior are normal. GU: Deferred  Flank tenderness on left  ____________________________________________   LABS (all labs ordered are listed, but only abnormal results are displayed)  Labs Reviewed  URINALYSIS, COMPLETE (UACMP) WITH MICROSCOPIC  CBC  BASIC METABOLIC PANEL  POC URINE PREG, ED   ____________________________________________  RADIOLOGY  IMPRESSION: Hepatic steatosis.  Small nonobstructive left renal calculus. No hydronephrosis or renal obstruction is noted. Extensive left renal cortical scarring is noted   ____________________________________________   PROCEDURES  Procedure(s) performed (including Critical Care):  Procedures   ____________________________________________   INITIAL IMPRESSION / ASSESSMENT AND PLAN / ED COURSE  [name]  was evaluated in Emergency Department on 09/11/2021 for the symptoms described in the history of present illness. She was evaluated in the context of the global COVID-19 pandemic, which necessitated consideration that the patient might be at risk for infection with the SARS-CoV-2 virus that causes COVID-19. Institutional protocols and algorithms that pertain to the evaluation of patients at risk for COVID-19 are in a state of rapid change based on information released by regulatory bodies including the CDC and federal and state organizations. These policies and algorithms were followed during the patient's care in the ED.     Pt presents with flank pain.  Suspect this is most likely kidney stone. Will get labs to evaluate for electrolyte abnormalities/AKI.  CT scan ordered to evaluate for kidney stone.  Also consider appendicitis vs SBO although less likely given  description of pain.  Will get UA to evaluate for UTI/pyelo.   CT scan is negative.  Labs are reassuring.  Urine is difficult to interpret due to squamous cells but patient denies any symptoms to suggest UTI so seems unlikely.  Potassium is slightly low will give some oral repletion.  Her LFTs are slightly elevated but CT scan is concerning for some hepatic steatosis.  Patient can follow this up with her primary doctor   IMPRESSION: Hepatic steatosis.  Small nonobstructive left renal calculus. No hydronephrosis or renal obstruction is noted. Extensive left renal cortical scarring is noted   Will provide a copy of results for patient.  Patient expressed understanding.  Patient's pain is much better and on repeat examination she does not seem to be as tender.  She feels comfortable going home with some ibuprofen, lidocaine patches to help with pain and can follow-up with her PCP  I discussed the provisional nature of ED diagnosis, the treatment so far, the ongoing plan of care, follow up appointments and return precautions with the patient  and any family or support people present. They expressed understanding and agreed with the plan, discharged home.     ____________________________________________   FINAL CLINICAL IMPRESSION(S) / ED DIAGNOSES   Final diagnoses:  Hypokalemia  Left flank pain      MEDICATIONS GIVEN DURING THIS VISIT:  Medications  lidocaine (LIDODERM) 5 % 1 patch (1 patch Transdermal Patch Applied 09/11/21 1242)  ketorolac (TORADOL) 30 MG/ML injection 30 mg (30 mg Intramuscular Given 09/11/21 1240)  acetaminophen (TYLENOL) tablet 1,000 mg (1,000 mg Oral Given 09/11/21 1241)     ED Discharge Orders          Ordered    ibuprofen (ADVIL) 600 MG tablet  Every 6 hours PRN        09/11/21 1258    lidocaine (LIDODERM) 5 %  Every 12 hours        09/11/21 1258    potassium chloride SA (KLOR-CON) 20 MEQ tablet  Daily        09/11/21 1259             Note:  This document was prepared using Dragon voice recognition software and may include unintentional dictation errors.   Concha Se, MD 09/11/21 1300

## 2021-09-11 NOTE — ED Triage Notes (Signed)
C/O left flank pain x 1 day.  States has had pain similar to this in the past and it was a kidney infection.  AAOx3.  Skin warm and dry. NAD

## 2021-09-11 NOTE — ED Notes (Signed)
Says she noticed pain left upper quad rad around to left flank area yesterday.  Not sudden onset.  Says she  has no urinaty symptoms, no fever. It does increase with movement.  She is tender in left upper quad with palapation.

## 2021-09-11 NOTE — Discharge Instructions (Signed)
Take the ibuprofen to help with your pain.  Is important you take it with food to help prevent any issues with bleeding.  If you notice dark stools stop taking it immediately and come back to the ER.  You can also take the lidocaine patches to help with pain.  Return to the ER for worsening pain, fevers or any other concerns.  Follow-up with your primary care doctor for your slightly abnormal liver function test   IMPRESSION: Hepatic steatosis.  Small nonobstructive left renal calculus. No hydronephrosis or renal obstruction is noted. Extensive left renal cortical scarring is noted

## 2021-09-12 MED FILL — DUPIXENT 300 MG/2 ML SUBCUTANEOUS SYRINGE: SUBCUTANEOUS | 28 days supply | Qty: 4 | Fill #1

## 2021-10-02 NOTE — Unmapped (Signed)
[facility] Refill Coordination Note    Specialty Medication(s) to be Shipped:   Inflammatory Disorders: Dupixent    Other medication(s) to be shipped: No additional medications requested for fill at this time     Samantha Miles, DOB: 30-Oct-1986  Phone: [phone] (home)       All above HIPAA information was verified with patient.     Was a Nurse, learning disability used for this call? No    Completed refill call assessment today to schedule patient's medication shipment from the [facility] [phone]).  All relevant notes have been reviewed.     Specialty medication(s) and dose(s) confirmed: Regimen is correct and unchanged.   Changes to medications: Samantha Miles reports no changes at this time.  Changes to insurance: No  New side effects reported not previously addressed with a pharmacist or physician: None reported  Questions for the pharmacist: No    Confirmed patient received a Conservation officer, historic buildings and a Surveyor, mining with first shipment. The patient will receive a drug information handout for each medication shipped and additional FDA Medication Guides as required.       DISEASE/MEDICATION-SPECIFIC INFORMATION        For patients on injectable medications: Patient currently has 1 doses left.  Next injection is scheduled for 11/06.    SPECIALTY MEDICATION ADHERENCE     Medication Adherence    Patient reported X missed doses in the last month: 0  Specialty Medication: dupixent 300mg /33ml  Patient is on additional specialty medications: No  Patient is on more than two specialty medications: No  Any gaps in refill history greater than 2 weeks in the last 3 months: no  Demonstrates understanding of importance of adherence: yes  Informant: patient  Reliability of informant: reliable  Provider-estimated medication adherence level: good  Patient is at risk for Non-Adherence: No  Reasons for non-adherence: no problems identified  Confirmed plan for next specialty medication refill: delivery by pharmacy  Refills needed for supportive medications: not needed          Refill Coordination    Has the Patients' Contact Information Changed: No  Is the Shipping Address Different: No         Were doses missed due to medication being on hold? No    dupixent 300/2 mg/ml: 14 days of medicine on hand         REFERRAL TO PHARMACIST     Referral to the pharmacist: Not needed      [facility]     Shipping address confirmed in Epic.     Delivery Scheduled: Yes, Expected medication delivery date: 11/10.     Medication will be delivered via Same Day Courier to the prescription address in Epic WAM.    Antonietta Barcelona   [facility] Specialty Technician

## 2021-10-09 MED FILL — DUPIXENT 300 MG/2 ML SUBCUTANEOUS SYRINGE: SUBCUTANEOUS | 28 days supply | Qty: 4 | Fill #2

## 2021-10-26 ENCOUNTER — Ambulatory Visit
Admit: 2021-10-26 | Discharge: 2021-10-26 | Disposition: A | Discharge status: Home / Self Care | Payer: MEDICARE | Attending: Emergency Medicine

## 2021-10-26 DIAGNOSIS — J029 Acute pharyngitis, unspecified: Principal | ICD-10-CM

## 2021-10-26 MED ORDER — LIDOCAINE HCL 2 % MUCOSAL SOLUTION
OROMUCOSAL | 0 refills | 2 days | Status: CP | PRN
Start: 2021-10-26 — End: ?

## 2021-10-26 MED ADMIN — ketorolac (TORADOL) injection 60 mg: 60 mg | INTRAMUSCULAR | @ 16:00:00 | Stop: 2021-10-26

## 2021-10-26 NOTE — Unmapped (Signed)
[facility]  Emergency Department Provider Note        ED Clinical Impression     Final diagnoses:   Pharyngitis, unspecified etiology (Primary)       Initial Impression, ED Course, Assessment and Plan     Impression: Samantha Miles is a 35 y.o. female with history of TIAs, nephrolithiasis, and pyelnonephritis presenting with 1 day of bilateral sore throat    Initial vital signs are within normal range. On exam, patient is well appearing and in NAD. Mild pharyngeal erythema. No tonsillar exudate.     Differential diagnosis includes viral pharyngitis versus strep versus flu, no findings at this time to suggest deep space abscess or PTA.    Plan for Flu/RSV/COVID-19 PCR and Strep Antigen. Will give Toradol.    10:49 AM   Flu/RSV/COVID-19 PCR and Strep negative.  Strongly suspect viral pharyngitis.  She will be discharged with viscous lidocaine solution. I have discussed treatment plan and return precautions extensively. Patient voiced a clear understanding and was amenable with the plan.      ____________________________________________    Time seen: October 26, 2021 10:29 AM    I have reviewed the triage vital signs and the nursing notes.       History     Chief Complaint  Sore Throat      HPI   Samantha Miles is a 35 y.o. female with history of TIAs, nephrolithiasis, and pyelnonephritis presenting with sore throat. The patent reports 1 day of bilateral sore throat. She has taken Tylenol without relief of symptoms. Her daughter at home has pink eye but no other sick contact. The patient denies fever, cough, shortness of breath, chest pain.  Pain is exacerbated with swallowing but is able to tolerate p.o.       Past Medical History:   Diagnosis Date   ??? Depression    ??? Eczema        Past Surgical History:   Procedure Laterality Date   ??? INNER EAR SURGERY     ??? MIDDLE EAR SURGERY     ??? TUBAL LIGATION         No current facility-administered medications for this encounter.    Current Outpatient Medications:   ???  betamethasone dipropionate (DIPROLENE) 0.05 % ointment, betamethasone dipropionate 0.05 % topical ointment, Disp: , Rfl:   ???  betamethasone, augmented, (DIPROLENE) 0.05 % cream, Apply twice daily to areas of eczema until flat and smooth. Avoid face and skin folds, Disp: 50 g, Rfl: 5  ???  clobetasoL (TEMOVATE) 0.05 % ointment, Apply to eczema on hands at night until resolved., Disp: 60 g, Rfl: 5  ???  dupilumab (DUPIXENT SYRINGE) 300 mg/2 mL Syrg injection, Inject the contents of 1 syringe (300 mg total) under the skin every fourteen (14) days., Disp: 4 mL, Rfl: 5  ???  empty container Misc, Use as directed to dispose of Dupixent syringes., Disp: 1 each, Rfl: 2  ???  fexofenadine (ALLEGRA) 180 MG tablet, Take 180 mg by mouth daily., Disp: , Rfl: 11  ???  guaiFENesin 200 mg tablet, Take 2 tablets (400 mg total) by mouth every six (6) hours as needed for congestion., Disp: 30 tablet, Rfl: 0  ???  hydroCHLOROthiazide (HYDRODIURIL) 12.5 MG tablet, Take 12.5 mg by mouth daily., Disp: , Rfl:   ???  ibuprofen (ADVIL,MOTRIN) 800 MG tablet, TAKE 1 TABLET BY MOUTH EVERY 8 HOURS AS NEEDED FOR MODERATE PAIN, Disp: , Rfl: 0  ???  meloxicam (  MOBIC) 15 MG tablet, Mobic 15 mg tablet  Take 1 tablet(s) every day by oral route., Disp: , Rfl:   ???  nystatin (MYCOSTATIN) 100,000 unit/gram ointment, Apply 1 application topically Two (2) times a day. To scaly areas on feet and between toes, Disp: 30 g, Rfl: 5  ???  nystatin (MYCOSTATIN) powder, , Disp: , Rfl:   ???  ondansetron (ZOFRAN) 4 MG tablet, Take 4 mg by mouth., Disp: , Rfl:   ???  oxyCODONE (ROXICODONE) 5 MG immediate release tablet, Take 1 tablet (5 mg total) by mouth every four (4) hours as needed for pain for up to 12 doses., Disp: 12 tablet, Rfl: 0  ???  PARoxetine (PAXIL) 40 MG tablet, , Disp: , Rfl:   ???  TRUEPLUS PEN NEEDLE 31 gauge x 1/4 (6 mm) Ndle, , Disp: , Rfl:   ???  VICTOZA 2-PAK 0.6 mg/0.1 mL (18 mg/3 mL) injection, , Disp: , Rfl:     Allergies  Sulfacetamide sodium and Sulfamethoxazole    Family History   Problem Relation Age of Onset   ??? Multiple sclerosis Mother    ??? Melanoma Neg Hx    ??? Basal cell carcinoma Neg Hx    ??? Squamous cell carcinoma Neg Hx        Social History  Social History     Tobacco Use   ??? Smoking status: Never   ??? Smokeless tobacco: Never   Substance Use Topics   ??? Alcohol use: No   ??? Drug use: No       Review of Systems   Constitutional: Negative for fever.  Eyes: Negative for visual changes.  ENT: Positive for sore throat.  Cardiovascular: Negative for chest pain.  Respiratory: Negative for shortness of breath.  Gastrointestinal: Negative for abdominal pain, vomiting or diarrhea.  Genitourinary: Negative for dysuria.  Musculoskeletal: Negative for back pain.  Skin: Negative for rash.  Neurological: Negative for headaches, focal weakness or numbness.      Physical Exam     VITAL SIGNS:    ED Triage Vitals [10/26/21 0904]   Enc Vitals Group      BP 139/86      Heart Rate 77      SpO2 Pulse       Resp 18      Temp 36.9 ??C (98.5 ??F)      Temp Source Oral      SpO2 96 %     Constitutional: Alert and oriented. Well appearing and in no distress.  Eyes: Conjunctivae are normal.  ENT       Head: Normocephalic and atraumatic.       Nose: No congestion.       Mouth/Throat: Mild pharyngeal erythema. No tonsillar exudate.        Neck: No stridor.  Cardiovascular: Normal rate, regular rhythm. Normal and symmetric distal pulses are present in all extremities.  Respiratory: Normal respiratory effort. Breath sounds are normal.  Gastrointestinal: Soft and nontender. There is no CVA tenderness.  Musculoskeletal: Nontender with normal range of motion in all extremities.  Neurologic: Normal speech and language. No gross focal neurologic deficits are appreciated.  Skin: Skin is warm, dry and intact. No rash noted.  Psychiatric: Mood and affect are normal. Speech and behavior are normal.    Pertinent labs & imaging results that were available during my care of the patient were reviewed by me and considered in my medical decision making (see chart for details).    Documentation assistance  was provided by Debby Freiberg Casimiro Needle) Durenda Age, on October 26, 2021 at 10:32 for Shaune Leeks, MD.    October 27, 2021 7:47 AM. Documentation assistance provided by the scribe. I was present during the time the encounter was recorded. The information recorded by the scribe was done at my direction and has been reviewed and validated by me.        Sherryl Barters, MD  10/27/21 [phone]

## 2021-10-26 NOTE — Unmapped (Signed)
Pt reports having a sore throat that began yesterday.

## 2021-11-05 NOTE — Unmapped (Signed)
[facility] Refill Coordination Note    Specialty Medication(s) to be Shipped:   Inflammatory Disorders: Dupixent    Other medication(s) to be shipped: No additional medications requested for fill at this time     Samantha Miles, DOB: 02/19/1986  Phone: [phone] (home)       All above HIPAA information was verified with patient.     Was a Nurse, learning disability used for this call? No    Completed refill call assessment today to schedule patient's medication shipment from the [facility] [phone]).  All relevant notes have been reviewed.     Specialty medication(s) and dose(s) confirmed: Regimen is correct and unchanged.   Changes to medications: Samantha Miles reports no changes at this time.  Changes to insurance: No  New side effects reported not previously addressed with a pharmacist or physician: None reported  Questions for the pharmacist: No    Confirmed patient received a Conservation officer, historic buildings and a Surveyor, mining with first shipment. The patient will receive a drug information handout for each medication shipped and additional FDA Medication Guides as required.       DISEASE/MEDICATION-SPECIFIC INFORMATION        For patients on injectable medications: Patient currently has 1 doses left.  Next injection is scheduled for 12/11.    SPECIALTY MEDICATION ADHERENCE     Medication Adherence    Patient reported X missed doses in the last month: 0  Specialty Medication: Dupixent 300 mg/2 ml  Patient is on additional specialty medications: No              Were doses missed due to medication being on hold? No        REFERRAL TO PHARMACIST     Referral to the pharmacist: Not needed      [facility]     Shipping address confirmed in Epic.     Delivery Scheduled: Yes, Expected medication delivery date: 12/14.     Medication will be delivered via Same Day Courier to the prescription address in Epic WAM.    Samantha Miles   [facility] Specialty Technician

## 2021-11-11 DIAGNOSIS — L309 Dermatitis, unspecified: Principal | ICD-10-CM

## 2021-11-11 DIAGNOSIS — L209 Atopic dermatitis, unspecified: Principal | ICD-10-CM

## 2021-11-12 MED FILL — DUPIXENT 300 MG/2 ML SUBCUTANEOUS SYRINGE: SUBCUTANEOUS | 28 days supply | Qty: 4 | Fill #3

## 2021-11-19 ENCOUNTER — Other Ambulatory Visit: Payer: Self-pay

## 2021-11-19 ENCOUNTER — Ambulatory Visit: Admit: 2021-11-19 | Discharge status: Against Medical Advice | Payer: Medicare HMO

## 2021-11-19 ENCOUNTER — Ambulatory Visit
Admission: EM | Admit: 2021-11-19 | Discharge: 2021-11-19 | Disposition: A | Discharge status: Home / Self Care | Payer: Medicare HMO | Attending: Medical Oncology | Admitting: Medical Oncology

## 2021-11-19 ENCOUNTER — Encounter: Payer: Self-pay | Admitting: Emergency Medicine

## 2021-11-19 DIAGNOSIS — K219 Gastro-esophageal reflux disease without esophagitis: Secondary | ICD-10-CM

## 2021-11-19 DIAGNOSIS — J029 Acute pharyngitis, unspecified: Secondary | ICD-10-CM | POA: Diagnosis not present

## 2021-11-19 LAB — POCT RAPID STREP A (OFFICE): Rapid Strep A Screen: NEGATIVE

## 2021-11-19 MED ORDER — LIDOCAINE VISCOUS HCL 2 % MT SOLN: 5.0000 mL | Freq: Four times a day (QID) | OROMUCOSAL | 0 refills | Status: DC | PRN

## 2021-11-19 MED ORDER — PANTOPRAZOLE SODIUM 40 MG PO TBEC: 40.0000 mg | DELAYED_RELEASE_TABLET | Freq: Every day | ORAL | 0 refills | Status: DC

## 2021-11-19 MED ORDER — DEXAMETHASONE SODIUM PHOSPHATE 10 MG/ML IJ SOLN
10.0000 mg | Freq: Once | INTRAMUSCULAR | Status: AC
  Administered 2021-11-19: 16:00:00 10 mg via INTRAMUSCULAR

## 2021-11-19 NOTE — ED Provider Notes (Signed)
Roderic Palau    CSN: [id number] Arrival date & time: 11/19/21  1414      History   Chief Complaint Chief Complaint  Patient presents with   Sore Throat    HPI Brittney Sanders is a 35 y.o. female.   HPI  Sore Throat: Pt reports that for the past month she has had a sore throat. Feels like sensation is in her esophagus near where her collarbone is. Feels raw and hurts to swallow. Pain rated as severe to the point where she has pain with talking and has not eaten as much as normal due to pain. Feels like food gets stuck but has not had any choking episodes. No fevers, cold symptoms, vomiting, fevers. She has a history of GERD and has tried pepcid for symptoms which was prescribed by her ENT physician Dr. Tami Ribas. She reports that he referred her to GI for endoscopy for her symptoms but the process is taking longer than expected for scheduling.   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      Home Medications    Prior to Admission medications   Medication Sig Start Date End Date Taking? Authorizing Provider  magic mouthwash (lidocaine, diphenhydrAMINE, alum & mag hydroxide) suspension Swish  and swallow 5 mLs 4 (four) times daily as needed for mouth pain. 11/19/21  Yes Meril Dray, Judson Roch M, PA-C  pantoprazole (PROTONIX) 40 MG tablet Take 1 tablet (40 mg total) by mouth daily. 11/19/21 12/19/21 Yes Hughie Closs, PA-C  DUPIXENT 300 MG/2ML prefilled syringe  10/03/19   [provider]  Erythromycin 2 % PADS Apply 1 application topically daily. 06/10/21   Felipa Furnace, DPM  famotidine (PEPCID) 20 MG tablet Take 1 tablet (20 mg total) by mouth 2 (two) times daily. 10/13/19   Carrie Mew, MD  fexofenadine (ALLEGRA) 180 MG tablet Take 180 mg by mouth daily.    [provider]  meloxicam (MOBIC) 15 MG tablet Take 15 mg by mouth daily.    [provider]  metoCLOPramide (REGLAN) 10 MG tablet Take 1 tablet (10 mg total) by mouth every 6 (six) hours as needed. 10/13/19   Carrie Mew, MD  naproxen (NAPROSYN) 500 MG tablet Take 1 tablet (500 mg total) by mouth 2 (two) times daily with a meal. 10/13/19   Carrie Mew, MD  nortriptyline (PAMELOR) 10 MG capsule Increase nortriptyline to 30 mg nightly for two weeks, then increase to 40 mg nightly, refilled. 10/02/19   [provider]  nystatin (MYCOSTATIN/NYSTOP) powder  05/16/16   [provider]  ondansetron (ZOFRAN ODT) 4 MG disintegrating tablet Take 1 tablet (4 mg total) by mouth every 8 (eight) hours as needed for nausea or vomiting. 10/21/19   Paulette Blanch, MD  ondansetron (ZOFRAN) 4 MG tablet Take 1 tablet (4 mg total) by mouth every 8 (eight) hours as needed for nausea or vomiting. 09/09/18   Schuyler Amor, MD  PARoxetine (PAXIL) 40 MG tablet Take 40 mg by mouth 2 (two) times a day.    [provider]  potassium chloride SA (KLOR-CON) 20 MEQ tablet Take 1 tablet (20 mEq total) by mouth daily for 5 days. 09/11/21 09/16/21  Vanessa Duncombe, MD    Family History Family History  Problem Relation Age of Onset   Urolithiasis Maternal Grandmother    Kidney disease Neg Hx     Bladder Cancer Neg Hx    Kidney cancer Neg Hx     Social History Social History   Tobacco Use   Smoking status: Never   Smokeless tobacco: Never  Vaping Use   Vaping Use: Never used  Substance Use Topics   Alcohol use: No    Alcohol/week: 0.0 standard drinks   Drug use: No     Allergies   Sulfa antibiotics and Sulfamethoxazole   Review of Systems Review of Systems  As stated above in HPI Physical Exam Triage Vital Signs ED Triage Vitals  Enc Vitals Group     BP 11/19/21 1452 126/86     Pulse Rate 11/19/21 1452 86     Resp 11/19/21 1452 18     Temp 11/19/21 1452 98.9 F (37.2 C)     Temp Source 11/19/21 1452 Oral     SpO2 11/19/21 1452 98 %     Weight --      Height --      Head Circumference --      Peak Flow --      Pain Score 11/19/21 1510 5     Pain Loc --      Pain Edu? --      Excl. in [place]? --    No data found.  Updated Vital Signs BP 126/86 (BP Location: Left Arm)    Pulse 86    Temp 98.9 F (37.2 C) (Oral)    Resp 18    SpO2 98%   Physical Exam Vitals and nursing note reviewed.  Constitutional:      General: She is not in acute distress.    Appearance: Normal appearance. She is not ill-appearing, toxic-appearing or diaphoretic.  HENT:     Head: Normocephalic and atraumatic.     Nose: Nose normal. No congestion or rhinorrhea.     Mouth/Throat:     Mouth: Mucous membranes are moist.     Pharynx: Oropharynx is clear. No oropharyngeal exudate or posterior oropharyngeal erythema.     Comments: Voice is normal. No increased saliva  Eyes:     Extraocular Movements: Extraocular movements intact.     Pupils: Pupils are equal, round, and reactive to light.  Neck:     Comments: NO thyromegaly or palpable masses  Cardiovascular:     Rate and Rhythm: Normal rate and regular rhythm.     Heart sounds: Normal heart sounds.  Pulmonary:     Effort: Pulmonary effort is normal.     Breath sounds: Normal breath sounds.  Musculoskeletal:     Cervical back:  Normal range of motion and neck supple. No rigidity or tenderness.  Lymphadenopathy:  Cervical: No cervical adenopathy.  Skin:    General: Skin is warm.  Neurological:     Mental Status: She is alert and oriented to person, place, and time.     UC Treatments / Results  Labs (all labs ordered are listed, but only abnormal results are displayed) Labs Reviewed  POCT RAPID STREP A (OFFICE)    EKG   Radiology No results found.  Procedures Procedures (including critical care time)  Medications Ordered in UC Medications  dexamethasone (DECADRON) injection 10 mg (has no administration in time range)    Initial Impression / Assessment and Plan / UC Course  I have reviewed the triage vital signs and the nursing notes.  Pertinent labs & imaging results that were available during my care of the patient were reviewed by me and considered in my medical decision making (see chart for details).     New. Will need endoscopy. NAD at this time but we discussed red flag signs and symptoms. For now will screen for strep for completeness given severity of symptoms as this has not been completed. In addition we will start her on protonix 40 mg, [place]ukes magic mouthwash swish and swallow and a decadron shot to help with any inflammation and to avoid oral steroids which could worsen GERD.    Final Clinical Impressions(s) / UC Diagnoses   Final diagnoses:  Sore throat  Gastroesophageal reflux disease, unspecified whether esophagitis present   Discharge Instructions   None    ED Prescriptions     Medication Sig Dispense Auth. Provider   magic mouthwash (lidocaine, diphenhydrAMINE, alum & mag hydroxide) suspension Swish and swallow 5 mLs 4 (four) times daily as needed for mouth pain. 360 mL Lakynn Halvorsen M, PA-C   pantoprazole (PROTONIX) 40 MG tablet Take 1 tablet (40 mg total) by mouth daily. 30 tablet Rushie Chestnut, [place]      PDMP not reviewed this encounter.   Rushie Chestnut, [place] 11/19/21 1559

## 2021-11-19 NOTE — ED Triage Notes (Signed)
Pt here with low throat pain for over 1 month. Takes acid reflux medicine. Hurts when swallowing.

## 2021-12-02 ENCOUNTER — Ambulatory Visit: Payer: Medicare HMO | Admitting: Podiatry

## 2021-12-05 NOTE — Unmapped (Signed)
[facility] Refill Coordination Note    Specialty Medication(s) to be Shipped:   Inflammatory Disorders: Dupixent    Other medication(s) to be shipped: No additional medications requested for fill at this time     Samantha Miles, DOB: 05/30/1986  Phone: [phone] (home)       All above HIPAA information was verified with patient.     Was a Nurse, learning disability used for this call? No    Completed refill call assessment today to schedule patient's medication shipment from the [facility] [phone]).  All relevant notes have been reviewed.     Specialty medication(s) and dose(s) confirmed: Regimen is correct and unchanged.   Changes to medications: Samantha Miles reports no changes at this time.  Changes to insurance: No  New side effects reported not previously addressed with a pharmacist or physician: None reported  Questions for the pharmacist: No    Confirmed patient received a Conservation officer, historic buildings and a Surveyor, mining with first shipment. The patient will receive a drug information handout for each medication shipped and additional FDA Medication Guides as required.       DISEASE/MEDICATION-SPECIFIC INFORMATION        For patients on injectable medications: Patient currently has 1 doses left.  Next injection is scheduled for 12/07/2021.    SPECIALTY MEDICATION ADHERENCE     Medication Adherence    Patient reported X missed doses in the last month: 0  Specialty Medication: Dupixent 300 mg/2 ml  Patient is on additional specialty medications: No  Any gaps in refill history greater than 2 weeks in the last 3 months: no  Demonstrates understanding of importance of adherence: yes  Informant: patient  Reliability of informant: reliable  Confirmed plan for next specialty medication refill: delivery by pharmacy  Refills needed for supportive medications: not needed              Were doses missed due to medication being on hold? No        REFERRAL TO PHARMACIST     Referral to the pharmacist: Not needed      [facility]     Shipping address confirmed in Epic.     Delivery Scheduled: Yes, Expected medication delivery date: 12/12/2021.     Medication will be delivered via Same Day Courier to the prescription address in Epic WAM.    Samantha Miles Samantha Miles   [facility] Specialty Technician

## 2021-12-12 MED FILL — DUPIXENT 300 MG/2 ML SUBCUTANEOUS SYRINGE: SUBCUTANEOUS | 28 days supply | Qty: 4 | Fill #4

## 2021-12-18 ENCOUNTER — Ambulatory Visit (INDEPENDENT_AMBULATORY_CARE_PROVIDER_SITE_OTHER): Payer: Medicare HMO | Admitting: Podiatry

## 2021-12-18 ENCOUNTER — Other Ambulatory Visit: Payer: Self-pay

## 2021-12-18 DIAGNOSIS — L988 Other specified disorders of the skin and subcutaneous tissue: Secondary | ICD-10-CM | POA: Diagnosis not present

## 2021-12-18 DIAGNOSIS — L081 Erythrasma: Secondary | ICD-10-CM | POA: Diagnosis not present

## 2021-12-18 NOTE — Progress Notes (Signed)
r  Subjective:  Patient ID: Brittney Sanders, female    DOB: 1985/12/09,  MRN: [id number]  Chief Complaint  Patient presents with   Foot Pain    Pt stated that she is still having issues with her feet itching     36 y.o. female presents with the above complaint.  Patient presents with follow-up of left second and third interdigital space maceration/itching.  Patient states that she has had little bit improvement with erythromycin pad.  There is still some itching present.  She would like to discuss next treatment plans.    Review of Systems: Negative except as noted in the HPI. Denies N/V/F/Ch.  Past Medical History:  Diagnosis Date   Depression    Eczema    Hearing deficit    Heel spur    Kidney stone    Left nephrolithiasis    Nocturia    Obesity    Overweight    Plantar fasciitis    Seasonal allergies    UTI (lower urinary tract infection)     Current Outpatient Medications:    DUPIXENT 300 MG/2ML prefilled syringe, , Disp: , Rfl:    Erythromycin 2 % PADS, Apply 1 application topically daily., Disp: 60 each, Rfl: 0   famotidine (PEPCID) 20 MG tablet, Take 1 tablet (20 mg total) by mouth 2 (two) times daily., Disp: 60 tablet, Rfl: 0   fexofenadine (ALLEGRA) 180 MG tablet, Take 180 mg by mouth daily., Disp: , Rfl:    magic mouthwash (lidocaine, diphenhydrAMINE, alum & mag hydroxide) suspension, Swish and swallow 5 mLs 4 (four) times daily as needed for mouth pain., Disp: 360 mL, Rfl: 0   meloxicam (MOBIC) 15 MG tablet, Take 15 mg by mouth daily., Disp: , Rfl:    metoCLOPramide (REGLAN) 10 MG tablet, Take 1 tablet (10 mg total) by mouth every 6 (six) hours as needed., Disp: 30 tablet, Rfl: 0   naproxen (NAPROSYN) 500 MG tablet, Take 1 tablet (500 mg total) by mouth 2 (two) times daily with a meal., Disp: 20 tablet, Rfl: 0   nortriptyline (PAMELOR) 10 MG capsule, Increase nortriptyline to 30 mg nightly for two weeks, then increase to 40 mg nightly, refilled., Disp: , Rfl:     nystatin (MYCOSTATIN/NYSTOP) powder, , Disp: , Rfl:    ondansetron (ZOFRAN ODT) 4 MG disintegrating tablet, Take 1 tablet (4 mg total) by mouth every 8 (eight) hours as needed for nausea or vomiting., Disp: 15 tablet, Rfl: 0   ondansetron (ZOFRAN) 4 MG tablet, Take 1 tablet (4 mg total) by mouth every 8 (eight) hours as needed for nausea or vomiting., Disp: 8 tablet, Rfl: 0   pantoprazole (PROTONIX) 40 MG tablet, Take 1 tablet (40 mg total) by mouth daily., Disp: 30 tablet, Rfl: 0   PARoxetine (PAXIL) 40 MG tablet, Take 40 mg by mouth 2 (two) times a day., Disp: , Rfl:    potassium chloride SA (KLOR-CON) 20 MEQ tablet, Take 1 tablet (20 mEq total) by mouth daily for 5 days., Disp: 5 tablet, Rfl: 0  Social History   Tobacco Use  Smoking Status Never  Smokeless Tobacco Never    Allergies  Allergen Reactions   Sulfa Antibiotics Hives and Rash   Sulfamethoxazole Rash   Objective:  There were no vitals filed for this visit. There is no height or weight on file to calculate BMI. Constitutional Well developed. Well nourished.  Vascular Dorsalis pedis pulses palpable bilaterally. Posterior tibial pulses palpable bilaterally. Capillary refill normal to all  digits.  No cyanosis or clubbing noted. Pedal Drier growth normal.  Neurologic Normal speech. Oriented to person, place, and time. Epicritic sensation to light touch grossly present bilaterally.  Dermatologic Epidermal lysis with subjective component of itching noted to interdigital space with worsening left second and third interdigital space.  No plantar athlete's foot noted.  Mild maceration noted in between the toes  Orthopedic: Normal joint ROM without pain or crepitus bilaterally. No visible deformities. No bony tenderness.   Radiographs: None Assessment:   1. Maceration of skin   2. Erythrasma     Plan:  Patient was evaluated and treated and all questions answered.  Left second and third interdigital space erythrasma  versus tinea pedis -I explained to the patient the etiology of interspace erythrasma versus treatment options were discussed.   -We will hold off on erythromycin pads for now. -I have instructed her to do Betadine wet-to-dry dressing changes once a day for the next 6 weeks to see if that resolves with some of the maceration that is present. -It appears to be improving slowly.  No follow-ups on file.

## 2022-01-01 NOTE — Unmapped (Signed)
LOV:  08/31/21    Dr. Herbie Drape,    This patient is asking for a cost-effective alternative to Dupixent.      I uploaded a form that lists alternatives from [facility] as a reference.    I think it would be easiest to just e-scribe an alternative.     Clydie Braun

## 2022-01-05 NOTE — Unmapped (Signed)
[facility] Refill Coordination Note    Specialty Medication(s) to be Shipped:   Inflammatory Disorders: Dupixent    Other medication(s) to be shipped: No additional medications requested for fill at this time     Samantha Miles, DOB: 1986/01/27  Phone: [phone] (home)       All above HIPAA information was verified with patient.     Was a Nurse, learning disability used for this call? No    Completed refill call assessment today to schedule patient's medication shipment from the [facility] [phone]).  All relevant notes have been reviewed.     Specialty medication(s) and dose(s) confirmed: Regimen is correct and unchanged.   Changes to medications: Samantha Miles reports no changes at this time.  Changes to insurance: No  New side effects reported not previously addressed with a pharmacist or physician: None reported  Questions for the pharmacist: No    Confirmed patient received a Conservation officer, historic buildings and a Surveyor, mining with first shipment. The patient will receive a drug information handout for each medication shipped and additional FDA Medication Guides as required.       DISEASE/MEDICATION-SPECIFIC INFORMATION        For patients on injectable medications: Patient currently has 0 doses left.  Next injection is scheduled for 01/18/2022.    SPECIALTY MEDICATION ADHERENCE     Medication Adherence    Patient reported X missed doses in the last month: 0  Specialty Medication: Dupixent  Patient is on additional specialty medications: No  Any gaps in refill history greater than 2 weeks in the last 3 months: no  Demonstrates understanding of importance of adherence: yes  Informant: patient  Reliability of informant: reliable  Confirmed plan for next specialty medication refill: delivery by pharmacy  Refills needed for supportive medications: not needed              Were doses missed due to medication being on hold? No        REFERRAL TO PHARMACIST     Referral to the pharmacist: Not needed      [facility]     Shipping address confirmed in Epic.     Delivery Scheduled: Yes, Expected medication delivery date: 01/09/2022.     Medication will be delivered via Same Day Courier to the prescription address in Epic WAM.    Samantha Miles D Samantha Miles   [facility] Specialty Technician

## 2022-01-09 MED FILL — DUPIXENT 300 MG/2 ML SUBCUTANEOUS SYRINGE: SUBCUTANEOUS | 28 days supply | Qty: 4 | Fill #5

## 2022-01-15 NOTE — Unmapped (Signed)
LOV:  09/03/21    Hi Dr Herbie Drape,    I spoke to the patient who said she is happy with Dupixent ,but would like to adjust the dosage so that she takes it less often, say monthly instead.     Would you like her to come in for a visit to discuss this?    Let me know how you would like me to follow up here.     Clydie Braun

## 2022-01-23 MED ORDER — DUPILUMAB 300 MG/2 ML SUBCUTANEOUS PEN INJECTOR
SUBCUTANEOUS | 5 refills | 0.00000 days
Start: 2022-01-23 — End: ?

## 2022-01-23 NOTE — Unmapped (Signed)
Refill request for Dupixent.    Patient requested switch from syringe to pen injector.     Medication pended if appropriate.

## 2022-01-23 NOTE — Unmapped (Signed)
Dr Herbie Drape,    I left a MyChart message explaining the Dupixent Taper.    Clydie Braun

## 2022-01-26 MED ORDER — DUPILUMAB 300 MG/2 ML SUBCUTANEOUS PEN INJECTOR
SUBCUTANEOUS | 7 refills | 0.00000 days | Status: CP
Start: 2022-01-26 — End: ?
  Filled 2022-02-06: qty 4, 28d supply, fill #0

## 2022-01-27 ENCOUNTER — Other Ambulatory Visit: Payer: Self-pay

## 2022-01-27 ENCOUNTER — Emergency Department
Admission: EM | Admit: 2022-01-27 | Discharge: 2022-01-27 | Disposition: A | Discharge status: Home / Self Care | Payer: Medicare HMO | Attending: Emergency Medicine | Admitting: Emergency Medicine

## 2022-01-27 ENCOUNTER — Emergency Department: Payer: Medicare HMO

## 2022-01-27 DIAGNOSIS — N132 Hydronephrosis with renal and ureteral calculous obstruction: Secondary | ICD-10-CM | POA: Insufficient documentation

## 2022-01-27 DIAGNOSIS — R102 Pelvic and perineal pain: Secondary | ICD-10-CM

## 2022-01-27 DIAGNOSIS — N200 Calculus of kidney: Secondary | ICD-10-CM

## 2022-01-27 DIAGNOSIS — E876 Hypokalemia: Secondary | ICD-10-CM | POA: Insufficient documentation

## 2022-01-27 DIAGNOSIS — R1030 Lower abdominal pain, unspecified: Secondary | ICD-10-CM | POA: Diagnosis present

## 2022-01-27 DIAGNOSIS — N939 Abnormal uterine and vaginal bleeding, unspecified: Secondary | ICD-10-CM

## 2022-01-27 LAB — POC URINE PREG, ED: Preg Test, Ur: NEGATIVE

## 2022-01-27 LAB — CBC
HCT: 45.3 % (ref 36.0–46.0)
Hemoglobin: 15.1 g/dL — ABNORMAL HIGH (ref 12.0–15.0)
MCH: 29 pg (ref 26.0–34.0)
MCHC: 33.3 g/dL (ref 30.0–36.0)
MCV: 86.9 fL (ref 80.0–100.0)
Platelets: 361 10*3/uL (ref 150–400)
RBC: 5.21 MIL/uL — ABNORMAL HIGH (ref 3.87–5.11)
RDW: 13.2 % (ref 11.5–15.5)
WBC: 8.1 10*3/uL (ref 4.0–10.5)
nRBC: 0 % (ref 0.0–0.2)

## 2022-01-27 LAB — COMPREHENSIVE METABOLIC PANEL
ALT: 44 U/L (ref 0–44)
AST: 44 U/L — ABNORMAL HIGH (ref 15–41)
Albumin: 4.4 g/dL (ref 3.5–5.0)
Alkaline Phosphatase: 63 U/L (ref 38–126)
Anion gap: 10 (ref 5–15)
BUN: 11 mg/dL (ref 6–20)
CO2: 30 mmol/L (ref 22–32)
Calcium: 9.9 mg/dL (ref 8.9–10.3)
Chloride: 102 mmol/L (ref 98–111)
Creatinine, Ser: 0.82 mg/dL (ref 0.44–1.00)
GFR, Estimated: >60 mL/min (ref >60)
Glucose, Bld: 98 mg/dL (ref 70–99)
Potassium: 3.1 mmol/L — ABNORMAL LOW (ref 3.5–5.1)
Sodium: 142 mmol/L (ref 135–145)
Total Bilirubin: 0.5 mg/dL (ref 0.3–1.2)
Total Protein: 7.4 g/dL (ref 6.5–8.1)

## 2022-01-27 LAB — URINALYSIS, ROUTINE W REFLEX MICROSCOPIC
Bacteria, UA: NONE SEEN
Bilirubin Urine: NEGATIVE
Glucose, UA: NEGATIVE mg/dL
Ketones, ur: NEGATIVE mg/dL
Nitrite: NEGATIVE
Protein, ur: NEGATIVE mg/dL
Specific Gravity, Urine: 1.016 (ref 1.005–1.030)
pH: 7 (ref 5.0–8.0)

## 2022-01-27 LAB — LIPASE, BLOOD: Lipase: 34 U/L (ref 11–51)

## 2022-01-27 MED ORDER — HYDROCODONE-ACETAMINOPHEN 5-325 MG PO TABS
1.0000 | ORAL_TABLET | Freq: Once | ORAL | Status: AC
  Administered 2022-01-27: 1 via ORAL
  Filled 2022-01-27: qty 1

## 2022-01-27 MED ORDER — POTASSIUM CHLORIDE CRYS ER 20 MEQ PO TBCR
40.0000 meq | EXTENDED_RELEASE_TABLET | Freq: Once | ORAL | Status: AC
  Administered 2022-01-27: 40 meq via ORAL
  Filled 2022-01-27: qty 2

## 2022-01-27 MED ORDER — HYDROCODONE-ACETAMINOPHEN 5-325 MG PO TABS: 1.0000 | ORAL_TABLET | ORAL | 0 refills | Status: AC | PRN

## 2022-01-27 NOTE — ED Provider Notes (Signed)
[facility] Provider Note    Event Date/Time   First MD Initiated Contact with Patient 01/27/22 1102     (approximate)   History   Chief Complaint Abdominal Pain   HPI Brittney Sanders is a 36 y.o. female, history of nephrolithiasis, presents to the emergency department for evaluation of lower abdominal pain and vaginal bleeding.  Patient states that she began experiencing lower abdominal pain approximately 2 weeks ago.  Describes as 8/10, midline.  Additionally reports vaginal bleeding, though only notes small amount when wiping.  Denies fever/chills, urinary symptoms, diarrhea, rectal bleeding, headache, rashes, dizziness, weakness, chest pain, vaginal discharge/itchiness, or shortness of breath  History Limitations: No limitations.      Physical Exam  Triage Vital Signs: ED Triage Vitals  Enc Vitals Group     BP 01/27/22 1047 131/89     Pulse Rate 01/27/22 1047 74     Resp 01/27/22 1047 17     Temp 01/27/22 1047 98 F (36.7 C)     Temp Source 01/27/22 1047 Oral     SpO2 01/27/22 1047 100 %     Weight 01/27/22 1046 262 lb (118.8 kg)     Height 01/27/22 1046 5\' 3"  (1.6 m)     Head Circumference --      Peak Flow --      Pain Score --      Pain Loc --      Pain Edu? --      Excl. in GC? --     Most recent vital signs: Vitals:   01/27/22 1356 01/27/22 1523  BP: 130/88 126/79  Pulse: 70 83  Resp: 16 17  Temp:    SpO2: 100% 100%    General: Awake, NAD.  CV: Good peripheral perfusion.  Resp: Normal effort.  Lung sounds clear bilaterally. Abd: Soft, non-tender. No distention.  Neuro: At baseline. No gross neurological deficits. Other: No CVA tenderness.  Pelvic exam unremarkable, closed cervical os, small amounts of blood located in the vaginal vault.  No abnormal discharge.  Cervix nonfriable.  Vulva unremarkable, no evidence of swelling or infection.  Physical Exam    ED Results / Procedures / Treatments  Labs (all labs ordered are  listed, but only abnormal results are displayed) Labs Reviewed  COMPREHENSIVE METABOLIC PANEL - Abnormal; Notable for the following components:      Result Value   Potassium 3.1 (*)    AST 44 (*)    All other components within normal limits  CBC - Abnormal; Notable for the following components:   RBC 5.21 (*)    Hemoglobin 15.1 (*)    All other components within normal limits  URINALYSIS, ROUTINE W REFLEX MICROSCOPIC - Abnormal; Notable for the following components:   Color, Urine YELLOW (*)    APPearance CLEAR (*)    Hgb urine dipstick LARGE (*)    Leukocytes,Ua TRACE (*)    All other components within normal limits  LIPASE, BLOOD  POC URINE PREG, ED     EKG Not applicable.   RADIOLOGY  ED Provider Interpretation: I personally reviewed and interpreted these images.  CT renal stone shows evidence of nephrolithiasis.  Negative ultrasound  CT Renal Stone Study  Result Date: 01/27/2022 CLINICAL DATA:  Nephrolithiasis. Lower abdominal pain with vaginal swelling and blood. History of urinary reflux. EXAM: CT ABDOMEN AND PELVIS WITHOUT CONTRAST TECHNIQUE: Multidetector CT imaging of the abdomen and pelvis was performed following the standard protocol without IV contrast. RADIATION  DOSE REDUCTION: This exam was performed according to the departmental dose-optimization program which includes automated exposure control, adjustment of the mA and/or kV according to patient size and/or use of iterative reconstruction technique. COMPARISON:  CT September 11, 2021 FINDINGS: Lower chest: No acute abnormality. Hepatobiliary: Hepatic steatosis with focal fatty sparing along the gallbladder fossa. Gallbladder is unremarkable. No biliary ductal dilation. Pancreas: No pancreatic ductal dilation or evidence of acute inflammation. Spleen: No splenomegaly or focal splenic lesion. Adrenals/Urinary Tract: Bilateral adrenal glands appear normal. No hydronephrosis. Nonobstructive left-sided renal stones measuring  up to 4 mm in the lower pole. No obstructive ureteral or bladder calculi identified. Multifocal left cortical renal scarring with lobular contour, similar to prior. Exophytic 8 mm right lower pole renal cyst. Urinary bladder is unremarkable for degree of distension. Stomach/Bowel: No enteric contrast was administered. Stomach is unremarkable for degree of distension. No pathologic dilation of small or large bowel. The appendix and terminal ileum appear normal. No evidence of acute bowel inflammation. Vascular/Lymphatic: Normal caliber abdominal aorta. No pathologically enlarged abdominal or pelvic lymph nodes. Reproductive: Uterus and bilateral adnexa are unremarkable. Other: No significant abdominopelvic free fluid. Musculoskeletal: Mild multilevel degenerative changes spine. No acute osseous abnormality. IMPRESSION: 1. Nonobstructive left nephrolithiasis measuring up to 4 mm in the lower pole. No obstructive ureteral or bladder calculi identified. 2. Hepatic steatosis. Electronically Signed   By: Maudry Mayhew M.D.   On: 01/27/2022 12:30    PROCEDURES:  Critical Care performed: None.  Procedures    MEDICATIONS ORDERED IN ED: Medications  potassium chloride SA (KLOR-CON M) CR tablet 40 mEq (40 mEq Oral Given 01/27/22 1208)  HYDROcodone-acetaminophen (NORCO/VICODIN) 5-325 MG per tablet 1 tablet (1 tablet Oral Given 01/27/22 1410)     IMPRESSION / MDM / ASSESSMENT AND PLAN / ED COURSE  I reviewed the triage vital signs and the nursing notes.                              Differential diagnosis includes, but is not limited to, nephrolithiasis, cystitis, pyelonephritis, vaginal trauma, foreign bodies, cervicitis, PCOS, cervical polyps/fibroids.  ED Course Patient appears well.  Vital signs within normal limits.  NAD.  Pregnancy test negative.  CBC unremarkable for leukocytosis or anemia.  CMP notable for mild hypokalemia at 3.1.  We will go ahead and supplement with oral potassium supplement.   No evidence of transaminitis or kidney injury.  Urinalysis notable for large amounts of hemoglobin and trace leukocytes.  Hemoglobin likely due to contamination from vaginal bleeding, though will go ahead and order CT renal stone given her history of nephrolithiasis  Lipase unremarkable at 34.  Unlikely pancreatitis  CT renal stone shows 4 mm renal calculus.  See above for details.  Ultrasound negative.   Assessment/Plan Given the patient's history, physical exam, and work-up thus far, I do not suspect a serious or life-threatening pathology.  CT renal stone shows 4 mm renal calculus, which may be the cause of her abdominal discomfort.  Ultrasound reassuring for no evidence of subchorionic hemorrhage or ovarian pathology.  No evidence of anemia.  No evidence of infection on urinalysis.  We will plan to discharge this patient with a referral to OB/GYN.  Patient does endorse significant intermittent pain at times, will provide very short course of hydrocodone/acetaminophen to be used as needed after ibuprofen has been maximized.  Patient was provided with anticipatory guidance, return precautions, and educational material. Encouraged the patient to return  to the emergency department at any time if they begin to experience any new or worsening symptoms.       FINAL CLINICAL IMPRESSION(S) / ED DIAGNOSES   Final diagnoses:  Nephrolithiasis     Rx / DC Orders   ED Discharge Orders          Ordered    HYDROcodone-acetaminophen (NORCO/VICODIN) 5-325 MG tablet  Every 4 hours PRN        01/27/22 1704             Note:  This document was prepared using Dragon voice recognition software and may include unintentional dictation errors.   Varney Daily, [place] 01/27/22 1705    Arnaldo Natal, MD 01/28/22 [phone]

## 2022-01-27 NOTE — Discharge Instructions (Addendum)
-  Take all of your medications as prescribed. -Follow-up with your urologist, as discussed. -Follow-up with the OB/GYN provider listed above, as discussed

## 2022-01-27 NOTE — ED Triage Notes (Signed)
Pt c/o lower abd pain with vaginal swelling and blood when she wipes for the past 2 weeks.  Pt states she has a hx of urinary reflux.

## 2022-02-02 NOTE — Unmapped (Signed)
[facility] Refill Coordination Note    Specialty Medication(s) to be Shipped:   Inflammatory Disorders: Dupixent    Other medication(s) to be shipped: No additional medications requested for fill at this time     Samantha Miles, DOB: October 21, 1986  Phone: [phone] (home)       All above HIPAA information was verified with patient.     Was a Nurse, learning disability used for this call? No    Completed refill call assessment today to schedule patient's medication shipment from the [facility] [phone]).  All relevant notes have been reviewed.     Specialty medication(s) and dose(s) confirmed: Regimen is correct and unchanged.   Changes to medications: Felesha reports no changes at this time.  Changes to insurance: No  New side effects reported not previously addressed with a pharmacist or physician: None reported  Questions for the pharmacist: No    Confirmed patient received a Conservation officer, historic buildings and a Surveyor, mining with first shipment. The patient will receive a drug information handout for each medication shipped and additional FDA Medication Guides as required.       DISEASE/MEDICATION-SPECIFIC INFORMATION        For patients on injectable medications: Patient currently has 0 doses left.  Next injection is scheduled for 02/15/2022.    SPECIALTY MEDICATION ADHERENCE     Medication Adherence    Patient reported X missed doses in the last month: 0  Specialty Medication: Dupixent  Patient is on additional specialty medications: No  Any gaps in refill history greater than 2 weeks in the last 3 months: no  Demonstrates understanding of importance of adherence: yes  Informant: patient  Reliability of informant: reliable  Confirmed plan for next specialty medication refill: delivery by pharmacy  Refills needed for supportive medications: not needed              Were doses missed due to medication being on hold? No        REFERRAL TO PHARMACIST     Referral to the pharmacist: Not needed      [facility]     Shipping address confirmed in Epic.     Delivery Scheduled: Yes, Expected medication delivery date: 02/06/2022.     Medication will be delivered via Same Day Courier to the prescription address in Epic WAM.    Otto Caraway D Nakiesha Rumsey   [facility] Specialty Technician

## 2022-02-03 ENCOUNTER — Encounter: Payer: Self-pay | Admitting: Podiatry

## 2022-02-03 ENCOUNTER — Other Ambulatory Visit: Payer: Self-pay

## 2022-02-03 ENCOUNTER — Ambulatory Visit (INDEPENDENT_AMBULATORY_CARE_PROVIDER_SITE_OTHER): Payer: Medicare HMO | Admitting: Podiatry

## 2022-02-03 DIAGNOSIS — L081 Erythrasma: Secondary | ICD-10-CM

## 2022-02-03 DIAGNOSIS — L988 Other specified disorders of the skin and subcutaneous tissue: Secondary | ICD-10-CM | POA: Diagnosis not present

## 2022-02-03 MED ORDER — CLOTRIMAZOLE-BETAMETHASONE 1-0.05 % EX CREA: 1.0000 "application " | TOPICAL_CREAM | Freq: Two times a day (BID) | CUTANEOUS | 0 refills | Status: AC

## 2022-02-03 NOTE — Progress Notes (Signed)
r  ?Subjective:  ?Patient ID: Brittney Sanders, female    DOB: Sep 23, 1986,  MRN: [id number] ? ?Chief Complaint  ?Patient presents with  ? Tinea Pedis  ?  "Good, it's not itching as bad now."  ? ? ?36 y.o. female presents with the above complaint.  Patient presents with follow-up of left second and third interdigital space maceration/itching.  She states she is doing a lot better.  The Betadine definitely helped with the itching.  They are in nature as much.  She denies any other acute complaints. ? ? ? ?Review of Systems: Negative except as noted in the HPI. Denies N/V/F/Ch. ? ?Past Medical History:  ?Diagnosis Date  ? Depression   ? Eczema   ? Hearing deficit   ? Heel spur   ? Kidney stone   ? Left nephrolithiasis   ? Nocturia   ? Obesity   ? Overweight   ? Plantar fasciitis   ? Seasonal allergies   ? UTI (lower urinary tract infection)   ? ? ?Current Outpatient Medications:  ?  clotrimazole-betamethasone (LOTRISONE) cream, Apply 1 application. topically 2 (two) times daily., Disp: 30 g, Rfl: 0 ?  DUPIXENT 300 MG/2ML prefilled syringe, , Disp: , Rfl:  ?  Erythromycin 2 % PADS, Apply 1 application topically daily., Disp: 60 each, Rfl: 0 ?  famotidine (PEPCID) 20 MG tablet, Take 1 tablet (20 mg total) by mouth 2 (two) times daily., Disp: 60 tablet, Rfl: 0 ?  fexofenadine (ALLEGRA) 180 MG tablet, Take 180 mg by mouth daily., Disp: , Rfl:  ?  magic mouthwash (lidocaine, diphenhydrAMINE, alum & mag hydroxide) suspension, Swish and swallow 5 mLs 4 (four) times daily as needed for mouth pain., Disp: 360 mL, Rfl: 0 ?  meloxicam (MOBIC) 15 MG tablet, Take 15 mg by mouth daily., Disp: , Rfl:  ?  metoCLOPramide (REGLAN) 10 MG tablet, Take 1 tablet (10 mg total) by mouth every 6 (six) hours as needed., Disp: 30 tablet, Rfl: 0 ?  naproxen (NAPROSYN) 500 MG tablet, Take 1 tablet (500 mg total) by mouth 2 (two) times daily with a meal., Disp: 20 tablet, Rfl: 0 ?  nortriptyline (PAMELOR) 10 MG capsule, Increase nortriptyline to 30 mg  nightly for two weeks, then increase to 40 mg nightly, refilled., Disp: , Rfl:  ?  nystatin (MYCOSTATIN/NYSTOP) powder, , Disp: , Rfl:  ?  ondansetron (ZOFRAN ODT) 4 MG disintegrating tablet, Take 1 tablet (4 mg total) by mouth every 8 (eight) hours as needed for nausea or vomiting., Disp: 15 tablet, Rfl: 0 ?  ondansetron (ZOFRAN) 4 MG tablet, Take 1 tablet (4 mg total) by mouth every 8 (eight) hours as needed for nausea or vomiting., Disp: 8 tablet, Rfl: 0 ?  pantoprazole (PROTONIX) 40 MG tablet, Take 1 tablet (40 mg total) by mouth daily., Disp: 30 tablet, Rfl: 0 ?  PARoxetine (PAXIL) 40 MG tablet, Take 40 mg by mouth 2 (two) times a day., Disp: , Rfl:  ?  potassium chloride SA (KLOR-CON) 20 MEQ tablet, Take 1 tablet (20 mEq total) by mouth daily for 5 days., Disp: 5 tablet, Rfl: 0 ? ?Social History  ? ?Tobacco Use  ?Smoking Status Never  ?Smokeless Tobacco Never  ? ? ?Allergies  ?Allergen Reactions  ? Sulfa Antibiotics Hives and Rash  ? Sulfamethoxazole Rash  ? ?Objective:  ?There were no vitals filed for this visit. ?There is no height or weight on file to calculate BMI. ?Constitutional Well developed. ?Well nourished.  ?Vascular Dorsalis  pedis pulses palpable bilaterally. ?Posterior tibial pulses palpable bilaterally. ?Capillary refill normal to all digits.  ?No cyanosis or clubbing noted. ?Pedal Vallely growth normal.  ?Neurologic Normal speech. ?Oriented to person, place, and time. ?Epicritic sensation to light touch grossly present bilaterally.  ?Dermatologic much improved Much improved epidermal lysis  without component of itching noted to interdigital space with worsening left second and third interdigital space.  No plantar athlete's foot noted.  Mild maceration noted in between the toes  ?Orthopedic: Normal joint ROM without pain or crepitus bilaterally. ?No visible deformities. ?No bony tenderness.  ? ?Radiographs: None ?Assessment:  ? ?1. Maceration of skin   ?2. Erythrasma   ? ? ? ?Plan:  ?Patient was  evaluated and treated and all questions answered. ? ?Left second and third interdigital space erythrasma versus tinea pedis ?-I explained to the patient the etiology of interspace erythrasma versus treatment options were discussed.   ?-Clinically it is improving with Betadine wet-to-dry and erythromycin pads.  At this time I will transition her to Lotrisone cream.  She has had much better improvement.  I discussed shoe gear modification and keeping it very dry between the toes as well as the bottom of the foot.  She states understanding. ? ?No follow-ups on file. ? ?

## 2022-02-11 ENCOUNTER — Encounter: Payer: Self-pay | Admitting: Gastroenterology

## 2022-02-11 NOTE — H&P (Signed)
? ?Pre-Procedure H&P ?  ?Patient ID: Brittney Sanders is a 36 y.o. female. ? ?Gastroenterology Provider: Jaynie Collins, DO ? ?Referring Provider: Tawni Pummel, PA ?PCP: System, Provider Not In ? ?Date: 02/12/2022 ? ?HPI ?Ms. Brittney Sanders is a 36 y.o. female who presents today for Esophagogastroduodenoscopy for Dysphagia, throat pain. ?Patient with a longstanding history of eczema currently on Dupixent who was initially seen by ENT for throat pain, dysphagia and odynophagia.  They noted erythema and irritation on laryngoscope I recommended GI consult.  She was also given daily Protonix 40 mg and Magic mouthwash with only some improvement. ?Dysphagia occurs with solid foods not liquids.  No family history of inflammatory bowel disease or autoimmune disease. ?Elevated transaminases ast/alt int eh 40s.  Hemoglobin 15.6 creatinine 0.22 MCV 87 platelets 261,000.  The patient has intentionally lost 40 pounds in the last 15 months. ?She has had a normal right upper quadrant ultrasound.  CT demonstrating fatty liver disease ?No other acute GI complaints ? ?Past Medical History:  ?Diagnosis Date  ? Depression   ? Eczema   ? Hearing deficit   ? Heel spur   ? Hypertension   ? Kidney stone   ? Left nephrolithiasis   ? Nocturia   ? Obesity   ? Overweight   ? Plantar fasciitis   ? Seasonal allergies   ? UTI (lower urinary tract infection)   ? ? ?Past Surgical History:  ?Procedure Laterality Date  ? TUBAL LIGATION    ? TYMPANOSTOMY TUBE PLACEMENT Right   ? ? ?Family History ?No h/o GI disease or malignancy ? ?Review of Systems  ?Constitutional:  Negative for activity change, appetite change, chills, diaphoresis, fatigue, fever and unexpected weight change.  ?HENT:  Positive for sore throat and trouble swallowing. Negative for voice change.   ?Respiratory:  Negative for shortness of breath and wheezing.   ?Cardiovascular:  Negative for chest pain, palpitations and leg swelling.  ?Gastrointestinal:  Negative for abdominal  distention, abdominal pain, anal bleeding, blood in stool, constipation, diarrhea, nausea, rectal pain and vomiting.  ?Musculoskeletal:  Negative for arthralgias and myalgias.  ?Skin:  Negative for color change and pallor.  ?Neurological:  Negative for dizziness, syncope and weakness.  ?Psychiatric/Behavioral:  Negative for confusion.   ?All other systems reviewed and are negative.  ? ?Medications ?No current facility-administered medications on file prior to encounter.  ? ?Current Outpatient Medications on File Prior to Encounter  ?Medication Sig Dispense Refill  ? DUPIXENT 300 MG/2ML prefilled syringe     ? hydrochlorothiazide (MICROZIDE) 12.5 MG capsule Take 12.5 mg by mouth daily.    ? liraglutide (VICTOZA) 18 MG/3ML SOPN Inject 0.6 mg into the skin daily.    ? nystatin (MYCOSTATIN/NYSTOP) powder     ? Erythromycin 2 % PADS Apply 1 application topically daily. 60 each 0  ? famotidine (PEPCID) 20 MG tablet Take 1 tablet (20 mg total) by mouth 2 (two) times daily. 60 tablet 0  ? fexofenadine (ALLEGRA) 180 MG tablet Take 180 mg by mouth daily.    ? magic mouthwash (lidocaine, diphenhydrAMINE, alum & mag hydroxide) suspension Swish and swallow 5 mLs 4 (four) times daily as needed for mouth pain. 360 mL 0  ? meloxicam (MOBIC) 15 MG tablet Take 15 mg by mouth daily. (Patient not taking: Reported on 02/12/2022)    ? metoCLOPramide (REGLAN) 10 MG tablet Take 1 tablet (10 mg total) by mouth every 6 (six) hours as needed. 30 tablet 0  ? naproxen (NAPROSYN)  500 MG tablet Take 1 tablet (500 mg total) by mouth 2 (two) times daily with a meal. (Patient not taking: Reported on 02/12/2022) 20 tablet 0  ? nortriptyline (PAMELOR) 10 MG capsule Increase nortriptyline to 30 mg nightly for two weeks, then increase to 40 mg nightly, refilled. (Patient not taking: Reported on 02/12/2022)    ? ondansetron (ZOFRAN ODT) 4 MG disintegrating tablet Take 1 tablet (4 mg total) by mouth every 8 (eight) hours as needed for nausea or vomiting. 15  tablet 0  ? ondansetron (ZOFRAN) 4 MG tablet Take 1 tablet (4 mg total) by mouth every 8 (eight) hours as needed for nausea or vomiting. 8 tablet 0  ? pantoprazole (PROTONIX) 40 MG tablet Take 1 tablet (40 mg total) by mouth daily. 30 tablet 0  ? PARoxetine (PAXIL) 40 MG tablet Take 40 mg by mouth 2 (two) times a day. (Patient not taking: Reported on 02/12/2022)    ? potassium chloride SA (KLOR-CON) 20 MEQ tablet Take 1 tablet (20 mEq total) by mouth daily for 5 days. 5 tablet 0  ? ? ?Pertinent medications related to GI and procedure were reviewed by me with the patient prior to the procedure ? ? ?Current Facility-Administered Medications:  ?  0.9 %  sodium chloride infusion, , Intravenous, Continuous, Jaynie Collins, DO, Last Rate: 20 mL/hr at 02/12/22 7017, Continued from Pre-op at 02/12/22 0906 ?  ?  ? ?Allergies  ?Allergen Reactions  ? Sulfa Antibiotics Hives and Rash  ? Sulfamethoxazole Rash  ? ?Allergies were reviewed by me prior to the procedure ? ?Objective  ? ? ?Vitals:  ? 02/12/22 0817  ?BP: (!) 144/98  ?Pulse: 98  ?Resp: 18  ?Temp: (!) 96.2 ?F (35.7 ?C)  ?TempSrc: Temporal  ?SpO2: 98%  ?Weight: 117.9 kg  ?Height: 5\' 3"  (1.6 m)  ? ? ? ?Physical Exam ?Vitals and nursing note reviewed.  ?Constitutional:   ?   General: She is not in acute distress. ?   Appearance: Normal appearance. She is obese. She is not ill-appearing, toxic-appearing or diaphoretic.  ?HENT:  ?   Head: Normocephalic and atraumatic.  ?   Ears:  ?   Comments: Hearing aids bilaterally ?   Nose: Nose normal.  ?   Mouth/Throat:  ?   Mouth: Mucous membranes are moist.  ?   Pharynx: Oropharynx is clear.  ?Eyes:  ?   General: No scleral icterus. ?   Extraocular Movements: Extraocular movements intact.  ?Cardiovascular:  ?   Rate and Rhythm: Normal rate and regular rhythm.  ?   Heart sounds: Normal heart sounds. No murmur heard. ?  No friction rub. No gallop.  ?Pulmonary:  ?   Effort: Pulmonary effort is normal. No respiratory distress.  ?    Breath sounds: Normal breath sounds. No wheezing, rhonchi or rales.  ?Abdominal:  ?   General: Bowel sounds are normal. There is no distension.  ?   Palpations: Abdomen is soft.  ?   Tenderness: There is no abdominal tenderness. There is no guarding or rebound.  ?Musculoskeletal:  ?   Cervical back: Neck supple.  ?   Right lower leg: No edema.  ?   Left lower leg: No edema.  ?Skin: ?   General: Skin is warm and dry.  ?   Coloration: Skin is not jaundiced or pale.  ?Neurological:  ?   Mental Status: She is alert and oriented to person, place, and time. Mental status is at baseline.  ?Psychiatric:     ?  Mood and Affect: Mood normal.     ?   Behavior: Behavior normal.     ?   Thought Content: Thought content normal.     ?   Judgment: Judgment normal.  ? ? ? ?Assessment:  ?Ms. Victora M Salvi is a 36 y.o. female  who presents today for Esophagogastroduodenoscopy for Dysphagia, throat pain. ? ?Plan:  ?Esophagogastroduodenoscopy with possible intervention today ? ?Esophagogastroduodenoscopy with possible biopsy, control of bleeding, polypectomy, and interventions as necessary has been discussed with the patient/patient representative. Informed consent was obtained from the patient/patient representative after explaining the indication, nature, and risks of the procedure including but not limited to death, bleeding, perforation, missed neoplasm/lesions, cardiorespiratory compromise, and reaction to medications. Opportunity for questions was given and appropriate answers were provided. Patient/patient representative has verbalized understanding is amenable to undergoing the procedure. ? ? ?Jaynie CollinsSteven Michael Keyetta Hollingworth, DO  ?[facility] ? ?Portions of the record may have been created with voice recognition software. Occasional wrong-word or 'sound-a-like' substitutions may have occurred due to the inherent limitations of voice recognition software.  Read the chart carefully and recognize, using context, where  substitutions may have occurred. ?

## 2022-02-12 ENCOUNTER — Ambulatory Visit: Payer: Medicare HMO | Admitting: Anesthesiology

## 2022-02-12 ENCOUNTER — Ambulatory Visit
Admission: RE | Admit: 2022-02-12 | Discharge: 2022-02-12 | Disposition: A | Discharge status: Home / Self Care | Payer: Medicare HMO | Attending: Gastroenterology | Admitting: Gastroenterology

## 2022-02-12 ENCOUNTER — Encounter: Payer: Self-pay | Admitting: Gastroenterology

## 2022-02-12 ENCOUNTER — Encounter
Admission: RE | Disposition: A | Discharge status: Home / Self Care | Payer: Self-pay | Source: Home / Self Care | Attending: Gastroenterology

## 2022-02-12 DIAGNOSIS — K2970 Gastritis, unspecified, without bleeding: Secondary | ICD-10-CM | POA: Insufficient documentation

## 2022-02-12 DIAGNOSIS — K760 Fatty (change of) liver, not elsewhere classified: Secondary | ICD-10-CM | POA: Insufficient documentation

## 2022-02-12 DIAGNOSIS — Z79899 Other long term (current) drug therapy: Secondary | ICD-10-CM | POA: Diagnosis not present

## 2022-02-12 DIAGNOSIS — L309 Dermatitis, unspecified: Secondary | ICD-10-CM | POA: Diagnosis not present

## 2022-02-12 DIAGNOSIS — Z6841 Body Mass Index (BMI) 40.0 and over, adult: Secondary | ICD-10-CM | POA: Insufficient documentation

## 2022-02-12 DIAGNOSIS — I10 Essential (primary) hypertension: Secondary | ICD-10-CM | POA: Diagnosis not present

## 2022-02-12 DIAGNOSIS — F32A Depression, unspecified: Secondary | ICD-10-CM | POA: Insufficient documentation

## 2022-02-12 DIAGNOSIS — Z7985 Long-term (current) use of injectable non-insulin antidiabetic drugs: Secondary | ICD-10-CM | POA: Diagnosis not present

## 2022-02-12 DIAGNOSIS — Z791 Long term (current) use of non-steroidal anti-inflammatories (NSAID): Secondary | ICD-10-CM | POA: Diagnosis not present

## 2022-02-12 DIAGNOSIS — K319 Disease of stomach and duodenum, unspecified: Secondary | ICD-10-CM | POA: Diagnosis not present

## 2022-02-12 DIAGNOSIS — R1310 Dysphagia, unspecified: Secondary | ICD-10-CM | POA: Insufficient documentation

## 2022-02-12 HISTORY — DX: Essential (primary) hypertension: I10

## 2022-02-12 HISTORY — PX: ESOPHAGOGASTRODUODENOSCOPY (EGD) WITH PROPOFOL: SHX5813

## 2022-02-12 LAB — POCT PREGNANCY, URINE: Preg Test, Ur: NEGATIVE

## 2022-02-12 SURGERY — ESOPHAGOGASTRODUODENOSCOPY (EGD) WITH PROPOFOL
Anesthesia: General

## 2022-02-12 MED ORDER — SODIUM CHLORIDE 0.9 % IV SOLN: INTRAVENOUS | Status: DC

## 2022-02-12 MED ORDER — PROPOFOL 500 MG/50ML IV EMUL
INTRAVENOUS | Status: DC | PRN
  Administered 2022-02-12: 150 ug/kg/min via INTRAVENOUS

## 2022-02-12 MED ORDER — LIDOCAINE HCL (PF) 2 % IJ SOLN
INTRAMUSCULAR | Status: AC
  Filled 2022-02-12: qty 5

## 2022-02-12 MED ORDER — PROPOFOL 500 MG/50ML IV EMUL
INTRAVENOUS | Status: AC
  Filled 2022-02-12: qty 50

## 2022-02-12 NOTE — Transfer of Care (Signed)
Immediate Anesthesia Transfer of Care Note ? ?Patient: Brittney Sanders ? ?Procedure(s) Performed: ESOPHAGOGASTRODUODENOSCOPY (EGD) WITH PROPOFOL ? ?Patient Location: PACU ? ?Anesthesia Type:General ? ?Level of Consciousness: awake and sedated ? ?Airway & Oxygen Therapy: Patient Spontanous Breathing and Patient connected to face mask oxygen ? ?Post-op Assessment: Report given to RN and Post -op Vital signs reviewed and stable ? ?Post vital signs: Reviewed and stable ? ?Last Vitals:  ?Vitals Value Taken Time  ?BP    ?Temp    ?Pulse    ?Resp    ?SpO2    ? ? ?Last Pain:  ?Vitals:  ? 02/12/22 0817  ?TempSrc: Temporal  ?PainSc: 0-No pain  ?   ? ?  ? ?Complications: No notable events documented. ?

## 2022-02-12 NOTE — Anesthesia Postprocedure Evaluation (Signed)
Anesthesia Post Note ? ?Patient: Raul Broadus Sircy ? ?Procedure(s) Performed: ESOPHAGOGASTRODUODENOSCOPY (EGD) WITH PROPOFOL ? ?Patient location during evaluation: PACU ?Anesthesia Type: General ?Level of consciousness: awake and alert ?Pain management: pain level controlled ?Vital Signs Assessment: post-procedure vital signs reviewed and stable ?Respiratory status: spontaneous breathing, nonlabored ventilation, respiratory function stable and patient connected to nasal cannula oxygen ?Cardiovascular status: blood pressure returned to baseline and stable ?Postop Assessment: no apparent nausea or vomiting ?Anesthetic complications: no ? ? ?No notable events documented. ? ? ?Last Vitals:  ?Vitals:  ? 02/12/22 0937 02/12/22 0947  ?BP: (!) 145/89 (!) 141/97  ?Pulse:    ?Resp:    ?Temp:    ?SpO2:    ?  ?Last Pain:  ?Vitals:  ? 02/12/22 0947  ?TempSrc:   ?PainSc: 0-No pain  ? ? ?  ?  ?  ?  ?  ?  ? ?Molli Barrows ? ? ? ? ?

## 2022-02-12 NOTE — Anesthesia Preprocedure Evaluation (Signed)
Anesthesia Evaluation  ?Patient identified by MRN, date of birth, ID band ?Patient awake ? ? ? ?Reviewed: ?Allergy & Precautions, H&P , NPO status , Patient's Chart, lab work & pertinent test results, reviewed documented beta blocker date and time  ? ?Airway ?Mallampati: II ? ? ?Neck ROM: full ? ? ? Dental ? ?(+) Poor Dentition ?  ?Pulmonary ?neg pulmonary ROS,  ?  ?Pulmonary exam normal ? ? ? ? ? ? ? Cardiovascular ?Exercise Tolerance: Good ?hypertension, On Medications ?negative cardio ROS ?Normal cardiovascular exam ?Rhythm:regular Rate:Normal ? ? ?  ?Neuro/Psych ?PSYCHIATRIC DISORDERS Depression negative neurological ROS ?   ? GI/Hepatic ?negative GI ROS, Neg liver ROS,   ?Endo/Other  ?Morbid obesity ? Renal/GU ?Renal disease  ?negative genitourinary ?  ?Musculoskeletal ? ? Abdominal ?  ?Peds ? Hematology ?negative hematology ROS ?(+)   ?Anesthesia Other Findings ?Past Medical History: ?No date: Depression ?No date: Eczema ?No date: Hearing deficit ?No date: Heel spur ?No date: Hypertension ?No date: Kidney stone ?No date: Left nephrolithiasis ?No date: Nocturia ?No date: Obesity ?No date: Overweight ?No date: Plantar fasciitis ?No date: Seasonal allergies ?No date: UTI (lower urinary tract infection) ?Past Surgical History: ?No date: TUBAL LIGATION ?No date: TYMPANOSTOMY TUBE PLACEMENT; Right ?BMI   ? Body Mass Index: 46.06 kg/m?  ?  ? Reproductive/Obstetrics ?negative OB ROS ? ?  ? ? ? ? ? ? ? ? ? ? ? ? ? ?  ?  ? ? ? ? ? ? ? ? ?Anesthesia Physical ?Anesthesia Plan ? ?ASA: 3 ? ?Anesthesia Plan: General  ? ?Post-op Pain Management:   ? ?Induction:  ? ?PONV Risk Score and Plan:  ? ?Airway Management Planned:  ? ?Additional Equipment:  ? ?Intra-op Plan:  ? ?Post-operative Plan:  ? ?Informed Consent: I have reviewed the patients History and Physical, chart, labs and discussed the procedure including the risks, benefits and alternatives for the proposed anesthesia with the patient  or authorized representative who has indicated his/her understanding and acceptance.  ? ? ? ?Dental Advisory Given ? ?Plan Discussed with: CRNA ? ?Anesthesia Plan Comments:   ? ? ? ? ? ? ?Anesthesia Quick Evaluation ? ?

## 2022-02-12 NOTE — Interval H&P Note (Signed)
History and Physical Interval Note: Preprocedure H&P from 02/12/22 ? was reviewed and there was no interval change after seeing and examining the patient.  Written consent was obtained from the patient after discussion of risks, benefits, and alternatives. Patient has consented to proceed with Esophagogastroduodenoscopy with possible intervention ? ? ?02/12/2022 ?9:08 AM ? ?Brittney Sanders  has presented today for surgery, with the diagnosis of Odynophagia.  The various methods of treatment have been discussed with the patient and family. After consideration of risks, benefits and other options for treatment, the patient has consented to  Procedure(s): ?ESOPHAGOGASTRODUODENOSCOPY (EGD) WITH PROPOFOL (N/A) as a surgical intervention.  The patient's history has been reviewed, patient examined, no change in status, stable for surgery.  I have reviewed the patient's chart and labs.  Questions were answered to the patient's satisfaction.   ? ? ?Jaynie Collins ? ? ?

## 2022-02-12 NOTE — Anesthesia Procedure Notes (Signed)
Date/Time: 02/12/2022 9:15 AM ?Performed by: Tonia Ghent ?Pre-anesthesia Checklist: Patient identified, Emergency Drugs available, Suction available, Patient being monitored and Timeout performed ?Patient Re-evaluated:Patient Re-evaluated prior to induction ?Oxygen Delivery Method: Supernova nasal CPAP ?Preoxygenation: Pre-oxygenation with 100% oxygen ?Induction Type: IV induction ?Airway Equipment and Method: Bite block ?Placement Confirmation: CO2 detector and positive ETCO2 ? ? ? ? ?

## 2022-02-12 NOTE — Op Note (Signed)
[facility] ?Gastroenterology ?Patient Name: Brittney Sanders ?Procedure Date: 02/12/2022 9:02 AM ?MRN: [id number] ?Account #: [id number] ?Date of Birth: 1986/03/05 ?Admit Type: Outpatient ?Age: 36 ?Room: [facility] ENDO ROOM 1 ?Gender: Female ?Note Status: Finalized ?Instrument Name: Upper Endoscope [id number] ?Procedure:             Upper GI endoscopy ?Indications:           Dysphagia, Odynophagia ?Providers:             Annamaria Helling DO, DO ?Referring MD:          Cambell j. [facility], dr (Referring MD) ?Medicines:             Monitored Anesthesia Care ?Complications:         No immediate complications. Estimated blood loss:  ?                       Minimal. ?Procedure:             Pre-Anesthesia Assessment: ?                       - Prior to the procedure, a History and Physical was  ?                       performed, and patient medications and allergies were  ?                       reviewed. The patient is competent. The risks and  ?                       benefits of the procedure and the sedation options and  ?                       risks were discussed with the patient. All questions  ?                       were answered and informed consent was obtained.  ?                       Patient identification and proposed procedure were  ?                       verified by the physician, the nurse, the anesthetist  ?                       and the technician in the endoscopy suite. Mental  ?                       Status Examination: alert and oriented. Airway  ?                       Examination: normal oropharyngeal airway and neck  ?                       mobility. Respiratory Examination: clear to  ?                       auscultation. CV Examination: RRR, no murmurs, no S3  ?  or S4. Prophylactic Antibiotics: The patient does not  ?                       require prophylactic antibiotics. Prior  ?                       Anticoagulants: The patient has taken no previous   ?                       anticoagulant or antiplatelet agents. ASA Grade  ?                       Assessment: III - A patient with severe systemic  ?                       disease. After reviewing the risks and benefits, the  ?                       patient was deemed in satisfactory condition to  ?                       undergo the procedure. The anesthesia plan was to use  ?                       monitored anesthesia care (MAC). Immediately prior to  ?                       administration of medications, the patient was  ?                       re-assessed for adequacy to receive sedatives. The  ?                       heart rate, respiratory rate, oxygen saturations,  ?                       blood pressure, adequacy of pulmonary ventilation, and  ?                       response to care were monitored throughout the  ?                       procedure. The physical status of the patient was  ?                       re-assessed after the procedure. ?                       After obtaining informed consent, the endoscope was  ?                       passed under direct vision. Throughout the procedure,  ?                       the patient's blood pressure, pulse, and oxygen  ?                       saturations were monitored continuously. The Endoscope  ?  was introduced through the mouth, and advanced to the  ?                       second part of duodenum. The upper GI endoscopy was  ?                       accomplished without difficulty. The patient tolerated  ?                       the procedure well. ?Findings: ?     The duodenal bulb, first portion of the duodenum and second portion of  ?     the duodenum were normal. Estimated blood loss: none. ?     Localized mild inflammation characterized by erythema was found in the  ?     gastric antrum. Biopsies were taken with a cold forceps for Helicobacter  ?     pylori testing. Estimated blood loss was minimal. ?     The exam of the stomach was  otherwise normal. ?     The Z-line was regular. ?     Esophagogastric landmarks were identified: the gastroesophageal junction  ?     was found at 35 cm from the incisors. ?     Normal mucosa was found in the entire esophagus. The scope was  ?     withdrawn. Dilation was performed with a Maloney dilator with no  ?     resistance at 48 Fr and 52 Fr. The dilation site was examined following  ?     endoscope reinsertion and showed no change. Estimated blood loss: none. ?     The exam was otherwise without abnormality. ?Impression:            - Normal duodenal bulb, first portion of the duodenum  ?                       and second portion of the duodenum. ?                       - Gastritis. Biopsied. ?                       - Z-line regular. ?                       - Esophagogastric landmarks identified. ?                       - Normal mucosa was found in the entire esophagus.  ?                       Dilated. ?                       - The examination was otherwise normal. ?Recommendation:        - Discharge patient to home. ?                       - Resume previous diet. ?                       - Continue present medications. ?                       -  No aspirin, ibuprofen, naproxen, or other  ?                       non-steroidal anti-inflammatory drugs. ?                       - Await pathology results. ?                       - Return to GI clinic as previously scheduled. ?                       - The findings and recommendations were discussed with  ?                       the patient. ?Procedure Code(s):     --- Professional --- ?                       [phone], Esophagogastroduodenoscopy, flexible,  ?                       transoral; with biopsy, single or multiple ?                       43450, Dilation of esophagus, by unguided sound or  ?                       bougie, single or multiple passes ?Diagnosis Code(s):     --- Professional --- ?                       K29.70, Gastritis, unspecified, without bleeding ?                        R13.10, Dysphagia, unspecified ?CPT copyright 2019 [employer] Association. All rights reserved. ?The codes documented in this report are preliminary and upon coder review may  ?be revised to meet current compliance requirements. ?Attending Participation: ?     I personally performed the entire procedure. ?Volney American, DO ?Annamaria Helling DO, DO ?02/12/2022 9:29:07 AM ?This report has been signed electronically. ?Number of Addenda: 0 ?Note Initiated On: 02/12/2022 9:02 AM ?Estimated Blood Loss:  Estimated blood loss was minimal. ?     [facility] ?

## 2022-02-13 ENCOUNTER — Encounter: Payer: Self-pay | Admitting: Gastroenterology

## 2022-02-13 LAB — SURGICAL PATHOLOGY

## 2022-02-16 ENCOUNTER — Encounter: Payer: Self-pay | Admitting: Emergency Medicine

## 2022-02-16 ENCOUNTER — Other Ambulatory Visit: Payer: Self-pay

## 2022-02-16 ENCOUNTER — Emergency Department
Admission: EM | Admit: 2022-02-16 | Discharge: 2022-02-16 | Disposition: A | Discharge status: Home / Self Care | Payer: Medicare HMO | Attending: Emergency Medicine | Admitting: Emergency Medicine

## 2022-02-16 ENCOUNTER — Emergency Department: Payer: Medicare HMO

## 2022-02-16 DIAGNOSIS — R112 Nausea with vomiting, unspecified: Secondary | ICD-10-CM | POA: Diagnosis not present

## 2022-02-16 DIAGNOSIS — M545 Low back pain, unspecified: Secondary | ICD-10-CM | POA: Diagnosis not present

## 2022-02-16 DIAGNOSIS — D72829 Elevated white blood cell count, unspecified: Secondary | ICD-10-CM | POA: Diagnosis not present

## 2022-02-16 DIAGNOSIS — M549 Dorsalgia, unspecified: Secondary | ICD-10-CM | POA: Diagnosis present

## 2022-02-16 DIAGNOSIS — R109 Unspecified abdominal pain: Secondary | ICD-10-CM | POA: Diagnosis not present

## 2022-02-16 LAB — CBC
HCT: 47.1 % — ABNORMAL HIGH (ref 36.0–46.0)
Hemoglobin: 15.6 g/dL — ABNORMAL HIGH (ref 12.0–15.0)
MCH: 28.9 pg (ref 26.0–34.0)
MCHC: 33.1 g/dL (ref 30.0–36.0)
MCV: 87.2 fL (ref 80.0–100.0)
Platelets: 358 10*3/uL (ref 150–400)
RBC: 5.4 MIL/uL — ABNORMAL HIGH (ref 3.87–5.11)
RDW: 13 % (ref 11.5–15.5)
WBC: 11.2 10*3/uL — ABNORMAL HIGH (ref 4.0–10.5)
nRBC: 0 % (ref 0.0–0.2)

## 2022-02-16 LAB — COMPREHENSIVE METABOLIC PANEL
ALT: 40 U/L (ref 0–44)
AST: 39 U/L (ref 15–41)
Albumin: 4.4 g/dL (ref 3.5–5.0)
Alkaline Phosphatase: 64 U/L (ref 38–126)
Anion gap: 9 (ref 5–15)
BUN: 14 mg/dL (ref 6–20)
CO2: 28 mmol/L (ref 22–32)
Calcium: 9 mg/dL (ref 8.9–10.3)
Chloride: 100 mmol/L (ref 98–111)
Creatinine, Ser: 0.85 mg/dL (ref 0.44–1.00)
GFR, Estimated: >60 mL/min (ref >60)
Glucose, Bld: 122 mg/dL — ABNORMAL HIGH (ref 70–99)
Potassium: 3.3 mmol/L — ABNORMAL LOW (ref 3.5–5.1)
Sodium: 137 mmol/L (ref 135–145)
Total Bilirubin: 1.2 mg/dL (ref 0.3–1.2)
Total Protein: 7.6 g/dL (ref 6.5–8.1)

## 2022-02-16 LAB — URINALYSIS, ROUTINE W REFLEX MICROSCOPIC
Bacteria, UA: NONE SEEN
Bilirubin Urine: NEGATIVE
Glucose, UA: NEGATIVE mg/dL
Hgb urine dipstick: NEGATIVE
Ketones, ur: NEGATIVE mg/dL
Nitrite: NEGATIVE
Protein, ur: 30 mg/dL — AB
Specific Gravity, Urine: 1.03 (ref 1.005–1.030)
pH: 6 (ref 5.0–8.0)

## 2022-02-16 LAB — LIPASE, BLOOD: Lipase: 35 U/L (ref 11–51)

## 2022-02-16 MED ORDER — KETOROLAC TROMETHAMINE 30 MG/ML IJ SOLN
30.0000 mg | Freq: Once | INTRAMUSCULAR | Status: AC
  Administered 2022-02-16: 30 mg via INTRAMUSCULAR
  Filled 2022-02-16: qty 1

## 2022-02-16 MED ORDER — NAPROXEN 500 MG PO TABS: 500.0000 mg | ORAL_TABLET | Freq: Two times a day (BID) | ORAL | 2 refills | Status: DC

## 2022-02-16 NOTE — ED Provider Notes (Signed)
? ?[facility] ?Provider Note ? ? ? Event Date/Time  ? First MD Initiated Contact with Patient 02/16/22 [phone]   ?  (approximate) ? ? ?History  ? ?Back Pain and Nausea ? ? ?HPI ? ?Brittney Sanders is a 36 y.o. female with a history of kidney stones who presents with complaints of left-sided back and flank pain with nausea and vomiting.  She reports that started approximately 1:30 in the morning.  She reports the pain is sharp and radiates around to her left side.  No dysuria, no hematuria noted.  Has not take anything for this. ?  ? ? ?Physical Exam  ? ?Triage Vital Signs: ?ED Triage Vitals  ?Enc Vitals Group  ?   BP 02/16/22 0929 (!) 124/92  ?   Pulse Rate 02/16/22 0929 (!) 102  ?   Resp 02/16/22 0929 20  ?   Temp 02/16/22 0929 98.3 ?F (36.8 ?C)  ?   Temp Source 02/16/22 0929 Oral  ?   SpO2 02/16/22 0929 100 %  ?   Weight 02/16/22 0930 117.9 kg (260 lb)  ?   Height 02/16/22 0930 1.6 m (5\' 3" )  ?   Head Circumference --   ?   Peak Flow --   ?   Pain Score 02/16/22 0929 9  ?   Pain Loc --   ?   Pain Edu? --   ?   Excl. in GC? --   ? ? ?Most recent vital signs: ?Vitals:  ? 02/16/22 0929  ?BP: (!) 124/92  ?Pulse: (!) 102  ?Resp: 20  ?Temp: 98.3 ?F (36.8 ?C)  ?SpO2: 100%  ? ? ? ?General: Awake, no distress.  ?CV:  Good peripheral perfusion.  ?Resp:  Normal effort.  ?Abd:  No distention.  No significant left lower quadrant tenderness palpation.  Mild left CVA tenderness ?Other:   ? ? ?ED Results / Procedures / Treatments  ? ?Labs ?(all labs ordered are listed, but only abnormal results are displayed) ?Labs Reviewed  ?COMPREHENSIVE METABOLIC PANEL - Abnormal; Notable for the following components:  ?    Result Value  ? Potassium 3.3 (*)   ? Glucose, Bld 122 (*)   ? All other components within normal limits  ?CBC - Abnormal; Notable for the following components:  ? WBC 11.2 (*)   ? RBC 5.40 (*)   ? Hemoglobin 15.6 (*)   ? HCT 47.1 (*)   ? All other components within normal limits  ?URINALYSIS, ROUTINE W  REFLEX MICROSCOPIC - Abnormal; Notable for the following components:  ? Color, Urine YELLOW (*)   ? APPearance HAZY (*)   ? Protein, ur 30 (*)   ? Leukocytes,Ua TRACE (*)   ? All other components within normal limits  ?LIPASE, BLOOD  ?PREGNANCY, URINE  ? ? ? ?EKG ? ? ? ? ?RADIOLOGY ?CT renal stone study viewed by me, no acute abnormality, ? ? ? ?PROCEDURES: ? ?Critical Care performed:  ? ?Procedures ? ? ?MEDICATIONS ORDERED IN ED: ?Medications  ?ketorolac (TORADOL) 30 MG/ML injection 30 mg (30 mg Intramuscular Given 02/16/22 1030)  ? ? ? ?IMPRESSION / MDM / ASSESSMENT AND PLAN / ED COURSE  ?I reviewed the triage vital signs and the nursing notes. ? ? ? ?Patient presents with back pain as detailed above.  Differential includes musculoskeletal back pain, ureterolithiasis, UTI ? ?Pending labs, CT renal stone study ? ?Urinalysis without evidence of UTI ? ?Lab work is overall reassuring, CMP is unremarkable, mild  elevation of white blood cell count is nonspecific. ? ?CT renal stone study is reassuring, patient is improved after IM Toradol. ? ?SPECT musculoskeletal back pain is the cause, will treat with NSAIDs, outpatient follow-up recommended, return precautions discussed. ? ? ? ?  ? ? ?FINAL CLINICAL IMPRESSION(S) / ED DIAGNOSES  ? ?Final diagnoses:  ?Acute left-sided low back pain without sciatica  ? ? ? ?Rx / DC Orders  ? ?ED Discharge Orders   ? ?      Ordered  ?  naproxen (NAPROSYN) 500 MG tablet  2 times daily with meals       ? 02/16/22 1200  ? ?  ?  ? ?  ? ? ? ?Note:  This document was prepared using Dragon voice recognition software and may include unintentional dictation errors. ?  ?Jene Every, MD ?02/16/22 1204 ? ?

## 2022-02-16 NOTE — ED Triage Notes (Signed)
Pt to ED via POV with right flank with nausea that began at 0130 this am, she took zofran today it did not help. She also reports that she tried to use the restroom but no urine came out.  ?

## 2022-02-23 ENCOUNTER — Other Ambulatory Visit: Payer: Self-pay

## 2022-02-23 ENCOUNTER — Emergency Department: Payer: Medicare HMO

## 2022-02-23 DIAGNOSIS — K2900 Acute gastritis without bleeding: Secondary | ICD-10-CM | POA: Diagnosis not present

## 2022-02-23 DIAGNOSIS — I10 Essential (primary) hypertension: Secondary | ICD-10-CM | POA: Insufficient documentation

## 2022-02-23 DIAGNOSIS — Z79899 Other long term (current) drug therapy: Secondary | ICD-10-CM | POA: Insufficient documentation

## 2022-02-23 DIAGNOSIS — R0789 Other chest pain: Secondary | ICD-10-CM | POA: Insufficient documentation

## 2022-02-23 LAB — LIPASE, BLOOD: Lipase: 28 U/L (ref 11–51)

## 2022-02-23 LAB — CBC
HCT: 45.4 % (ref 36.0–46.0)
Hemoglobin: 15.4 g/dL — ABNORMAL HIGH (ref 12.0–15.0)
MCH: 29.4 pg (ref 26.0–34.0)
MCHC: 33.9 g/dL (ref 30.0–36.0)
MCV: 86.8 fL (ref 80.0–100.0)
Platelets: 408 10*3/uL — ABNORMAL HIGH (ref 150–400)
RBC: 5.23 MIL/uL — ABNORMAL HIGH (ref 3.87–5.11)
RDW: 12.9 % (ref 11.5–15.5)
WBC: 17.9 10*3/uL — ABNORMAL HIGH (ref 4.0–10.5)
nRBC: 0 % (ref 0.0–0.2)

## 2022-02-23 LAB — BASIC METABOLIC PANEL
Anion gap: 11 (ref 5–15)
BUN: 11 mg/dL (ref 6–20)
CO2: 29 mmol/L (ref 22–32)
Calcium: 9.4 mg/dL (ref 8.9–10.3)
Chloride: 99 mmol/L (ref 98–111)
Creatinine, Ser: 0.77 mg/dL (ref 0.44–1.00)
GFR, Estimated: >60 mL/min (ref >60)
Glucose, Bld: 107 mg/dL — ABNORMAL HIGH (ref 70–99)
Potassium: 3.2 mmol/L — ABNORMAL LOW (ref 3.5–5.1)
Sodium: 139 mmol/L (ref 135–145)

## 2022-02-23 LAB — HEPATIC FUNCTION PANEL
ALT: 46 U/L — ABNORMAL HIGH (ref 0–44)
AST: 41 U/L (ref 15–41)
Albumin: 4.3 g/dL (ref 3.5–5.0)
Alkaline Phosphatase: 67 U/L (ref 38–126)
Bilirubin, Direct: 0.1 mg/dL (ref 0.0–0.2)
Indirect Bilirubin: 0.9 mg/dL (ref 0.3–0.9)
Total Bilirubin: 1 mg/dL (ref 0.3–1.2)
Total Protein: 7.5 g/dL (ref 6.5–8.1)

## 2022-02-23 LAB — TROPONIN I (HIGH SENSITIVITY): Troponin I (High Sensitivity): 5 ng/L (ref <18)

## 2022-02-23 LAB — POC URINE PREG, ED: Preg Test, Ur: NEGATIVE

## 2022-02-23 NOTE — ED Triage Notes (Signed)
Pt presents to ER from home c/o sternal area chest pain that started around 1800 today and has been constant since then.  Pt states pain is sharp in nature and non-radiating.  Pt is A&O x4 at this time in NAD.  Pt denies sob, but states she is having some nausea.  Denies hx of heart problems.   ?

## 2022-02-23 NOTE — Unmapped (Signed)
Appointment on 04/05    Thanks!

## 2022-02-24 ENCOUNTER — Emergency Department
Admission: EM | Admit: 2022-02-24 | Discharge: 2022-02-24 | Disposition: A | Discharge status: Home / Self Care | Payer: Medicare HMO | Attending: Emergency Medicine | Admitting: Emergency Medicine

## 2022-02-24 ENCOUNTER — Emergency Department: Payer: Medicare HMO

## 2022-02-24 DIAGNOSIS — K2900 Acute gastritis without bleeding: Secondary | ICD-10-CM

## 2022-02-24 DIAGNOSIS — R079 Chest pain, unspecified: Secondary | ICD-10-CM

## 2022-02-24 DIAGNOSIS — R0789 Other chest pain: Secondary | ICD-10-CM | POA: Diagnosis not present

## 2022-02-24 LAB — D-DIMER, QUANTITATIVE: D-Dimer, Quant: 0.51 ug/mL-FEU — ABNORMAL HIGH (ref 0.00–0.50)

## 2022-02-24 LAB — TROPONIN I (HIGH SENSITIVITY): Troponin I (High Sensitivity): 5 ng/L (ref <18)

## 2022-02-24 MED ORDER — ALUM & MAG HYDROXIDE-SIMETH 200-200-20 MG/5ML PO SUSP
30.0000 mL | Freq: Once | ORAL | Status: AC
  Administered 2022-02-24: 30 mL via ORAL
  Filled 2022-02-24: qty 30

## 2022-02-24 MED ORDER — LIDOCAINE VISCOUS HCL 2 % MT SOLN
15.0000 mL | Freq: Once | OROMUCOSAL | Status: AC
  Administered 2022-02-24: 15 mL via ORAL
  Filled 2022-02-24: qty 15

## 2022-02-24 MED ORDER — SODIUM CHLORIDE 0.9 % IV BOLUS
500.0000 mL | Freq: Once | INTRAVENOUS | Status: AC
  Administered 2022-02-24: 500 mL via INTRAVENOUS

## 2022-02-24 MED ORDER — SUCRALFATE 1 GM/10ML PO SUSP: 1.0000 g | Freq: Four times a day (QID) | ORAL | 1 refills | Status: DC

## 2022-02-24 MED ORDER — FAMOTIDINE 20 MG PO TABS: 20.0000 mg | ORAL_TABLET | Freq: Two times a day (BID) | ORAL | 0 refills | Status: DC

## 2022-02-24 MED ORDER — FAMOTIDINE IN NACL 20-0.9 MG/50ML-% IV SOLN
20.0000 mg | Freq: Once | INTRAVENOUS | Status: AC
  Administered 2022-02-24: 20 mg via INTRAVENOUS
  Filled 2022-02-24: qty 50

## 2022-02-24 MED ORDER — IOHEXOL 350 MG/ML SOLN
100.0000 mL | Freq: Once | INTRAVENOUS | Status: AC | PRN
  Administered 2022-02-24: 100 mL via INTRAVENOUS

## 2022-02-24 NOTE — Discharge Instructions (Signed)
1.  Add these medications to the Pantoprazole you are currently taking: ?Pepcid 20 mg twice daily ?Carafate 3 times daily and with meals ?2.  Return to the ER for worsening symptoms, persistent vomiting, difficulty breathing or other concerns. ?

## 2022-02-24 NOTE — ED Notes (Signed)
ED Provider at bedside. 

## 2022-02-24 NOTE — ED Provider Notes (Signed)
? ?[facility] ?Provider Note ? ? ? Event Date/Time  ? First MD Initiated Contact with Patient 02/24/22 0054   ?  (approximate) ? ? ?History  ? ?Chest Pain ? ? ?HPI ? ?Brittney Sanders is a 36 y.o. female who presents to the ED from home with a chief complaint of chest pain.  Patient reports central chest pain beginning around 6 PM while sitting.  Describes nonradiating, sharp and constant chest discomfort worsened by stretching/deep breathing.  Denies associated diaphoresis, palpitations, nausea/vomiting or dizziness.  Denies fever, chills, cough, abdominal pain, diarrhea.  Denies recent travel, trauma or hormone use. ?  ? ? ?Past Medical History  ? ?Past Medical History:  ?Diagnosis Date  ?? Depression   ?? Eczema   ?? Hearing deficit   ?? Heel spur   ?? Hypertension   ?? Kidney stone   ?? Left nephrolithiasis   ?? Nocturia   ?? Obesity   ?? Overweight   ?? Plantar fasciitis   ?? Seasonal allergies   ?? UTI (lower urinary tract infection)   ? ? ? ?Active Problem List  ? ?Patient Active Problem List  ? Diagnosis Date Noted  ?? 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 Auditory disturbance 12/30/2015  ?? Adiposity 12/30/2015  ?? Renal stone 12/17/2015  ?? Pyelonephritis 12/17/2015  ?? 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 Calculus of kidney 08/16/2014  ?? Allergic rhinitis 04/03/2014  ?? Stress fracture of calcaneus 03/14/2014  ?? Calcaneal spur 02/23/2014  ?? Plantar fasciitis 02/23/2014  ? ? ? ?Past Surgical History  ? ?Past Surgical History:  ?Procedure Laterality Date  ?? ESOPHAGOGASTRODUODENOSCOPY (EGD) WITH PROPOFOL N/A 02/12/2022  ? Procedure: ESOPHAGOGASTRODUODENOSCOPY (EGD) WITH PROPOFOL;  Surgeon: Annamaria Helling, DO;  Location: [facility] ENDOSCOPY;  Service: Gastroenterology;  Laterality: N/A;  ?? TUBAL LIGATION    ?? TYMPANOSTOMY TUBE PLACEMENT Right   ? ? ? ?Home Medications  ? ?Prior to Admission medications   ?Medication Sig Start Date End Date Taking? Authorizing Provider   ?clotrimazole-betamethasone (LOTRISONE) cream Apply 1 application. topically 2 (two) times daily. 02/03/22   Felipa Furnace, DPM  ?DUPIXENT 300 MG/2ML prefilled syringe  10/03/19   [provider]  ?Erythromycin 2 % PADS Apply 1 application topically daily. 06/10/21   Felipa Furnace, DPM  ?famotidine (PEPCID) 20 MG tablet Take 1 tablet (20 mg total) by mouth 2 (two) times daily. 10/13/19   Carrie Mew, MD  ?fexofenadine (ALLEGRA) 180 MG tablet Take 180 mg by mouth daily.    [provider]  ?hydrochlorothiazide (MICROZIDE) 12.5 MG capsule Take 12.5 mg by mouth daily.    [provider]  ?liraglutide (VICTOZA) 18 MG/3ML SOPN Inject 0.6 mg into the skin daily.    [provider]  ?magic mouthwash (lidocaine, diphenhydrAMINE, alum & mag hydroxide) suspension Swish and swallow 5 mLs 4 (four) times daily as needed for mouth pain. 11/19/21   Hughie Closs, PA-C  ?meloxicam (MOBIC) 15 MG tablet Take 15 mg by mouth daily. ?Patient not taking: Reported on 02/12/2022    [provider]  ?metoCLOPramide (REGLAN) 10 MG tablet Take 1 tablet (10 mg total) by mouth every 6 (six) hours as needed. 10/13/19   Carrie Mew, MD  ?naproxen (NAPROSYN) 500 MG tablet Take 1 tablet (500 mg total) by mouth 2 (two) times daily with a meal. 02/16/22   Lavonia Drafts, MD  ?nortriptyline (PAMELOR) 10 MG capsule Increase nortriptyline to 30 mg nightly for two weeks, then increase to 40 mg  nightly, refilled. ?Patient not taking: Reported on 02/12/2022 10/02/19   [provider]  ?nystatin (MYCOSTATIN/NYSTOP) powder  05/16/16   [provider]  ?ondansetron (ZOFRAN ODT) 4 MG disintegrating tablet Take 1 tablet (4 mg total) by mouth every 8 (eight) hours as needed for nausea or vomiting. 10/21/19   Paulette Blanch, MD  ?ondansetron (ZOFRAN) 4 MG tablet Take 1 tablet (4 mg total) by mouth every 8 (eight) hours as needed for nausea or vomiting. 09/09/18   Schuyler Amor, MD   ?pantoprazole (PROTONIX) 40 MG tablet Take 1 tablet (40 mg total) by mouth daily. 11/19/21 12/19/21  Hughie Closs, PA-C  ?PARoxetine (PAXIL) 40 MG tablet Take 40 mg by mouth 2 (two) times a day. ?Patient not taking: Reported on 02/12/2022    [provider]  ?potassium chloride SA (KLOR-CON) 20 MEQ tablet Take 1 tablet (20 mEq total) by mouth daily for 5 days. 09/11/21 09/16/21  Vanessa [place], MD  ? ? ? ?Allergies  ?Sulfa antibiotics and Sulfamethoxazole ? ? ?Family History  ? ?Family History  ?Problem Relation Age of Onset  ?? Urolithiasis Maternal Grandmother   ?? Kidney disease Neg Hx   ?? Bladder Cancer Neg Hx   ?? Kidney cancer Neg Hx   ? ? ? ?Physical Exam  ?Triage Vital Signs: ?ED Triage Vitals  ?Enc Vitals Group  ?   BP 02/23/22 2132 (!) 134/99  ?   Pulse Rate 02/23/22 2132 91  ?   Resp 02/23/22 2132 17  ?   Temp 02/23/22 2132 98.9 ?F (37.2 ?C)  ?   Temp Source 02/23/22 2132 Oral  ?   SpO2 02/23/22 2132 100 %  ?   Weight 02/23/22 2133 260 lb (117.9 kg)  ?   Height 02/23/22 2133 5\' 3"  (1.6 m)  ?   Head Circumference --   ?   Peak Flow --   ?   Pain Score 02/23/22 2133 10  ?   Pain Loc --   ?   Pain Edu? --   ?   Excl. in [place]? --   ? ? ?Updated Vital Signs: ?BP 133/78   Pulse 91   Temp 98.9 ?F (37.2 ?C) (Oral)   Resp 14   Ht 5\' 3"  (1.6 m)   Wt 117.9 kg   LMP 01/17/2022   SpO2 100%   BMI 46.06 kg/m?  ? ? ?General: Awake, no distress.  ?CV:  RRR.  Good peripheral perfusion.  ?Resp:  Normal effort.  CTA B. ?Abd:  Nontender to light or deep palpation.  No distention.  ?Other:  No calf swelling or tenderness. ? ? ?ED Results / Procedures / Treatments  ?Labs ?(all labs ordered are listed, but only abnormal results are displayed) ?Labs Reviewed  ?BASIC METABOLIC PANEL - Abnormal; Notable for the following components:  ?    Result Value  ? Potassium 3.2 (*)   ? Glucose, Bld 107 (*)   ? All other components within normal limits  ?CBC - Abnormal; Notable for the following components:  ? WBC 17.9 (*)    ? RBC 5.23 (*)   ? Hemoglobin 15.4 (*)   ? Platelets 408 (*)   ? All other components within normal limits  ?HEPATIC FUNCTION PANEL - Abnormal; Notable for the following components:  ? ALT 46 (*)   ? All other components within normal limits  ?D-DIMER, QUANTITATIVE - Abnormal; Notable for the following components:  ? D-Dimer, Quant 0.51 (*)   ?  All other components within normal limits  ?LIPASE, BLOOD  ?POC URINE PREG, ED  ?TROPONIN I (HIGH SENSITIVITY)  ?TROPONIN I (HIGH SENSITIVITY)  ? ? ? ?EKG ? ?ED ECG REPORT ?I, Paulette Blanch, the attending physician, personally viewed and interpreted this ECG. ? ? Date: 02/24/2022 ? EKG Time: 2129 ? Rate: 100 ? Rhythm: normal sinus rhythm ? Axis: Normal ? Intervals:none ? ST&T Change: Nonspecific ? ? ? ?RADIOLOGY ?I have independently visualized and reviewed patient's chest x-ray as well as noted the radiology interpretation: ? ?Chest x-ray: No acute cardiopulmonary process ? ?CTA chest: No PE ? ?Official radiology report(s): ?DG Chest 2 View ? ?Result Date: 02/23/2022 ?CLINICAL DATA:  Chest pain. EXAM: CHEST - 2 VIEW COMPARISON:  Chest radiograph dated 10/12/2019. FINDINGS: The heart size and mediastinal contours are within normal limits. Both lungs are clear. The visualized skeletal structures are unremarkable. IMPRESSION: No active cardiopulmonary disease. Electronically Signed   By: Anner Crete M.D.   On: 02/23/2022 22:25  ? ?CT Angio Chest PE W/Cm &/Or Wo Cm ? ?Result Date: 02/24/2022 ?CLINICAL DATA:  Substernal chest pain for several hours, initial encounter EXAM: CT ANGIOGRAPHY CHEST WITH CONTRAST TECHNIQUE: Multidetector CT imaging of the chest was performed using the standard protocol during bolus administration of intravenous contrast. Multiplanar CT image reconstructions and MIPs were obtained to evaluate the vascular anatomy. RADIATION DOSE REDUCTION: This exam was performed according to the departmental dose-optimization program which includes automated  exposure control, adjustment of the mA and/or kV according to patient size and/or use of iterative reconstruction technique. CONTRAST:  112mL OMNIPAQUE IOHEXOL 350 MG/ML SOLN COMPARISON:  Chest x-ray from the previous day. FINDINGS: Ca

## 2022-02-24 NOTE — ED Notes (Signed)
Patient transported to CT 

## 2022-02-24 NOTE — ED Notes (Signed)
Pt discharge information reviewed. Pt understands need for follow up care and when to return if symptoms worsen. All questions answered. Pt is alert and oriented with even and regular respirations. Pt is seen ambulating out of department with string steady gait.   

## 2022-03-02 NOTE — Unmapped (Signed)
[facility] Refill Coordination Note    Specialty Medication(s) to be Shipped:   Inflammatory Disorders: Dupixent    Other medication(s) to be shipped: No additional medications requested for fill at this time     Samantha Miles, DOB: 10-Sep-1986  Phone: [phone] (home)       All above HIPAA information was verified with patient.     Was a Nurse, learning disability used for this call? No    Completed refill call assessment today to schedule patient's medication shipment from the [facility] [phone]).  All relevant notes have been reviewed.     Specialty medication(s) and dose(s) confirmed: Regimen is correct and unchanged.   Changes to medications: Samantha Miles reports no changes at this time.  Changes to insurance: No  New side effects reported not previously addressed with a pharmacist or physician: None reported  Questions for the pharmacist: No    Confirmed patient received a Conservation officer, historic buildings and a Surveyor, mining with first shipment. The patient will receive a drug information handout for each medication shipped and additional FDA Medication Guides as required.       DISEASE/MEDICATION-SPECIFIC INFORMATION        For patients on injectable medications: Patient currently has 0 doses left.  Next injection is scheduled for 03/15/2022.    SPECIALTY MEDICATION ADHERENCE     Medication Adherence    Patient reported X missed doses in the last month: 0  Specialty Medication: Dupixent  Patient is on additional specialty medications: No  Any gaps in refill history greater than 2 weeks in the last 3 months: no  Demonstrates understanding of importance of adherence: yes  Informant: patient  Reliability of informant: reliable  Confirmed plan for next specialty medication refill: delivery by pharmacy  Refills needed for supportive medications: not needed              Were doses missed due to medication being on hold? No        REFERRAL TO PHARMACIST     Referral to the pharmacist: Not needed      [facility]     Shipping address confirmed in Epic.     Delivery Scheduled: Yes, Expected medication delivery date: 03/06/2022.     Medication will be delivered via Same Day Courier to the prescription address in Epic WAM.    Lashala Laser D Brent Noto   [facility] Specialty Technician

## 2022-03-03 NOTE — Unmapped (Unsigned)
Dermatology Note     Assessment and Plan:      Benign Lesions/ Findings:   {derm skin check benign A&P:75424}  - Reassurance provided regarding the benign appearance of lesions noted on exam today; no treatment is indicated in the absence of symptoms/changes.  - Reinforced importance of photoprotective strategies including liberal and frequent sunscreen use of a broad-spectrum SPF 30 or greater, use of protective clothing, and sun avoidance for prevention of cutaneous malignancy and photoaging.  Counseled patient on the importance of regular self-skin monitoring as well as routine clinical skin examinations as scheduled.     Personal history of {derm skin check cancer list:75440} ***  - No evidence of recurrence at this time.  - Discussed maintaining vigilance and counseled on sun protection as above.    Atopic dermatitis, chronic, well controlled on Dupixent (since 05/2019)  - Overall significantly improved on dupilumab therapy??  - continue dupilumab (DUPIXENT SYRINGE) 300 mg/2 mL Syrg injection; Inject the contents of 1 syringe (300 mg total) under the skin every fourteen (14) days.??Refilled9/2022  -??continue clobetasol bid prn flares  -We discussed the importance of gentle skin care??and continued use of emollient moisturizer    There are no diagnoses linked to this encounter.    The patient was advised to call for an appointment should any new, changing, or symptomatic lesions develop.     RTC: No follow-ups on file. or sooner as needed   _________________________________________________________________      Chief Complaint     No chief complaint on file.      HPI     Samantha Miles is a 36 y.o. female who presents as a returning patient (last seen 09/03/2021) to Dermatology for follow up of atopic dermatitis on Dupixent. well controlled at [facility]    The patient denies any other new or changing lesions or areas of concern.     Pertinent Past Medical History     Atopic dermatitis- started Dupixent 05/2019  No history of skin cancer    Problem List    None        Family History:   Negative for melanoma    Past Medical History, Family History, Social History, Medication List, Allergies, and Problem List were reviewed in the rooming section of Epic.     ROS: Other than symptoms mentioned in the HPI, no fevers, chills, or other skin complaints    Physical Examination     GENERAL: Well-appearing female in no acute distress, resting comfortably.  {PE systems:75418::NEURO: Alert and oriented, answers questions appropriately}  {PE extent:75514}  {PE list:75421}  ***    {PE limitations:75419::All areas not commented on are within normal limits or unremarkable}      (Approved Template 08/12/2020)

## 2022-03-06 MED FILL — DUPIXENT 300 MG/2 ML SUBCUTANEOUS PEN INJECTOR: SUBCUTANEOUS | 28 days supply | Qty: 4 | Fill #1

## 2022-03-27 NOTE — Unmapped (Signed)
Samantha Miles reports she's had some eczema flares while the seasons changed and she has not yet trialed weaning off her Dupixent. She continues to take every 14 days, and uses topical medications PRN. She denies eye irritation or other adverse effects.     [facility] Clinical Assessment & Refill Coordination Note    Kalilah Barua, DOB: [birth year]  Phone: [phone] (home)     All above HIPAA information was verified with patient.     Was a Nurse, learning disability used for this call? No    Specialty Medication(s):   Inflammatory Disorders: Dupixent     Current Outpatient Medications   Medication Sig Dispense Refill    betamethasone dipropionate (DIPROLENE) 0.05 % ointment betamethasone dipropionate 0.05 % topical ointment      betamethasone, augmented, (DIPROLENE) 0.05 % cream Apply twice daily to areas of eczema until flat and smooth. Avoid face and skin folds 50 g 5    clobetasoL (TEMOVATE) 0.05 % ointment Apply to eczema on hands at night until resolved. 60 g 5    dupilumab 300 mg/2 mL PnIj Inject the contents of 1 pen (300 mg) under the skin every fourteen (14) days. 4 mL 7    empty container Misc Use as directed to dispose of Dupixent syringes. 1 each 2    fexofenadine (ALLEGRA) 180 MG tablet Take 180 mg by mouth daily.  11    guaiFENesin 200 mg tablet Take 2 tablets (400 mg total) by mouth every six (6) hours as needed for congestion. 30 tablet 0    hydroCHLOROthiazide (HYDRODIURIL) 12.5 MG tablet Take 12.5 mg by mouth daily.      ibuprofen (ADVIL,MOTRIN) 800 MG tablet TAKE 1 TABLET BY MOUTH EVERY 8 HOURS AS NEEDED FOR MODERATE PAIN  0    lidocaine 2% viscous (LIDOCAINE) 2 % Soln 10 mL by Mouth route every three (3) hours as needed (sore throat). 120 mL 0    meloxicam (MOBIC) 15 MG tablet Mobic 15 mg tablet   Take 1 tablet(s) every day by oral route.      nystatin (MYCOSTATIN) 100,000 unit/gram ointment Apply 1 application topically Two (2) times a day. To scaly areas on feet and between toes 30 g 5    nystatin (MYCOSTATIN) powder       ondansetron (ZOFRAN) 4 MG tablet Take 4 mg by mouth.      oxyCODONE (ROXICODONE) 5 MG immediate release tablet Take 1 tablet (5 mg total) by mouth every four (4) hours as needed for pain for up to 12 doses. 12 tablet 0    PARoxetine (PAXIL) 40 MG tablet       TRUEPLUS PEN NEEDLE 31 gauge x 1/4 (6 mm) Ndle       VICTOZA 2-PAK 0.6 mg/0.1 mL (18 mg/3 mL) injection        No current facility-administered medications for this visit.        Changes to medications: Annison reports no changes at this time.    Allergies   Allergen Reactions    Sulfacetamide Sodium Hives and Rash    Sulfamethoxazole Rash       Changes to allergies: No    SPECIALTY MEDICATION ADHERENCE     Dupixent - 1 left  Medication Adherence    Patient reported X missed doses in the last month: 0  Specialty Medication: Dupixent          Specialty medication(s) dose(s) confirmed: Regimen is correct and unchanged.     Are there  any concerns with adherence? No    Adherence counseling provided? Not needed    CLINICAL MANAGEMENT AND INTERVENTION      Clinical Benefit Assessment:    Do you feel the medicine is effective or helping your condition? Yes    Clinical Benefit counseling provided? Not needed    Adverse Effects Assessment:    Are you experiencing any side effects? No    Are you experiencing difficulty administering your medicine? No    Quality of Life Assessment:    Quality of Life    Rheumatology  Oncology  Dermatology  1. What impact has your specialty medication had on the symptoms of your skin condition (i.e. itchiness, soreness, stinging)?: Some  2. What impact has your specialty medication had on your comfort level with your skin?: Some  Cystic Fibrosis          Have you discussed this with your provider? Not needed    Acute Infection Status:    Acute infections noted within Epic:  No active infections  Patient reported infection: None    Therapy Appropriateness:    Is therapy appropriate and patient progressing towards therapeutic goals? Yes, therapy is appropriate and should be continued    DISEASE/MEDICATION-SPECIFIC INFORMATION      For patients on injectable medications: Patient currently has 1 doses left.  Next injection is scheduled for Sunday, 4/30.    PATIENT SPECIFIC NEEDS     Does the patient have any physical, cognitive, or cultural barriers? No    Is the patient high risk? No    Does the patient require a Care Management Plan? No     SHIPPING     Specialty Medication(s) to be Shipped:   Inflammatory Disorders: Dupixent    Other medication(s) to be shipped: No additional medications requested for fill at this time     Changes to insurance: No    Delivery Scheduled: Yes, Expected medication delivery date: 5/5.     Medication will be delivered via Same Day Courier to the confirmed prescription address in [facility].    The patient will receive a drug information handout for each medication shipped and additional FDA Medication Guides as required.  Verified that patient has previously received a Conservation officer, historic buildings and a Surveyor, mining.    The patient or caregiver noted above participated in the development of this care plan and knows that they can request review of or adjustments to the care plan at any time.      All of the patient's questions and concerns have been addressed.    Lanney Gins   [facility] Specialty Pharmacist

## 2022-04-03 MED FILL — DUPIXENT 300 MG/2 ML SUBCUTANEOUS PEN INJECTOR: SUBCUTANEOUS | 28 days supply | Qty: 4 | Fill #2

## 2022-04-07 ENCOUNTER — Encounter: Payer: Self-pay | Admitting: Obstetrics

## 2022-04-07 ENCOUNTER — Ambulatory Visit (INDEPENDENT_AMBULATORY_CARE_PROVIDER_SITE_OTHER): Payer: Medicare HMO | Admitting: Obstetrics

## 2022-04-07 ENCOUNTER — Other Ambulatory Visit (HOSPITAL_COMMUNITY)
Admission: RE | Admit: 2022-04-07 | Discharge: 2022-04-07 | Disposition: A | Discharge status: Home / Self Care | Payer: Medicare HMO | Source: Ambulatory Visit | Attending: Obstetrics | Admitting: Obstetrics

## 2022-04-07 VITALS — BP 128/87 | HR 108 | Ht 63.0 in | Wt 247.7 lb

## 2022-04-07 DIAGNOSIS — Z124 Encounter for screening for malignant neoplasm of cervix: Secondary | ICD-10-CM

## 2022-04-07 DIAGNOSIS — Z1151 Encounter for screening for human papillomavirus (HPV): Secondary | ICD-10-CM | POA: Insufficient documentation

## 2022-04-07 DIAGNOSIS — R399 Unspecified symptoms and signs involving the genitourinary system: Secondary | ICD-10-CM | POA: Diagnosis not present

## 2022-04-07 DIAGNOSIS — Z01419 Encounter for gynecological examination (general) (routine) without abnormal findings: Secondary | ICD-10-CM | POA: Diagnosis present

## 2022-04-07 NOTE — Progress Notes (Signed)
GYN ENCOUNTER ? ?Encounter for Evaluation of Abnormal Uterine Bleeding ? ?Subjective ? ?HPI: Brittney Sanders is a 36 y.o. No obstetric history on file. who presents today for evaluation of abnormal uterine bleeding. She reports that her periods have been normal until January. At tat time, she had a period that lasted for 13 days, stopped for 2-3 days, and then returned for the entire month of February. The bleeding ranged from light spotting to heavy bleeding with clots. The bleeding was initially painful but then the pain resolved. She had a pelvic US which showed no fibroids, cysts, or abnormalities and an endometrial thickness of 9.85mm. She saw her regular GYN and was given a packet of OCPs, which stopped the bleeding. She was also advised to start a 1000 calorie diet and lose weight. She also reports dark, malodorous urine. She is currently sexually active with one female partner and had a a BTL. She declines STI testing today. She is unsure when she had her last Pap and would like that done today. ? ?Past Medical History:  ?Diagnosis Date  ? Depression   ? Eczema   ? Hearing deficit   ? Heel spur   ? Hypertension   ? Kidney stone   ? Left nephrolithiasis   ? Nocturia   ? Obesity   ? Overweight   ? Plantar fasciitis   ? Seasonal allergies   ? UTI (lower urinary tract infection)   ? ?Past Surgical History:  ?Procedure Laterality Date  ? ESOPHAGOGASTRODUODENOSCOPY (EGD) WITH PROPOFOL N/A 02/12/2022  ? Procedure: ESOPHAGOGASTRODUODENOSCOPY (EGD) WITH PROPOFOL;  Surgeon: Jaynie Collins, DO;  Location: [facility] ENDOSCOPY;  Service: Gastroenterology;  Laterality: N/A;  ? TUBAL LIGATION    ? TYMPANOSTOMY TUBE PLACEMENT Right   ? ?OB History   ?No obstetric history on file. ?  ? ?Allergies  ?Allergen Reactions  ? Sulfa Antibiotics Hives and Rash  ? Sulfamethoxazole Rash  ? ? ?ROS ? ?Negative except as noted in HPI ? ?Objective ? ?BP 128/87   Pulse (!) 108   Ht 5\' 3"  (1.6 m)   Wt 247 lb 11.2 oz (112.4 kg)   LMP  02/09/2022 (Exact Date)   BMI 43.88 kg/m?  ? ?General appearance: alert, cooperative ?Head: Normocephalic, without obvious abnormality ?Eyes: negative findings: lids and lashes normal and conjunctivae and sclerae normal ?Neck: supple, symmetrical, trachea midline and thyroid: not enlarged, symmetric, no tenderness/mass/nodules ?Lungs: clear to auscultation bilaterally ?Breasts: deferred ?Heart: regular rate and rhythm, S1, S2 normal, no murmur, click, rub or gallop ?Abdomen: soft, non-tender. Bowel sounds normal. No masses,  no organomegaly ?Pelvic: External genitalia normal, Vagina normal without discharge, cervix normal in appearance, no CMT, uterus normal size, shape, and consistency, no adnexal masses or tenderness, Pap collected ?Extremities: extremities normal, atraumatic, no cyanosis or edema ?Pulses: 2+ and symmetric ?Skin: Skin color, texture, turgor normal. No rashes or lesions ?Lymph nodes: Cervical, supraclavicular, and axillary nodes normal. ? ?Assessment ?1) Abnormal uterine bleeding ?2) Desires Pap ? ?Plan ?1) Bleeding currently controlled on OCPs. Discussed potential causes of AUB and perimenopausal changes. Will re-evaluate after completion of OCP course to see if further bleeding occurs. ?2) Pap collected. F/u based on results. ? ?Urine culture collected. ? ? ?02/11/2022, CNM ? ? ?  ?

## 2022-04-09 LAB — URINE CULTURE: Organism ID, Bacteria: NO GROWTH

## 2022-04-10 ENCOUNTER — Encounter: Payer: Self-pay | Admitting: Obstetrics

## 2022-04-10 LAB — CYTOLOGY - PAP
Comment: NEGATIVE
Diagnosis: NEGATIVE
High risk HPV: NEGATIVE

## 2022-04-16 ENCOUNTER — Emergency Department
Admission: EM | Admit: 2022-04-16 | Discharge: 2022-04-16 | Disposition: A | Discharge status: Home / Self Care | Payer: Medicare HMO | Attending: Emergency Medicine | Admitting: Emergency Medicine

## 2022-04-16 ENCOUNTER — Other Ambulatory Visit: Payer: Self-pay

## 2022-04-16 ENCOUNTER — Telehealth: Payer: Self-pay | Admitting: Obstetrics

## 2022-04-16 ENCOUNTER — Emergency Department: Payer: Medicare HMO

## 2022-04-16 DIAGNOSIS — N9489 Other specified conditions associated with female genital organs and menstrual cycle: Secondary | ICD-10-CM | POA: Diagnosis not present

## 2022-04-16 DIAGNOSIS — N939 Abnormal uterine and vaginal bleeding, unspecified: Secondary | ICD-10-CM | POA: Diagnosis not present

## 2022-04-16 DIAGNOSIS — R109 Unspecified abdominal pain: Secondary | ICD-10-CM | POA: Diagnosis present

## 2022-04-16 DIAGNOSIS — R110 Nausea: Secondary | ICD-10-CM | POA: Diagnosis not present

## 2022-04-16 DIAGNOSIS — N3000 Acute cystitis without hematuria: Secondary | ICD-10-CM | POA: Insufficient documentation

## 2022-04-16 LAB — URINALYSIS, ROUTINE W REFLEX MICROSCOPIC
Bacteria, UA: NONE SEEN
Bilirubin Urine: NEGATIVE
Glucose, UA: NEGATIVE mg/dL
Ketones, ur: 5 mg/dL — AB
Nitrite: NEGATIVE
Protein, ur: 30 mg/dL — AB
RBC / HPF: >50 RBC/hpf — ABNORMAL HIGH (ref 0–5)
Specific Gravity, Urine: 1.017 (ref 1.005–1.030)
WBC, UA: >50 WBC/hpf — ABNORMAL HIGH (ref 0–5)
pH: 7 (ref 5.0–8.0)

## 2022-04-16 LAB — CBC WITH DIFFERENTIAL/PLATELET
Abs Immature Granulocytes: 0.04 10*3/uL (ref 0.00–0.07)
Basophils Absolute: 0.1 10*3/uL (ref 0.0–0.1)
Basophils Relative: 0 %
Eosinophils Absolute: 0.1 10*3/uL (ref 0.0–0.5)
Eosinophils Relative: 0 %
HCT: 42.1 % (ref 36.0–46.0)
Hemoglobin: 14 g/dL (ref 12.0–15.0)
Immature Granulocytes: 0 %
Lymphocytes Relative: 25 %
Lymphs Abs: 3.4 10*3/uL (ref 0.7–4.0)
MCH: 28.6 pg (ref 26.0–34.0)
MCHC: 33.3 g/dL (ref 30.0–36.0)
MCV: 86.1 fL (ref 80.0–100.0)
Monocytes Absolute: 0.6 10*3/uL (ref 0.1–1.0)
Monocytes Relative: 4 %
Neutro Abs: 9.7 10*3/uL — ABNORMAL HIGH (ref 1.7–7.7)
Neutrophils Relative %: 71 %
Platelets: 424 10*3/uL — ABNORMAL HIGH (ref 150–400)
RBC: 4.89 MIL/uL (ref 3.87–5.11)
RDW: 12.9 % (ref 11.5–15.5)
WBC: 13.8 10*3/uL — ABNORMAL HIGH (ref 4.0–10.5)
nRBC: 0 % (ref 0.0–0.2)

## 2022-04-16 LAB — LIPASE, BLOOD: Lipase: 30 U/L (ref 11–51)

## 2022-04-16 LAB — COMPREHENSIVE METABOLIC PANEL
ALT: 27 U/L (ref 0–44)
AST: 33 U/L (ref 15–41)
Albumin: 4.1 g/dL (ref 3.5–5.0)
Alkaline Phosphatase: 51 U/L (ref 38–126)
Anion gap: 12 (ref 5–15)
BUN: 11 mg/dL (ref 6–20)
CO2: 27 mmol/L (ref 22–32)
Calcium: 9.3 mg/dL (ref 8.9–10.3)
Chloride: 98 mmol/L (ref 98–111)
Creatinine, Ser: 0.88 mg/dL (ref 0.44–1.00)
GFR, Estimated: >60 mL/min (ref >60)
Glucose, Bld: 118 mg/dL — ABNORMAL HIGH (ref 70–99)
Potassium: 2.6 mmol/L — CL (ref 3.5–5.1)
Sodium: 137 mmol/L (ref 135–145)
Total Bilirubin: 0.5 mg/dL (ref 0.3–1.2)
Total Protein: 7.7 g/dL (ref 6.5–8.1)

## 2022-04-16 LAB — MAGNESIUM: Magnesium: 1.9 mg/dL (ref 1.7–2.4)

## 2022-04-16 LAB — HCG, QUANTITATIVE, PREGNANCY: hCG, Beta Chain, Quant, S: <1 m[IU]/mL (ref <5)

## 2022-04-16 LAB — POC URINE PREG, ED: Preg Test, Ur: NEGATIVE

## 2022-04-16 MED ORDER — POTASSIUM CHLORIDE 10 MEQ/100ML IV SOLN
10.0000 meq | INTRAVENOUS | Status: AC
  Administered 2022-04-16 (×2): 10 meq via INTRAVENOUS
  Filled 2022-04-16 (×2): qty 100

## 2022-04-16 MED ORDER — KETOROLAC TROMETHAMINE 15 MG/ML IJ SOLN
15.0000 mg | Freq: Once | INTRAMUSCULAR | Status: AC
Start: 2022-04-16 — End: 2022-04-16
  Administered 2022-04-16: 15 mg via INTRAVENOUS
  Filled 2022-04-16: qty 1

## 2022-04-16 MED ORDER — ONDANSETRON HCL 4 MG/2ML IJ SOLN
4.0000 mg | Freq: Once | INTRAMUSCULAR | Status: AC
  Administered 2022-04-16: 4 mg via INTRAVENOUS
  Filled 2022-04-16: qty 2

## 2022-04-16 MED ORDER — CEPHALEXIN 500 MG PO CAPS: 500.0000 mg | ORAL_CAPSULE | Freq: Two times a day (BID) | ORAL | 0 refills | Status: DC

## 2022-04-16 MED ORDER — OXYCODONE HCL 5 MG PO TABS
5.0000 mg | ORAL_TABLET | Freq: Once | ORAL | Status: AC
  Administered 2022-04-16: 5 mg via ORAL
  Filled 2022-04-16: qty 1

## 2022-04-16 MED ORDER — SODIUM CHLORIDE 0.9 % IV BOLUS
1000.0000 mL | Freq: Once | INTRAVENOUS | Status: AC
Start: 2022-04-16 — End: 2022-04-16
  Administered 2022-04-16: 1000 mL via INTRAVENOUS

## 2022-04-16 MED ORDER — OXYCODONE HCL 5 MG PO TABS: 5.0000 mg | ORAL_TABLET | Freq: Four times a day (QID) | ORAL | 0 refills | Status: AC | PRN

## 2022-04-16 MED ORDER — POTASSIUM CHLORIDE CRYS ER 20 MEQ PO TBCR: 20.0000 meq | EXTENDED_RELEASE_TABLET | Freq: Two times a day (BID) | ORAL | 0 refills | Status: DC

## 2022-04-16 MED ORDER — CEFTRIAXONE SODIUM 2 G IJ SOLR
2.0000 g | Freq: Once | INTRAMUSCULAR | Status: AC
  Administered 2022-04-16: 2 g via INTRAVENOUS
  Filled 2022-04-16: qty 20

## 2022-04-16 MED ORDER — IOHEXOL 300 MG/ML  SOLN
100.0000 mL | Freq: Once | INTRAMUSCULAR | Status: AC | PRN
  Administered 2022-04-16: 100 mL via INTRAVENOUS

## 2022-04-16 MED ORDER — FENTANYL CITRATE PF 50 MCG/ML IJ SOSY
50.0000 ug | PREFILLED_SYRINGE | Freq: Once | INTRAMUSCULAR | Status: AC
  Administered 2022-04-16: 50 ug via INTRAVENOUS
  Filled 2022-04-16: qty 1

## 2022-04-16 NOTE — ED Notes (Signed)
See triage note. Pt states Sx began at approx 0200 this morning. Pt indicates pain distributed across bilateral lower abdomen. Pt describes some accompanying nausea but denies vomiting or diarrhea. Last bowel movement was yesterday evening and described as "normal". Pt states she was recently placed on [facility] for pre-menopause Sx and has been taking them for approx 2 months.

## 2022-04-16 NOTE — Discharge Instructions (Addendum)
You can take Tylenol 1 g every 8 hours and ibuprofen 600 every 6-8 hours to help with pain.  Use the oxycodone for breakthrough pain.  Do not drive on this and take the antibiotics and call your OB/GYN to get follow-up.  Your potassium was also low and you should follow-up next week for a recheck.   IMPRESSION: 1. Markedly thickened and heterogeneous endometrium, measuring up to 3.2 cm in thickness. This is nonspecific in a premenopausal patient with a normal appearing endometrium on prior examination 3 months prior. Please correlate with menstrual history and beta HCG levels. If there are symptoms of irregular menstrual bleeding, gynecologic follow-up is recommended. Otherwise, follow-up ultrasound in 6-8 weeks during the week following menses is recommended for reassessment. 2. Otherwise unremarkable pelvic ultrasound.  Take oxycodone as prescribed. Do not drink alcohol, drive or participate in any other potentially dangerous activities while taking this medication as it may make you sleepy. Do not take this medication with any other sedating medications, either prescription or over-the-counter. If you were prescribed Percocet or Vicodin, do not take these with acetaminophen (Tylenol) as it is already contained within these medications.  This medication is an opiate (or narcotic) pain medication and can be habit forming. Use it as little as possible to achieve adequate pain control. Do not use or use it with extreme caution if you have a history of opiate abuse or dependence. If you are on a pain contract with your primary care doctor or a pain specialist, be sure to let them know you were prescribed this medication today from the Emergency Department. This medication is intended for your use only - do not give any to anyone else and keep it in a secure place where nobody else, especially children, have access to it.

## 2022-04-16 NOTE — Telephone Encounter (Signed)
Pt called asking to be worked in today due to severe cramping. Pt states "it is unbearable , very heavy bleeding, soaking her pad". Pt states she is very nauseous" and is wondering can she be seen or prescribed meds?

## 2022-04-16 NOTE — ED Notes (Signed)
MD Jari Pigg informed of pt's K+ 2.2

## 2022-04-16 NOTE — ED Provider Notes (Signed)
[facility] Provider Note    Event Date/Time   First MD Initiated Contact with Patient 04/16/22 1131     (approximate)   History   Abdominal Pain   HPI  Ura M Cuen is a 36 y.o. female with hyperthyroidism who comes in with lower abdominal discomfort that started at 2 AM this morning.  Patient reports sudden onset of severe pain in her lower abdomen on both sides associated with nausea.  She denies any other associated symptoms.  She reports being with 1 sexual partner and denied any concerns for STDs given she has been with him for many years.  She had 2 prior pregnancies.  Denies any vaginal discharge.  Denies any chest pain, shortness of breath or other concerns.  Physical Exam   Triage Vital Signs: ED Triage Vitals  Enc Vitals Group     BP 04/16/22 1118 (!) 145/103     Pulse Rate 04/16/22 1118 (!) 101     Resp 04/16/22 1118 19     Temp 04/16/22 1118 98.6 F (37 C)     Temp Source 04/16/22 1118 Oral     SpO2 04/16/22 1118 97 %     Weight --      Height --      Head Circumference --      Peak Flow --      Pain Score 04/16/22 1123 10     Pain Loc --      Pain Edu? --      Excl. in [place]? --     Most recent vital signs: Vitals:   04/16/22 1118  BP: (!) 145/103  Pulse: (!) 101  Resp: 19  Temp: 98.6 F (37 C)  SpO2: 97%     General: Awake, no distress.  CV:  Good peripheral perfusion.  Resp:  Normal effort.  Abd:  No distention.  Tender throughout the lower abdomen. Other:     ED Results / Procedures / Treatments   Labs (all labs ordered are listed, but only abnormal results are displayed) Labs Reviewed  COMPREHENSIVE METABOLIC PANEL  LIPASE, BLOOD  CBC WITH DIFFERENTIAL/PLATELET  URINALYSIS, ROUTINE W REFLEX MICROSCOPIC  POC URINE PREG, ED     RADIOLOGY I have reviewed the CT personally and my interpretation is no signs of kidney stone or appendicitis.    PROCEDURES:  Critical Care performed:  No  Procedures   MEDICATIONS ORDERED IN ED: Medications - No data to display   IMPRESSION / MDM / [place] / ED COURSE  I reviewed the triage vital signs and the nursing notes.  Patient comes in slightly tachycardic and hypertensive with concern for significant lower abdominal tenderness.  I am concerned about acute life-threatening pathology and my differential includes appendicitis, abscess, obstruction, diverticulitis, ovarian pathology.  We will give some IV fentanyl IV Zofran IV fluids given patient's symptoms.  Preg test negative  UA looks concerning for UTI but patient denied any urinary symptoms.  Her CT scan was concerning for thickened endometrium which she never endorsed any vaginal bleeding earlier but now she reports that she did have some vaginal bleeding earlier today.  She does report being on birth control 6 weeks due to some irregular bleeding.  Given the continued discomfort with the bleeding will get repeat ultrasound to evaluate for acute pathology.  Her CBC shows slightly elevated white count lipase normal CMP shows low potassium and she is given some IV replacement but chronically runs low between 3.1 and  3.3.  Mag was normal   EKG my interpretation is normal sinus rate of 73 without any ST elevation, T wave inversions and normal intervals   Patient's ultrasound shows a thickened endometrium but difficult to interpret given she is premenopausal her hCG level was negative.  Patient does have an OB/GYN that she can follow-up for.  She denies significant bleeding at this time.  Patient reports pain getting better after the oxycodone.  We discussed a short course of oxycodone but the risk and not to drive or work on it.  We have also will prescribe some antibiotics to help with UTI.  We have also discussed her low potassium and given she has had no nausea or vomiting and can tolerate p.o. she feels comfortable managing it outpatient.  She is already on 20 of IV  and 40 of p.o. here in the emergency room and EKG without evidence of arrhythmia.  She understands the importance of following up for recheck next week and that if she is unable to keep the pills down return to the ER immediately for recheck here   Patient is well-appearing although patient technically met sepsis criteria with elevated heart rate her heart rate has normalized with controlling her pain and there is no fever I do not feel that patient is bacteremic    FINAL CLINICAL IMPRESSION(S) / ED DIAGNOSES   Final diagnoses:  Vaginal bleeding  Abdominal pain, unspecified abdominal location  Acute cystitis without hematuria     Rx / DC Orders   ED Discharge Orders          Ordered    cephALEXin (KEFLEX) 500 MG capsule  2 times daily        04/16/22 1724    oxyCODONE (ROXICODONE) 5 MG immediate release tablet  Every 6 hours PRN        04/16/22 1724    potassium chloride SA (KLOR-CON M) 20 MEQ tablet  2 times daily        04/16/22 1725             Note:  This document was prepared using Dragon voice recognition software and may include unintentional dictation errors.   Vanessa , MD 04/16/22 1726

## 2022-04-16 NOTE — ED Notes (Signed)
RN attempted IV with no success. IV consult placed.

## 2022-04-16 NOTE — ED Triage Notes (Signed)
Pt here with lower abd pain that started at 0200 this morning and has been constant. Pt states pain is all along the lower part of her abd. Pt endorses nausea but denies V/D.

## 2022-04-17 LAB — URINE CULTURE

## 2022-04-18 ENCOUNTER — Other Ambulatory Visit: Payer: Self-pay

## 2022-04-18 ENCOUNTER — Emergency Department: Payer: Medicare HMO

## 2022-04-18 ENCOUNTER — Emergency Department
Admission: EM | Admit: 2022-04-18 | Discharge: 2022-04-18 | Disposition: A | Discharge status: Home / Self Care | Payer: Medicare HMO | Attending: Emergency Medicine | Admitting: Emergency Medicine

## 2022-04-18 ENCOUNTER — Encounter: Payer: Self-pay | Admitting: Emergency Medicine

## 2022-04-18 DIAGNOSIS — N390 Urinary tract infection, site not specified: Secondary | ICD-10-CM | POA: Insufficient documentation

## 2022-04-18 DIAGNOSIS — R109 Unspecified abdominal pain: Secondary | ICD-10-CM | POA: Diagnosis present

## 2022-04-18 DIAGNOSIS — N939 Abnormal uterine and vaginal bleeding, unspecified: Secondary | ICD-10-CM | POA: Diagnosis not present

## 2022-04-18 LAB — CBC WITH DIFFERENTIAL/PLATELET
Abs Immature Granulocytes: 0.05 10*3/uL (ref 0.00–0.07)
Basophils Absolute: 0.1 10*3/uL (ref 0.0–0.1)
Basophils Relative: 0 %
Eosinophils Absolute: 0.1 10*3/uL (ref 0.0–0.5)
Eosinophils Relative: 1 %
HCT: 42.1 % (ref 36.0–46.0)
Hemoglobin: 13.9 g/dL (ref 12.0–15.0)
Immature Granulocytes: 0 %
Lymphocytes Relative: 22 %
Lymphs Abs: 3.6 10*3/uL (ref 0.7–4.0)
MCH: 28.6 pg (ref 26.0–34.0)
MCHC: 33 g/dL (ref 30.0–36.0)
MCV: 86.6 fL (ref 80.0–100.0)
Monocytes Absolute: 0.9 10*3/uL (ref 0.1–1.0)
Monocytes Relative: 6 %
Neutro Abs: 11.5 10*3/uL — ABNORMAL HIGH (ref 1.7–7.7)
Neutrophils Relative %: 71 %
Platelets: 384 10*3/uL (ref 150–400)
RBC: 4.86 MIL/uL (ref 3.87–5.11)
RDW: 12.9 % (ref 11.5–15.5)
WBC: 16.3 10*3/uL — ABNORMAL HIGH (ref 4.0–10.5)
nRBC: 0 % (ref 0.0–0.2)

## 2022-04-18 LAB — COMPREHENSIVE METABOLIC PANEL
ALT: 26 U/L (ref 0–44)
AST: 32 U/L (ref 15–41)
Albumin: 3.9 g/dL (ref 3.5–5.0)
Alkaline Phosphatase: 57 U/L (ref 38–126)
Anion gap: 8 (ref 5–15)
BUN: 8 mg/dL (ref 6–20)
CO2: 29 mmol/L (ref 22–32)
Calcium: 9.3 mg/dL (ref 8.9–10.3)
Chloride: 100 mmol/L (ref 98–111)
Creatinine, Ser: 0.87 mg/dL (ref 0.44–1.00)
GFR, Estimated: >60 mL/min (ref >60)
Glucose, Bld: 102 mg/dL — ABNORMAL HIGH (ref 70–99)
Potassium: 3.1 mmol/L — ABNORMAL LOW (ref 3.5–5.1)
Sodium: 137 mmol/L (ref 135–145)
Total Bilirubin: 0.6 mg/dL (ref 0.3–1.2)
Total Protein: 7.5 g/dL (ref 6.5–8.1)

## 2022-04-18 LAB — WET PREP, GENITAL
Clue Cells Wet Prep HPF POC: NONE SEEN
Sperm: NONE SEEN
Trich, Wet Prep: NONE SEEN
WBC, Wet Prep HPF POC: <10 (ref <10)
Yeast Wet Prep HPF POC: NONE SEEN

## 2022-04-18 LAB — URINALYSIS, COMPLETE (UACMP) WITH MICROSCOPIC
Bacteria, UA: NONE SEEN
Bilirubin Urine: NEGATIVE
Glucose, UA: NEGATIVE mg/dL
Ketones, ur: NEGATIVE mg/dL
Nitrite: NEGATIVE
Protein, ur: 100 mg/dL — AB
RBC / HPF: >50 RBC/hpf — ABNORMAL HIGH (ref 0–5)
Specific Gravity, Urine: 1.02 (ref 1.005–1.030)
WBC, UA: >50 WBC/hpf — ABNORMAL HIGH (ref 0–5)
pH: 7 (ref 5.0–8.0)

## 2022-04-18 LAB — PREGNANCY, URINE: Preg Test, Ur: NEGATIVE

## 2022-04-18 LAB — CHLAMYDIA/NGC RT PCR (ARMC ONLY)
Chlamydia Tr: NOT DETECTED
N gonorrhoeae: NOT DETECTED

## 2022-04-18 MED ORDER — ACETAMINOPHEN 325 MG PO TABS
650.0000 mg | ORAL_TABLET | Freq: Once | ORAL | Status: AC
  Administered 2022-04-18: 650 mg via ORAL
  Filled 2022-04-18: qty 2

## 2022-04-18 MED ORDER — SODIUM CHLORIDE 0.9 % IV BOLUS
1000.0000 mL | Freq: Once | INTRAVENOUS | Status: AC
  Administered 2022-04-18: 1000 mL via INTRAVENOUS

## 2022-04-18 MED ORDER — SODIUM CHLORIDE 0.9 % IV SOLN
1.0000 g | INTRAVENOUS | Status: DC
  Administered 2022-04-18: 1 g via INTRAVENOUS
  Filled 2022-04-18: qty 10

## 2022-04-18 MED ORDER — CEFPODOXIME PROXETIL 100 MG PO TABS
100.0000 mg | ORAL_TABLET | Freq: Two times a day (BID) | ORAL | 0 refills | Status: AC
Start: 2022-04-18 — End: 2022-04-28

## 2022-04-18 NOTE — ED Notes (Signed)
Pelvic already completed by provider. Pt requesting something for her soreness. Provider Poggi notified via secure chat. Awaiting reply.

## 2022-04-18 NOTE — Discharge Instructions (Addendum)
Take the antibiotics as prescribed. Please follow up with OBGYN. Please return for any new worsening or change in symptoms or other concerns.

## 2022-04-18 NOTE — ED Notes (Signed)
Pelvic cart at bedside, pt educated, pt changing into gown and given warm blanket. Visitor remains at bedside.

## 2022-04-18 NOTE — ED Triage Notes (Signed)
Pt presents to ED with c/o lower abd pain and lower back pain. Has been ongoing since 5/18. Pt was seen in this ED and dx with UTI. Pt states she has been taking her antibiotics as prescribed and her symptoms are not better. Denies fever or vomiting.

## 2022-04-18 NOTE — ED Notes (Addendum)
Pt confirms dx of UTI 2 days ago and has only been on antibiotics for that long. Pt ambulatory and in NAD. Visitor at bedside. Pt reports pain med she has been on has not changed her pain at all. Is on potassium and antibiotic scheduled and oxy as needed. Visitor has bag which they collected blood clot in to bring with them as pt states released this from her vagina.

## 2022-04-18 NOTE — ED Notes (Signed)
Called lab; Marcelino Duster confirms they received wet prep and chlamydia swab earlier. She states she will mark them as received in a moment.

## 2022-04-18 NOTE — ED Provider Notes (Signed)
[facility] Provider Note    Event Date/Time   First MD Initiated Contact with Patient 04/18/22 1352     (approximate)   History   Abdominal Pain   HPI  Brittney Sanders is a 36 y.o. female with a past medical history of pyelonephritis, renal stones, obesity who presents today for evaluation of abdominal pain.  Patient was seen in the emergency department on 04/16/2022 with the same complaint.  Patient reports that today she has abdominal pain across her low abdomen and into the middle of her back.  She reports that she began to have heavier vaginal bleeding today, with clots.  She estimates that she has gone through 6-8 pads today.  She denies any dizziness.  She has been taking the antibiotic for UTI which was prescribed for her 2 days ago.  She denies any burning with urination.  She denies flank pain or unilateral abdominal pain.  She reports that she has irregular menses at baseline, last period was in March. She is on levonorgestrel and ethinyl estradiol birth control pill.  Urine culture on 04/16/2022 demonstrates multiple species present, recollection recommended.    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   Calculus of kidney 08/16/2014   Allergic rhinitis 04/03/2014   Stress fracture of calcaneus 03/14/2014   Calcaneal spur 02/23/2014   Plantar fasciitis 02/23/2014          Physical Exam   Triage Vital Signs: ED Triage Vitals  Enc Vitals Group     BP 04/18/22 1344 (!) 155/100     Pulse Rate 04/18/22 1344 (!) 115     Resp 04/18/22 1344 18     Temp 04/18/22 1344 98.3 F (36.8 C)     Temp src --      SpO2 04/18/22 1344 100 %     Weight 04/18/22 1339 248 lb (112.5 kg)     Height 04/18/22 1339 5\' 3"  (1.6 m)     Head Circumference --      Peak Flow  --      Pain Score 04/18/22 1338 10     Pain Loc --      Pain Edu? --      Excl. in [place]? --     Most recent vital signs: Vitals:   04/18/22 1740 04/18/22 1749  BP:  137/86  Pulse: 67 94  Resp:  15  Temp:    SpO2: 100% 99%    Physical Exam Vitals and nursing note reviewed.  Constitutional:      General: Awake and alert. No acute distress.    Appearance: Normal appearance. She is well-developed and obese HENT:     Head: Normocephalic and atraumatic.     Mouth/Throat:     Mouth: Mucous membranes are moist.  Eyes:     General: PERRL. Normal EOMs        Right eye: No discharge.        Left eye: No discharge.     Conjunctiva/sclera: Conjunctivae normal.  Cardiovascular:     Rate and Rhythm: Normal rate and regular rhythm.     Pulses: Normal pulses.     Heart sounds: Normal heart sounds Pulmonary:     Effort: Pulmonary effort is normal. No respiratory distress.     Breath sounds: Normal breath sounds.  Abdominal:     Abdomen is soft. There is no abdominal tenderness. No rebound or guarding. No distention. No CVAT Pelvic: Normal external genitalia.  Scant blood in the vaginal vault, closed os. No clots noted.  No cervical motion tenderness.  No adnexal tenderness or fullness Musculoskeletal:        General: No swelling. Normal range of motion.     Cervical back: Normal range of motion and neck supple.  Skin:    General: Skin is warm and dry.     Capillary Refill: Capillary refill takes less than 2 seconds.     Findings: No rash.  Neurological:     Mental Status: She is alert.      ED Results / Procedures / Treatments   Labs (all labs ordered are listed, but only abnormal results are displayed) Labs Reviewed  URINALYSIS, COMPLETE (UACMP) WITH MICROSCOPIC - Abnormal; Notable for the following components:      Result Value   Color, Urine PINK (*)    APPearance CLOUDY (*)    Hgb urine dipstick LARGE (*)    Protein, ur 100 (*)    Leukocytes,Ua LARGE (*)    RBC / HPF  >50 (*)    WBC, UA >50 (*)    All other components within normal limits  CBC WITH DIFFERENTIAL/PLATELET - Abnormal; Notable for the following components:   WBC 16.3 (*)    Neutro Abs 11.5 (*)    All other components within normal limits  COMPREHENSIVE METABOLIC PANEL - Abnormal; Notable for the following components:   Potassium 3.1 (*)    Glucose, Bld 102 (*)    All other components within normal limits  WET PREP, GENITAL  URINE CULTURE  CHLAMYDIA/NGC RT PCR ([facility] ONLY)            PREGNANCY, URINE  TYPE AND SCREEN  TYPE AND SCREEN     EKG     RADIOLOGY I reviewed the patient's imaging and agree with radiologist finding    PROCEDURES:  Critical Care performed:   Procedures   MEDICATIONS ORDERED IN ED: Medications  cefTRIAXone (ROCEPHIN) 1 g in sodium chloride 0.9 % 100 mL IVPB (0 g Intravenous Stopped 04/18/22 1705)  sodium chloride 0.9 % bolus 1,000 mL (0 mLs Intravenous Stopped 04/18/22 1430)  acetaminophen (TYLENOL) tablet 650 mg (650 mg Oral Given 04/18/22 1439)     IMPRESSION / MDM / ASSESSMENT AND PLAN / ED COURSE  I reviewed the triage vital signs and the nursing notes.   Differential diagnosis includes, but is not limited to, endometrial hyperplasia, endometrial shedding and subsequent bleeding, less likely spontaneous abortion or ectopic given negative pregnancy 2 days ago. No flank pain to suggest nephrolithiasis. No CVAT to suggest pyelonephritis.  Repeat urine pregnancy is negative.  Patient is awake and alert, tachycardic to 115 on arrival and hypertensive.  She is anxious in appearance.  Abdominal exam is nonperitoneal.  I reviewed her previous imaging which demonstrates a thickened endometrium to 3.2 cm.  If she is shedding the endometrial lining, heavy bleeding and blood clots are expected, however we will repeat the ultrasound out of an abundance of caution.  Labs were also obtained to evaluate for possible anemia.  Urinalysis was repeated given her  urine culture obtained 2 days ago which demonstrated numerous organisms, appears to be dirty at this time as well.  Both white blood cells and red blood cells are noted, however no flank pain to suggest nephrolithiasis.  No nephrolithiasis visualized on CAT  scan 2 days ago.  Pelvic exam was performed which demonstrates scant blood in the vaginal vault, no clots.  Blood was cleaned out completely with 2 swabs, no further bleeding noted.  No cervical motion tenderness.  No adnexal fullness or tenderness.  Repeat ultrasound reveals decreased thickness of the endometrial lining, as expected given her bleeding.  I suspect that these findings are consistent with endometrial hyperplasia as expected during her menstrual cycle, and now the endometrial lining is sloughing off as expected.  No evidence of ovarian torsion, no discharge suspicious for infection.  She was treated with IV fluids which resolved her tachycardia.  Patient does not have any urinary symptoms, however her urinalysis does reveal greater than 50 RBCs and greater than 50 WBCs with WBC of 16. Likely contaminant from her vaginal bleeding, however will treat conservatively given her equivocal urine culture obtained 2 days ago and her serum WBC.  She was given a dose of Rocephin in the ER, and her Keflex was changed to [place] given increased community resistance to Keflex.    Patient's H&H is stable from prior.  She has not had any orthostasis. After her initial set of vital signs she remained hemodynamically stable. No indication for blood transfusion. No active bleeding on pelvis exam.  However, I did recommend that the patient follow-up with OB/GYN for consideration of possible endometrial biopsy/higher progesterone treatment if bleeding continues, or other etiologies of her vaginal bleeding and irregular cycles.  We discussed return precautions and the importance of close outpatient follow-up.  Patient and her husband understand and agree with  plan.  She was discharged in stable condition.     FINAL CLINICAL IMPRESSION(S) / ED DIAGNOSES   Final diagnoses:  Vaginal bleeding  Urinary tract infection with hematuria, site unspecified     Rx / DC Orders   ED Discharge Orders          Ordered    cefpodoxime (VANTIN) 100 MG tablet  2 times daily        04/18/22 1750             Note:  This document was prepared using Dragon voice recognition software and may include unintentional dictation errors.   Marquette Old, PA-C 04/18/22 1836    Naaman Plummer, MD 04/18/22 [phone]

## 2022-04-19 LAB — URINE CULTURE: Culture: NO GROWTH

## 2022-04-28 ENCOUNTER — Other Ambulatory Visit: Payer: Self-pay | Admitting: Obstetrics and Gynecology

## 2022-04-29 NOTE — Unmapped (Signed)
[facility] Refill Coordination Note    Specialty Medication(s) to be Shipped:   Inflammatory Disorders: Dupixent    Other medication(s) to be shipped: No additional medications requested for fill at this time     Samantha Miles, DOB: February 15, 1986  Phone: [phone] (home)       All above HIPAA information was verified with patient.     Was a Nurse, learning disability used for this call? No    Completed refill call assessment today to schedule patient's medication shipment from the [facility] [phone]).  All relevant notes have been reviewed.     Specialty medication(s) and dose(s) confirmed: Regimen is correct and unchanged.   Changes to medications: Changed blood pressure medication but could not think of the name  Changes to insurance: No  New side effects reported not previously addressed with a pharmacist or physician: None reported  Questions for the pharmacist: No    Confirmed patient received a Conservation officer, historic buildings and a Surveyor, mining with first shipment. The patient will receive a drug information handout for each medication shipped and additional FDA Medication Guides as required.       DISEASE/MEDICATION-SPECIFIC INFORMATION        For patients on injectable medications: Patient currently has 0 doses left.  Next injection is scheduled for 05/10/2022.    SPECIALTY MEDICATION ADHERENCE     Medication Adherence    Patient reported X missed doses in the last month: 0  Specialty Medication: Dupixent  Patient is on additional specialty medications: No  Any gaps in refill history greater than 2 weeks in the last 3 months: no  Demonstrates understanding of importance of adherence: yes  Informant: patient  Reliability of informant: reliable  Confirmed plan for next specialty medication refill: delivery by pharmacy  Refills needed for supportive medications: not needed              Were doses missed due to medication being on hold? No        REFERRAL TO PHARMACIST     Referral to the pharmacist: Not needed      [facility]     Shipping address confirmed in Epic.     Delivery Scheduled: Yes, Expected medication delivery date: 05/01/2022.     Medication will be delivered via Same Day Courier to the prescription address in Epic WAM.    Samantha Miles D Samantha Miles   [facility] Specialty Technician

## 2022-04-30 NOTE — H&P (Signed)
Ms. Groven is a 36 y.o. female here for TVh and bilateral salpingectomy .pt here for follow up for menorrhagia . Bleeding was somewhat better on seasonale then heavy clotting  Which led her to the ED .  U/s : FINDINGS:  Uterus   Measurements: 9.1 x 4.7 x 7 cm = volume: No focal abnormality is  seen in myometrium. ML. No fibroids or other mass visualized.   Endometrium   Thickness: 2.6 cm. There is interval decrease in endometrial  thickness. There is no increased vascularity in the endometrium.   Right ovary   Not sonographically visualized.   Left ovary   Not sonographically visualized.   Other findings   No abnormal free fluid.       EMBX : neg    Past Medical History:  has a past medical history of Allergic rhinitis (04/03/2014), Allergic state, Depression, Eczema of both hands, unspecified, Hearing deficit, Heel spur (02/23/2014), Kidney stones, Obesity, Plantar fasciitis (02/23/2014), and Renal calculus (08/16/2014).  Past Surgical History:  has a past surgical history that includes Tubal ligation (12/04/2013) and egd (02/12/2022). Family History: family history includes Anxiety in her mother; Depression in her mother; Stroke in her mother. Social History:  reports that she has never smoked. She has never used smokeless tobacco. She reports that she does not drink alcohol and does not use drugs. OB/GYN History:          OB History     Gravida  2   Para  2   Term  2   Preterm      AB      Living  2      SAB      IAB      Ectopic      Molar      Multiple      Live Births  2             Allergies: is allergic to sulfacetamide. Medications:   Current Outpatient Medications:    DUPIXENT SYRINGE 300 mg/2 mL inj syringe, , Disp: , Rfl:    fexofenadine (ALLEGRA) 180 MG tablet, Take by mouth, Disp: , Rfl:    hydroCHLOROthiazide (MICROZIDE) 12.5 mg capsule, , Disp: , Rfl:    levonorgestrel-ethinyl estradiol (SEASONALE) 0.15 mg-30 mcg (91) tablet, Take 1  tablet by mouth once daily, Disp: 84 tablet, Rfl: 3   levonorgestrel-ethinyl estradiol (SEASONALE) 0.15 mg-30 mcg (91) tablet, Take 1 tablet by mouth once daily (Patient not taking: Reported on 04/21/2022), Disp: 84 tablet, Rfl: 3   metFORMIN (GLUCOPHAGE) 500 MG tablet, 1 tab po q hs x 10 hs then increase to 2 tabs po q hs from then on (Patient not taking: Reported on 04/21/2022), Disp: 60 tablet, Rfl: 11   methylPREDNISolone (MEDROL) 4 MG tablet, Day one - 6 tab Day two - 5 tab Day three - 4 tabs Day four - 3 tabs Day five - 2 tabs Day six - 1 tab (Patient not taking: Reported on 02/09/2022), Disp: 21 tablet, Rfl: 0   nortriptyline (PAMELOR) 10 MG capsule, Increase nortriptyline to 30 mg nightly for two weeks, then increase to 40 mg nightly, refilled. (Patient not taking: Reported on 02/09/2022), Disp: 120 capsule, Rfl: 1   pantoprazole (PROTONIX) 40 MG DR tablet, Take 1 tablet (40 mg total) by mouth 2 (two) times daily Take 30 min before meals (Patient not taking: Reported on 02/09/2022), Disp: 60 tablet, Rfl: 3   PARoxetine (PAXIL) 40 MG tablet, , Disp: , Rfl:  phentermine (ADIPEX-P) 30 MG capsule, Take 30 mg by mouth every morning before breakfast (Patient not taking: Reported on 04/21/2022), Disp: , Rfl:    SUMAtriptan (IMITREX) 100 MG tablet, Take 1/2-1 tab at headache onset, can repeat once in 2 hours if needed. No more than 2 doses in 24 hours (Patient not taking: Reported on 02/09/2022), Disp: 9 tablet, Rfl: 6   VICTOZA 3-PAK 0.6 mg/0.1 mL (18 mg/3 mL) pen injector, , Disp: , Rfl:    Review of Systems: General:                      No fatigue or weight loss Eyes:                           No vision changes Ears:                            No hearing difficulty Respiratory:                No cough or shortness of breath Pulmonary:                  No asthma or shortness of breath Cardiovascular:           No chest pain, palpitations, dyspnea on exertion Gastrointestinal:          No abdominal  bloating, chronic diarrhea, constipations, masses, pain or hematochezia Genitourinary:             No hematuria, dysuria, abnormal vaginal discharge, pelvic pain, Menometrorrhagia Lymphatic:                   No swollen lymph nodes Musculoskeletal:         No muscle weakness Neurologic:                  No extremity weakness, syncope, seizure disorder Psychiatric:                  No history of depression, delusions or suicidal/homicidal ideation      Exam:       Vitals:    04/21/22 1355  BP: (!) 149/97  Pulse: (!) 112      Body mass index is 43.75 kg/m.   WDWN white/ female in NAD   Lungs: CTA  CV : RRR without murmur     Neck:  no thyromegaly Abdomen: soft , no mass, normal active bowel sounds,  non-tender, no rebound tenderness Pelvic: tanner stage 5 ,  External genitalia: vulva /labia no lesions Urethra: no prolapse Vagina: normal physiologic d/c Cervix: no lesions, no cervical motion tenderness   Uterus: normal size shape and contour, non-tender Adnexa: no mass,  non-tender   Rectovaginal:    Impression:    The primary encounter diagnosis was Abnormal uterine bleeding (AUB). A diagnosis of Morbid obesity with BMI of 45.0-49.9, adult (CMS-HCC) was also pertinent to this visit. Bleeding not improving on OCPs      Plan:    Spoke to the patient about Mirena IUD  Vs endometrial ablation  Vs TVH and bilateral salpingectomy . After consideration she has elected for [facility] , BS Preop will be set up  Benefits and risks to surgery: The proposed benefit of the surgery has been discussed with the patient. The possible risks include, but are not limited to: organ injury to the bowel , bladder, ureters, and  major blood vessels and nerves. There is a possibility of additional surgeries resulting from these injuries. There is also the risk of blood transfusion and the need to receive blood products during or after the procedure which may rarely lead to HIV or Hepatitis C infection.  There is a risk of developing a deep venous thrombosis or a pulmonary embolism . There is the possibility of wound infection and also anesthetic complications, even the rare possibility of death. The patient understands these risks and wishes to proceed. All questions have been answered       No follow-ups on file.   Caroline Sauger, MD

## 2022-05-01 MED FILL — DUPIXENT 300 MG/2 ML SUBCUTANEOUS PEN INJECTOR: SUBCUTANEOUS | 28 days supply | Qty: 4 | Fill #3

## 2022-05-13 ENCOUNTER — Encounter
Admission: RE | Admit: 2022-05-13 | Discharge: 2022-05-13 | Disposition: A | Discharge status: Home / Self Care | Payer: Medicare HMO | Source: Ambulatory Visit | Attending: Obstetrics and Gynecology | Admitting: Obstetrics and Gynecology

## 2022-05-13 HISTORY — DX: Personal history of urinary calculi: Z87.442

## 2022-05-13 HISTORY — DX: Gastro-esophageal reflux disease without esophagitis: K21.9

## 2022-05-13 NOTE — Patient Instructions (Addendum)
Your procedure is scheduled on:05-21-22 Thursday Report to the Registration Desk on the 1st floor of the Medical Mall.Then proceed to the 2nd floor Surgery Desk To find out your arrival time, please call [phone] between 1PM - 3PM on:05-20-22 Wednesday If your arrival time is 6:00 am, do not arrive prior to that time as the Medical Mall entrance doors do not open until 6:00 am.  REMEMBER: Instructions that are not followed completely may result in serious medical risk, up to and including death; or upon the discretion of your surgeon and anesthesiologist your surgery may need to be rescheduled.  Do not eat food after midnight the night before surgery.  No gum chewing, lozengers or hard candies.  You may however, drink CLEAR liquids up to 2 hours before you are scheduled to arrive for your surgery. Do not drink anything within 2 hours of your scheduled arrival time.  Clear liquids include: - water  - apple juice without pulp - gatorade (not RED colors) - black coffee or tea (Do NOT add milk or creamers to the coffee or tea) Do NOT drink anything that is not on this list.  In addition, your doctor has ordered for you to drink the provided  Ensure Pre-Surgery Clear Carbohydrate Drink  Drinking this carbohydrate drink up to two hours before surgery helps to reduce insulin resistance and improve patient outcomes. Please complete drinking 2 hours prior to scheduled arrival time.  TAKE THESE MEDICATIONS THE MORNING OF SURGERY WITH A SIP OF WATER: -famotidine (PEPCID)  -fexofenadine (ALLEGRA)  -pantoprazole (PROTONIX)   Stop your Phentermine now (05-13-22)-you may resume after surgery  One week prior to surgery: Stop Anti-inflammatories (NSAIDS) such as Advil, Aleve, Ibuprofen, Motrin, Naproxen, Naprosyn and Aspirin based products such as Excedrin, Goodys Powder, BC Powder.You may however, take Tylenol if needed for pain up until the day of surgery. Stop ANY OVER THE COUNTER  supplements/vitamins NOW (05-13-22) until after surgery.  No Alcohol for 24 hours before or after surgery.  No Smoking including e-cigarettes for 24 hours prior to surgery.  No chewable tobacco products for at least 6 hours prior to surgery.  No nicotine patches on the day of surgery.  Do not use any "recreational" drugs for at least a week prior to your surgery.  Please be advised that the combination of cocaine and anesthesia may have negative outcomes, up to and including death. If you test positive for cocaine, your surgery will be cancelled.  On the morning of surgery brush your teeth with toothpaste and water, you may rinse your mouth with mouthwash if you wish. Do not swallow any toothpaste or mouthwash.  Do not wear jewelry, make-up, hairpins, clips or nail polish.  Do not wear lotions, powders, or perfumes.   Do not shave body from the neck down 48 hours prior to surgery just in case you cut yourself which could leave a site for infection.  Also, freshly shaved skin may become irritated if using the CHG soap.  Contact lenses, hearing aids and dentures may not be worn into surgery.  Do not bring valuables to the hospital. [facility] is not responsible for any missing/lost belongings or valuables.   Notify your doctor if there is any change in your medical condition (cold, fever, infection).  Wear comfortable clothing (specific to your surgery type) to the hospital.  After surgery, you can help prevent lung complications by doing breathing exercises.  Take deep breaths and cough every 1-2 hours. Your doctor may order a  device called an Incentive Spirometer to help you take deep breaths. When coughing or sneezing, hold a pillow firmly against your incision with both hands. This is called "splinting." Doing this helps protect your incision. It also decreases belly discomfort.  If you are being admitted to the hospital overnight, leave your suitcase in the car. After surgery it  may be brought to your room.  If you are being discharged the day of surgery, you will not be allowed to drive home. You will need a responsible adult (18 years or older) to drive you home and stay with you that night.   If you are taking public transportation, you will need to have a responsible adult (18 years or older) with you. Please confirm with your physician that it is acceptable to use public transportation.   Please call the Pre-admissions Testing Dept. at [phone] if you have any questions about these instructions.  Surgery Visitation Policy:  Patients undergoing a surgery or procedure may have two family members or support persons with them as long as the person is not COVID-19 positive or experiencing its symptoms.

## 2022-05-18 ENCOUNTER — Encounter
Admission: RE | Admit: 2022-05-18 | Discharge: 2022-05-18 | Disposition: A | Discharge status: Home / Self Care | Payer: Medicare HMO | Source: Ambulatory Visit | Attending: Obstetrics and Gynecology | Admitting: Obstetrics and Gynecology

## 2022-05-18 DIAGNOSIS — Z01812 Encounter for preprocedural laboratory examination: Secondary | ICD-10-CM | POA: Diagnosis present

## 2022-05-18 DIAGNOSIS — Z01818 Encounter for other preprocedural examination: Secondary | ICD-10-CM | POA: Insufficient documentation

## 2022-05-18 LAB — CBC
HCT: 43.8 % (ref 36.0–46.0)
Hemoglobin: 14.4 g/dL (ref 12.0–15.0)
MCH: 29.1 pg (ref 26.0–34.0)
MCHC: 32.9 g/dL (ref 30.0–36.0)
MCV: 88.5 fL (ref 80.0–100.0)
Platelets: 345 10*3/uL (ref 150–400)
RBC: 4.95 MIL/uL (ref 3.87–5.11)
RDW: 13 % (ref 11.5–15.5)
WBC: 8.9 10*3/uL (ref 4.0–10.5)
nRBC: 0 % (ref 0.0–0.2)

## 2022-05-18 LAB — BASIC METABOLIC PANEL
Anion gap: 11 (ref 5–15)
BUN: 9 mg/dL (ref 6–20)
CO2: 22 mmol/L (ref 22–32)
Calcium: 9.4 mg/dL (ref 8.9–10.3)
Chloride: 107 mmol/L (ref 98–111)
Creatinine, Ser: 0.81 mg/dL (ref 0.44–1.00)
GFR, Estimated: >60 mL/min (ref >60)
Glucose, Bld: 119 mg/dL — ABNORMAL HIGH (ref 70–99)
Potassium: 3.5 mmol/L (ref 3.5–5.1)
Sodium: 140 mmol/L (ref 135–145)

## 2022-05-18 LAB — TYPE AND SCREEN
ABO/RH(D): O POS
Antibody Screen: NEGATIVE

## 2022-05-20 MED ORDER — LACTATED RINGERS IV SOLN: INTRAVENOUS | Status: DC

## 2022-05-20 MED ORDER — CEFAZOLIN SODIUM-DEXTROSE 2-4 GM/100ML-% IV SOLN
2.0000 g | Freq: Once | INTRAVENOUS | Status: AC
  Administered 2022-05-21: 2 g via INTRAVENOUS

## 2022-05-20 MED ORDER — GABAPENTIN 300 MG PO CAPS: 300.0000 mg | ORAL_CAPSULE | ORAL | Status: AC

## 2022-05-20 MED ORDER — POVIDONE-IODINE 10 % EX SWAB: 1.0000 | Freq: Once | CUTANEOUS | Status: DC

## 2022-05-20 MED ORDER — ORAL CARE MOUTH RINSE: 15.0000 mL | Freq: Once | OROMUCOSAL | Status: AC

## 2022-05-20 MED ORDER — ACETAMINOPHEN 500 MG PO TABS: 1000.0000 mg | ORAL_TABLET | ORAL | Status: AC

## 2022-05-20 MED ORDER — POVIDONE-IODINE 10 % EX SWAB: 2.0000 "application " | Freq: Once | CUTANEOUS | Status: DC

## 2022-05-20 MED ORDER — CHLORHEXIDINE GLUCONATE 0.12 % MT SOLN: 15.0000 mL | Freq: Once | OROMUCOSAL | Status: AC

## 2022-05-21 ENCOUNTER — Ambulatory Visit: Payer: Medicare HMO | Admitting: Urgent Care

## 2022-05-21 ENCOUNTER — Encounter
Admission: RE | Disposition: A | Discharge status: Home / Self Care | Payer: Self-pay | Source: Home / Self Care | Attending: Obstetrics and Gynecology

## 2022-05-21 ENCOUNTER — Other Ambulatory Visit: Payer: Self-pay

## 2022-05-21 ENCOUNTER — Ambulatory Visit
Admission: RE | Admit: 2022-05-21 | Discharge: 2022-05-21 | Disposition: A | Discharge status: Home / Self Care | Payer: Medicare HMO | Attending: Obstetrics and Gynecology | Admitting: Obstetrics and Gynecology

## 2022-05-21 ENCOUNTER — Ambulatory Visit: Payer: Medicare HMO | Admitting: Anesthesiology

## 2022-05-21 ENCOUNTER — Encounter: Payer: Self-pay | Admitting: Obstetrics and Gynecology

## 2022-05-21 DIAGNOSIS — I10 Essential (primary) hypertension: Secondary | ICD-10-CM | POA: Insufficient documentation

## 2022-05-21 DIAGNOSIS — Z79899 Other long term (current) drug therapy: Secondary | ICD-10-CM | POA: Insufficient documentation

## 2022-05-21 DIAGNOSIS — N920 Excessive and frequent menstruation with regular cycle: Secondary | ICD-10-CM | POA: Diagnosis not present

## 2022-05-21 DIAGNOSIS — Z6841 Body Mass Index (BMI) 40.0 and over, adult: Secondary | ICD-10-CM | POA: Diagnosis not present

## 2022-05-21 DIAGNOSIS — Z01818 Encounter for other preprocedural examination: Secondary | ICD-10-CM

## 2022-05-21 DIAGNOSIS — F32A Depression, unspecified: Secondary | ICD-10-CM | POA: Diagnosis not present

## 2022-05-21 DIAGNOSIS — K219 Gastro-esophageal reflux disease without esophagitis: Secondary | ICD-10-CM | POA: Insufficient documentation

## 2022-05-21 HISTORY — PX: VAGINAL HYSTERECTOMY: SHX2639

## 2022-05-21 LAB — ABO/RH: ABO/RH(D): O POS

## 2022-05-21 LAB — POCT PREGNANCY, URINE: Preg Test, Ur: NEGATIVE

## 2022-05-21 SURGERY — HYSTERECTOMY, VAGINAL
Anesthesia: General | Site: Vagina

## 2022-05-21 MED ORDER — KETAMINE HCL 10 MG/ML IJ SOLN
INTRAMUSCULAR | Status: DC | PRN
  Administered 2022-05-21: 20 mg via INTRAVENOUS
  Administered 2022-05-21: 30 mg via INTRAVENOUS

## 2022-05-21 MED ORDER — LIDOCAINE-EPINEPHRINE 1 %-1:100000 IJ SOLN
INTRAMUSCULAR | Status: AC
  Filled 2022-05-21: qty 1

## 2022-05-21 MED ORDER — 0.9 % SODIUM CHLORIDE (POUR BTL) OPTIME
TOPICAL | Status: DC | PRN
  Administered 2022-05-21: 500 mL

## 2022-05-21 MED ORDER — CEFAZOLIN SODIUM-DEXTROSE 2-4 GM/100ML-% IV SOLN
INTRAVENOUS | Status: AC
  Filled 2022-05-21: qty 100

## 2022-05-21 MED ORDER — PROPOFOL 10 MG/ML IV BOLUS
INTRAVENOUS | Status: AC
  Filled 2022-05-21: qty 20

## 2022-05-21 MED ORDER — ONDANSETRON HCL 4 MG/2ML IJ SOLN: 4.0000 mg | Freq: Once | INTRAMUSCULAR | Status: DC | PRN

## 2022-05-21 MED ORDER — LIDOCAINE HCL (CARDIAC) PF 100 MG/5ML IV SOSY
PREFILLED_SYRINGE | INTRAVENOUS | Status: DC | PRN
  Administered 2022-05-21: 100 mg via INTRAVENOUS

## 2022-05-21 MED ORDER — FENTANYL CITRATE (PF) 100 MCG/2ML IJ SOLN
INTRAMUSCULAR | Status: AC
  Administered 2022-05-21: 25 ug via INTRAVENOUS
  Filled 2022-05-21: qty 2

## 2022-05-21 MED ORDER — MIDAZOLAM HCL 2 MG/2ML IJ SOLN
INTRAMUSCULAR | Status: DC | PRN
  Administered 2022-05-21: 2 mg via INTRAVENOUS

## 2022-05-21 MED ORDER — MIDAZOLAM HCL 2 MG/2ML IJ SOLN
INTRAMUSCULAR | Status: AC
  Filled 2022-05-21: qty 2

## 2022-05-21 MED ORDER — ACETAMINOPHEN 500 MG PO TABS
ORAL_TABLET | ORAL | Status: AC
  Administered 2022-05-21: 1000 mg via ORAL
  Filled 2022-05-21: qty 2

## 2022-05-21 MED ORDER — ONDANSETRON HCL 4 MG/2ML IJ SOLN
INTRAMUSCULAR | Status: DC | PRN
  Administered 2022-05-21: 4 mg via INTRAVENOUS

## 2022-05-21 MED ORDER — LIDOCAINE-EPINEPHRINE 1 %-1:100000 IJ SOLN
INTRAMUSCULAR | Status: DC | PRN
  Administered 2022-05-21: 7 mL

## 2022-05-21 MED ORDER — FENTANYL CITRATE (PF) 100 MCG/2ML IJ SOLN
INTRAMUSCULAR | Status: AC
  Filled 2022-05-21: qty 2

## 2022-05-21 MED ORDER — SUGAMMADEX SODIUM 200 MG/2ML IV SOLN
INTRAVENOUS | Status: DC | PRN
  Administered 2022-05-21: 200 mg via INTRAVENOUS

## 2022-05-21 MED ORDER — FENTANYL CITRATE (PF) 100 MCG/2ML IJ SOLN
INTRAMUSCULAR | Status: DC | PRN
  Administered 2022-05-21 (×4): 50 ug via INTRAVENOUS

## 2022-05-21 MED ORDER — OXYCODONE HCL 5 MG PO TABS: 5.0000 mg | ORAL_TABLET | Freq: Once | ORAL | Status: AC

## 2022-05-21 MED ORDER — GABAPENTIN 300 MG PO CAPS
ORAL_CAPSULE | ORAL | Status: AC
  Administered 2022-05-21: 300 mg via ORAL
  Filled 2022-05-21: qty 1

## 2022-05-21 MED ORDER — OXYCODONE HCL 5 MG PO TABS
ORAL_TABLET | ORAL | Status: AC
  Administered 2022-05-21: 5 mg via ORAL
  Filled 2022-05-21: qty 1

## 2022-05-21 MED ORDER — ROCURONIUM BROMIDE 100 MG/10ML IV SOLN
INTRAVENOUS | Status: DC | PRN
  Administered 2022-05-21: 50 mg via INTRAVENOUS

## 2022-05-21 MED ORDER — DEXMEDETOMIDINE (PRECEDEX) IN NS 20 MCG/5ML (4 MCG/ML) IV SYRINGE
PREFILLED_SYRINGE | INTRAVENOUS | Status: DC | PRN
  Administered 2022-05-21: 8 ug via INTRAVENOUS
  Administered 2022-05-21: 4 ug via INTRAVENOUS
  Administered 2022-05-21 (×2): 8 ug via INTRAVENOUS

## 2022-05-21 MED ORDER — PROPOFOL 10 MG/ML IV BOLUS
INTRAVENOUS | Status: DC | PRN
  Administered 2022-05-21: 150 mg via INTRAVENOUS

## 2022-05-21 MED ORDER — ONDANSETRON HCL 4 MG PO TABS: 8.0000 mg | ORAL_TABLET | Freq: Once | ORAL | Status: DC

## 2022-05-21 MED ORDER — KETOROLAC TROMETHAMINE 30 MG/ML IJ SOLN
INTRAMUSCULAR | Status: DC | PRN
  Administered 2022-05-21: 30 mg via INTRAVENOUS

## 2022-05-21 MED ORDER — FENTANYL CITRATE (PF) 100 MCG/2ML IJ SOLN
25.0000 ug | INTRAMUSCULAR | Status: DC | PRN
  Administered 2022-05-21 (×2): 25 ug via INTRAVENOUS

## 2022-05-21 MED ORDER — DEXAMETHASONE SODIUM PHOSPHATE 10 MG/ML IJ SOLN
INTRAMUSCULAR | Status: DC | PRN
  Administered 2022-05-21: 10 mg via INTRAVENOUS

## 2022-05-21 MED ORDER — OXYCODONE HCL 5 MG PO TABS: 5.0000 mg | ORAL_TABLET | ORAL | Status: DC | PRN

## 2022-05-21 MED ORDER — KETAMINE HCL 50 MG/5ML IJ SOSY
PREFILLED_SYRINGE | INTRAMUSCULAR | Status: AC
  Filled 2022-05-21: qty 5

## 2022-05-21 MED ORDER — CHLORHEXIDINE GLUCONATE 0.12 % MT SOLN
OROMUCOSAL | Status: AC
  Administered 2022-05-21: 15 mL via OROMUCOSAL
  Filled 2022-05-21: qty 15

## 2022-05-21 SURGICAL SUPPLY — 38 items
BACTOSHIELD CHG 4% 4OZ (MISCELLANEOUS) ×1
CATH FOLEY 2WAY  5CC 16FR (CATHETERS) ×1
CATH FOLEY 2WAY 5CC 16FR (CATHETERS) ×2
CATH URTH 16FR FL 2W BLN LF (CATHETERS) ×2 IMPLANT
DRAPE PERI LITHO V/GYN (MISCELLANEOUS) ×3 IMPLANT
DRAPE SURG 17X11 SM STRL (DRAPES) ×3 IMPLANT
DRAPE UNDER BUTTOCK W/FLU (DRAPES) ×3 IMPLANT
ELECT REM PT RETURN 9FT ADLT (ELECTROSURGICAL) ×3
ELECTRODE REM PT RTRN 9FT ADLT (ELECTROSURGICAL) ×2 IMPLANT
GAUZE 4X4 16PLY ~~LOC~~+RFID DBL (SPONGE) ×6 IMPLANT
GLOVE SURG SYN 8.0 (GLOVE) ×3 IMPLANT
GLOVE SURG SYN 8.0 PF PI (GLOVE) ×1 IMPLANT
GOWN STRL REUS W/ TWL LRG LVL3 (GOWN DISPOSABLE) ×6 IMPLANT
GOWN STRL REUS W/ TWL XL LVL3 (GOWN DISPOSABLE) ×2 IMPLANT
GOWN STRL REUS W/TWL LRG LVL3 (GOWN DISPOSABLE) ×9
GOWN STRL REUS W/TWL XL LVL3 (GOWN DISPOSABLE) ×3
KIT TURNOVER CYSTO (KITS) ×3 IMPLANT
LABEL OR SOLS (LABEL) ×3 IMPLANT
MANIFOLD NEPTUNE II (INSTRUMENTS) ×3 IMPLANT
NEEDLE HYPO 22GX1.5 SAFETY (NEEDLE) ×3 IMPLANT
NS IRRIG 500ML POUR BTL (IV SOLUTION) ×2 IMPLANT
PACK BASIN MINOR ARMC (MISCELLANEOUS) ×3 IMPLANT
PAD OB MATERNITY 4.3X12.25 (PERSONAL CARE ITEMS) ×3 IMPLANT
PAD PREP 24X41 OB/GYN DISP (PERSONAL CARE ITEMS) ×3 IMPLANT
SCRUB CHG 4% DYNA-HEX 4OZ (MISCELLANEOUS) ×2 IMPLANT
SOL PREP PVP 2OZ (MISCELLANEOUS) ×3
SOL SCRUB PVP POV-IOD 4OZ 7.5% (MISCELLANEOUS) ×3
SOLUTION PREP PVP 2OZ (MISCELLANEOUS) ×2 IMPLANT
SOLUTION SCRB POV-IOD 4OZ 7.5% (MISCELLANEOUS) ×2 IMPLANT
SURGILUBE 2OZ TUBE FLIPTOP (MISCELLANEOUS) ×3 IMPLANT
SUT PDS 2-0 27IN (SUTURE) IMPLANT
SUT VIC AB 0 CT1 27 (SUTURE) ×6
SUT VIC AB 0 CT1 27XCR 8 STRN (SUTURE) ×4 IMPLANT
SUT VIC AB 0 CT1 36 (SUTURE) ×3 IMPLANT
SUT VIC AB 2-0 SH 27 (SUTURE) ×3
SUT VIC AB 2-0 SH 27XBRD (SUTURE) ×2 IMPLANT
SYR 10ML LL (SYRINGE) ×3 IMPLANT
SYR CONTROL 10ML LL (SYRINGE) ×3 IMPLANT

## 2022-05-21 NOTE — Anesthesia Procedure Notes (Signed)
Procedure Name: Intubation Date/Time: 05/21/2022 7:37 AM  Performed by: Cheral Bay, CRNAPre-anesthesia Checklist: Patient identified, Emergency Drugs available, Suction available and Patient being monitored Patient Re-evaluated:Patient Re-evaluated prior to induction Oxygen Delivery Method: Circle system utilized Preoxygenation: Pre-oxygenation with 100% oxygen Induction Type: IV induction Ventilation: Mask ventilation without difficulty Laryngoscope Size: McGraph and 3 Grade View: Grade I Tube type: Oral Tube size: 7.0 mm Number of attempts: 1 Airway Equipment and Method: Stylet Placement Confirmation: ETT inserted through vocal cords under direct vision, positive ETCO2 and breath sounds checked- equal and bilateral Secured at: 21 cm Tube secured with: Tape Dental Injury: Teeth and Oropharynx as per pre-operative assessment

## 2022-05-21 NOTE — Discharge Instructions (Signed)
AMBULATORY SURGERY  ?DISCHARGE INSTRUCTIONS ? ? ?The drugs that you were given will stay in your system until tomorrow so for the next 24 hours you should not: ? ?Drive an automobile ?Make any legal decisions ?Drink any alcoholic beverage ? ? ?You may resume regular meals tomorrow.  Today it is better to start with liquids and gradually work up to solid foods. ? ?You may eat anything you prefer, but it is better to start with liquids, then soup and crackers, and gradually work up to solid foods. ? ? ?Please notify your doctor immediately if you have any unusual bleeding, trouble breathing, redness and pain at the surgery site, drainage, fever, or pain not relieved by medication. ? ? ? ?Additional Instructions: ? ? ? ?Please contact your physician with any problems or Same Day Surgery at [phone], Monday through Friday 6 am to 4 pm, or [place] [facility] [place] Main number at [phone].  ?

## 2022-05-21 NOTE — Transfer of Care (Signed)
Immediate Anesthesia Transfer of Care Note  Patient: Brittney Sanders  Procedure(s) Performed: HYSTERECTOMY VAGINAL (Vagina )  Patient Location: PACU  Anesthesia Type:General  Level of Consciousness: drowsy  Airway & Oxygen Therapy: Patient Spontanous Breathing and Patient connected to face mask oxygen  Post-op Assessment: Report given to RN and Post -op Vital signs reviewed and stable  Post vital signs: Reviewed and stable  Last Vitals:  Vitals Value Taken Time  BP 99/61 05/21/22 0918  Temp    Pulse 85 05/21/22 0922  Resp 19 05/21/22 0922  SpO2 98 % 05/21/22 0922  Vitals shown include unvalidated device data.  Last Pain:  Vitals:   05/21/22 0627  TempSrc: Temporal  PainSc: 0-No pain      Patients Stated Pain Goal: 0 (05/21/22 8185)  Complications: No notable events documented.

## 2022-05-21 NOTE — Anesthesia Preprocedure Evaluation (Signed)
Anesthesia Evaluation  Patient identified by MRN, date of birth, ID band Patient awake    Reviewed: Allergy & Precautions, NPO status , Patient's Chart, lab work & pertinent test results  Airway Mallampati: II  TM Distance: >3 FB Neck ROM: Full    Dental  (+) Teeth Intact   Pulmonary neg pulmonary ROS,    Pulmonary exam normal breath sounds clear to auscultation       Cardiovascular Exercise Tolerance: Good hypertension, Pt. on medications negative cardio ROS Normal cardiovascular exam Rhythm:Regular Rate:Normal     Neuro/Psych negative neurological ROS  negative psych ROS   GI/Hepatic negative GI ROS, Neg liver ROS, GERD  Medicated,  Endo/Other  negative endocrine ROSMorbid obesity  Renal/GU      Musculoskeletal negative musculoskeletal ROS (+)   Abdominal (+) + obese,   Peds negative pediatric ROS (+)  Hematology negative hematology ROS (+)   Anesthesia Other Findings Past Medical History: No date: Depression No date: Eczema No date: GERD (gastroesophageal reflux disease) No date: Hearing deficit No date: Heel spur No date: History of kidney stones 2021: History of methicillin resistant staphylococcus aureus (MRSA) No date: Hypertension No date: Nocturia No date: Obesity No date: Overweight No date: Plantar fasciitis No date: Seasonal allergies No date: UTI (lower urinary tract infection)  Past Surgical History: 02/12/2022: ESOPHAGOGASTRODUODENOSCOPY (EGD) WITH PROPOFOL; N/A     Comment:  Procedure: ESOPHAGOGASTRODUODENOSCOPY (EGD) WITH               PROPOFOL;  Surgeon: Jaynie Collins, DO;  Location:              [facility] ENDOSCOPY;  Service: Gastroenterology;  Laterality:               N/A; No date: TUBAL LIGATION No date: TYMPANOSTOMY TUBE PLACEMENT; Right  BMI    Body Mass Index: 43.93 kg/m      Reproductive/Obstetrics negative OB ROS                              Anesthesia Physical Anesthesia Plan  ASA: 3  Anesthesia Plan: General   Post-op Pain Management:    Induction: Intravenous  PONV Risk Score and Plan: 1 and Ondansetron and Dexamethasone  Airway Management Planned: Oral ETT  Additional Equipment:   Intra-op Plan:   Post-operative Plan: Extubation in OR  Informed Consent: I have reviewed the patients History and Physical, chart, labs and discussed the procedure including the risks, benefits and alternatives for the proposed anesthesia with the patient or authorized representative who has indicated his/her understanding and acceptance.     Dental Advisory Given  Plan Discussed with: CRNA and Surgeon  Anesthesia Plan Comments:         Anesthesia Quick Evaluation

## 2022-05-21 NOTE — Brief Op Note (Signed)
05/21/2022  9:08 AM  PATIENT:  Brittney Sanders  36 y.o. female  PRE-OPERATIVE DIAGNOSIS:  menorrhagia  POST-OPERATIVE DIAGNOSIS:  menorrhagia  PROCEDURE:  Procedure(s): HYSTERECTOMY VAGINAL (N/A)  SURGEON:  Surgeon(s) and Role:    * Jean Alejos, Ihor Austin, MD - Primary    * Christeen Douglas, MD - Assisting  PHYSICIAN ASSISTANT: PA Student [place]  ASSISTANTS: none   ANESTHESIA:   general  EBL:  70 mL   BLOOD ADMINISTERED:none  DRAINS: none   LOCAL MEDICATIONS USED:  LIDOCAINE   SPECIMEN:  cervix , uterus  DISPOSITION OF SPECIMEN:  PATHOLOGY  COUNTS:  YES  TOURNIQUET:  * No tourniquets in log *  DICTATION: .Other Dictation: Dictation Number verbal  PLAN OF CARE: Discharge to home after PACU  PATIENT DISPOSITION:  PACU - hemodynamically stable.   Delay start of Pharmacological VTE agent (>24hrs) due to surgical blood loss or risk of bleeding: not applicable

## 2022-05-21 NOTE — Anesthesia Postprocedure Evaluation (Signed)
Anesthesia Post Note  Patient: Brittney Sanders  Procedure(s) Performed: HYSTERECTOMY VAGINAL (Vagina )  Patient location during evaluation: PACU Anesthesia Type: General Level of consciousness: awake and oriented Pain management: satisfactory to patient Vital Signs Assessment: post-procedure vital signs reviewed and stable Respiratory status: spontaneous breathing and respiratory function stable Cardiovascular status: stable Anesthetic complications: no   No notable events documented.   Last Vitals:  Vitals:   05/21/22 0627 05/21/22 0919  BP: (!) 136/95 99/61  Pulse: 97 88  Resp: 16 20  Temp: 36.8 C (!) 36.2 C  SpO2: 100% 91%    Last Pain:  Vitals:   05/21/22 0919  TempSrc:   PainSc: Asleep                 VAN STAVEREN,Donterius Filley

## 2022-05-22 ENCOUNTER — Encounter: Payer: Self-pay | Admitting: Obstetrics and Gynecology

## 2022-05-22 LAB — SURGICAL PATHOLOGY

## 2022-05-27 NOTE — Unmapped (Signed)
[facility] Refill Coordination Note    Specialty Medication(s) to be Shipped:   Inflammatory Disorders: Dupixent    Other medication(s) to be shipped: No additional medications requested for fill at this time     Samantha Miles, DOB: 03/26/1986  Phone: [phone] (home)       All above HIPAA information was verified with patient.     Was a Nurse, learning disability used for this call? No    Completed refill call assessment today to schedule patient's medication shipment from the [facility] [phone]).  All relevant notes have been reviewed.     Specialty medication(s) and dose(s) confirmed: Regimen is correct and unchanged.   Changes to medications: Samantha Miles reports no changes at this time.  Changes to insurance: No  New side effects reported not previously addressed with a pharmacist or physician: None reported  Questions for the pharmacist: No    Confirmed patient received a Conservation officer, historic buildings and a Surveyor, mining with first shipment. The patient will receive a drug information handout for each medication shipped and additional FDA Medication Guides as required.       DISEASE/MEDICATION-SPECIFIC INFORMATION        For patients on injectable medications: Patient currently has 0 doses left.  Next injection is scheduled for 06/07/2022.    SPECIALTY MEDICATION ADHERENCE     Medication Adherence    Patient reported X missed doses in the last month: 0  Specialty Medication: Dupixent  Patient is on additional specialty medications: No  Any gaps in refill history greater than 2 weeks in the last 3 months: no  Demonstrates understanding of importance of adherence: yes  Informant: patient  Reliability of informant: reliable  Confirmed plan for next specialty medication refill: delivery by pharmacy  Refills needed for supportive medications: not needed              Were doses missed due to medication being on hold? No        REFERRAL TO PHARMACIST     Referral to the pharmacist: Not needed      [facility]     Shipping address confirmed in Epic.     Delivery Scheduled: Yes, Expected medication delivery date: 05/29/2022.     Medication will be delivered via Same Day Courier to the prescription address in Epic WAM.    Samantha Miles   [facility] Specialty Technician

## 2022-05-29 MED FILL — DUPIXENT 300 MG/2 ML SUBCUTANEOUS PEN INJECTOR: SUBCUTANEOUS | 28 days supply | Qty: 4 | Fill #4

## 2022-06-14 ENCOUNTER — Other Ambulatory Visit: Payer: Self-pay

## 2022-06-14 ENCOUNTER — Emergency Department
Admission: EM | Admit: 2022-06-14 | Discharge: 2022-06-14 | Disposition: A | Discharge status: Home / Self Care | Payer: Medicare HMO | Attending: Emergency Medicine | Admitting: Emergency Medicine

## 2022-06-14 ENCOUNTER — Emergency Department: Payer: Medicare HMO

## 2022-06-14 DIAGNOSIS — R1032 Left lower quadrant pain: Secondary | ICD-10-CM | POA: Diagnosis present

## 2022-06-14 DIAGNOSIS — N200 Calculus of kidney: Secondary | ICD-10-CM | POA: Diagnosis not present

## 2022-06-14 DIAGNOSIS — N390 Urinary tract infection, site not specified: Secondary | ICD-10-CM | POA: Diagnosis not present

## 2022-06-14 LAB — COMPREHENSIVE METABOLIC PANEL
ALT: 32 U/L (ref 0–44)
AST: 29 U/L (ref 15–41)
Albumin: 3.8 g/dL (ref 3.5–5.0)
Alkaline Phosphatase: 59 U/L (ref 38–126)
Anion gap: 6 (ref 5–15)
BUN: 13 mg/dL (ref 6–20)
CO2: 26 mmol/L (ref 22–32)
Calcium: 9.2 mg/dL (ref 8.9–10.3)
Chloride: 108 mmol/L (ref 98–111)
Creatinine, Ser: 0.81 mg/dL (ref 0.44–1.00)
GFR, Estimated: >60 mL/min (ref >60)
Glucose, Bld: 101 mg/dL — ABNORMAL HIGH (ref 70–99)
Potassium: 3.9 mmol/L (ref 3.5–5.1)
Sodium: 140 mmol/L (ref 135–145)
Total Bilirubin: 0.7 mg/dL (ref 0.3–1.2)
Total Protein: 6.6 g/dL (ref 6.5–8.1)

## 2022-06-14 LAB — CBC
HCT: 41.3 % (ref 36.0–46.0)
Hemoglobin: 13.4 g/dL (ref 12.0–15.0)
MCH: 28.8 pg (ref 26.0–34.0)
MCHC: 32.4 g/dL (ref 30.0–36.0)
MCV: 88.6 fL (ref 80.0–100.0)
Platelets: 361 10*3/uL (ref 150–400)
RBC: 4.66 MIL/uL (ref 3.87–5.11)
RDW: 13.3 % (ref 11.5–15.5)
WBC: 9.3 10*3/uL (ref 4.0–10.5)
nRBC: 0 % (ref 0.0–0.2)

## 2022-06-14 LAB — URINALYSIS, ROUTINE W REFLEX MICROSCOPIC
Bilirubin Urine: NEGATIVE
Glucose, UA: NEGATIVE mg/dL
Hgb urine dipstick: NEGATIVE
Ketones, ur: NEGATIVE mg/dL
Nitrite: NEGATIVE
Protein, ur: NEGATIVE mg/dL
Specific Gravity, Urine: 1.02 (ref 1.005–1.030)
pH: 6 (ref 5.0–8.0)

## 2022-06-14 LAB — LIPASE, BLOOD: Lipase: 27 U/L (ref 11–51)

## 2022-06-14 LAB — POC URINE PREG, ED: Preg Test, Ur: NEGATIVE

## 2022-06-14 MED ORDER — KETOROLAC TROMETHAMINE 10 MG PO TABS: 10.0000 mg | ORAL_TABLET | Freq: Four times a day (QID) | ORAL | 0 refills | Status: DC | PRN

## 2022-06-14 MED ORDER — CEFDINIR 300 MG PO CAPS: 300.0000 mg | ORAL_CAPSULE | Freq: Two times a day (BID) | ORAL | 0 refills | Status: AC

## 2022-06-14 NOTE — ED Provider Triage Note (Signed)
Emergency Medicine Provider Triage Evaluation Note  Brittney Sanders , a 36 y.o. female  was evaluated in triage.  Pt complains of left-sided flank pain.  History of kidney stones.  Recent hysterectomy June 22.  Review of Systems  Positive: Flank pain Negative: Fever or chills    Physical Exam  BP 119/85   Pulse 80   Temp 98.9 F (37.2 C)   Resp 17   LMP 01/17/2022   SpO2 100%  Gen:   Awake, no distress   Resp:  Normal effort  MSK:   Moves extremities without difficulty  Other:  Abdomen is tender in the left flank  Medical Decision Making  Medically screening exam initiated at 7:32 AM.  Appropriate orders placed.  Brittney Sanders was informed that the remainder of the evaluation will be completed by another provider, this initial triage assessment does not replace that evaluation, and the importance of remaining in the ED until their evaluation is complete.  CT renal stone   Brittney Ghee, PA-C 06/14/22 [phone]

## 2022-06-14 NOTE — ED Notes (Signed)
Pt presents to the ED via POV, from home. Pt c/o L sided abd pain, sharp and constant in nature, states that it started suddenly yesterday. Denies NVD. Denies fever. Denies urinary symptoms. Pt states only abd surgeries was a total hysterectomy. Denies any other GI issues. Pt is A&Ox4 and NAD

## 2022-06-14 NOTE — ED Triage Notes (Signed)
Pt comes with c/o left sided belly pain that started yesterday. Pt states no N/V or D. Pt denies any pain with urination. Pt does state hx of UTIs.  Pt does state total hysterectomy June 22nd. Pt denies any bleeding.

## 2022-06-14 NOTE — ED Provider Notes (Signed)
[facility] Provider Note   Event Date/Time   First MD Initiated Contact with Patient 06/14/22 [phone]     (approximate) History  Abdominal Pain  HPI Brittney Sanders is a 36 y.o. female with a past medical history of kidney stones who presents for left-sided abdominal pain and mild dysuria that began yesterday and has been somewhat worsening since onset.  Patient denies any exacerbating or relieving factors for this pain.  Patient describes it as a 10/10, throbbing, aching pain that radiates down into her groin. ROS: Patient currently denies any vision changes, tinnitus, difficulty speaking, facial droop, sore throat, chest pain, shortness of breath, nausea/vomiting/diarrhea, or weakness/numbness/paresthesias in any extremity   Physical Exam  Triage Vital Signs: ED Triage Vitals  Enc Vitals Group     BP 06/14/22 0724 119/85     Pulse Rate 06/14/22 0724 80     Resp 06/14/22 0724 17     Temp 06/14/22 0724 98.9 F (37.2 C)     Temp src --      SpO2 06/14/22 0724 100 %     Weight 06/14/22 1011 260 lb (117.9 kg)     Height 06/14/22 1011 5\' 3"  (1.6 m)     Head Circumference --      Peak Flow --      Pain Score 06/14/22 0724 10     Pain Loc --      Pain Edu? --      Excl. in [place]? --    Most recent vital signs: Vitals:   06/14/22 0724 06/14/22 0947  BP: 119/85 110/70  Pulse: 80 85  Resp: 17 20  Temp: 98.9 F (37.2 C)   SpO2: 100% 100%   General: Awake, oriented x4. CV:  Good peripheral perfusion.  Resp:  Normal effort.  Abd:  No distention.  Negative left CVA tenderness to percussion Other:  Obese middle-aged Caucasian female laying in bed in no acute distress. ED Results / Procedures / Treatments  Labs (all labs ordered are listed, but only abnormal results are displayed) Labs Reviewed  COMPREHENSIVE METABOLIC PANEL - Abnormal; Notable for the following components:      Result Value   Glucose, Bld 101 (*)    All other components within normal limits   URINALYSIS, ROUTINE W REFLEX MICROSCOPIC - Abnormal; Notable for the following components:   Color, Urine YELLOW (*)    APPearance HAZY (*)    Leukocytes,Ua LARGE (*)    Bacteria, UA RARE (*)    All other components within normal limits  LIPASE, BLOOD  CBC  POC URINE PREG, ED   RADIOLOGY ED MD interpretation: CT renal stone study interpreted by me and shows a 4 mm left renal calculus in the parenchyma however no evidence of ureteral calculi, hydronephrosis, or other acute findings. -Agree with radiology assessment Official radiology report(s): CT Renal Stone Study  Result Date: 06/14/2022 CLINICAL DATA:  Left-sided flank and abdominal pain beginning yesterday. Nephrolithiasis. EXAM: CT ABDOMEN AND PELVIS WITHOUT CONTRAST TECHNIQUE: Multidetector CT imaging of the abdomen and pelvis was performed following the standard protocol without IV contrast. RADIATION DOSE REDUCTION: This exam was performed according to the departmental dose-optimization program which includes automated exposure control, adjustment of the mA and/or kV according to patient size and/or use of iterative reconstruction technique. COMPARISON:  04/16/2022 FINDINGS: Lower chest: No acute findings. Hepatobiliary: No mass visualized on this unenhanced exam. Gallbladder is unremarkable. No evidence of biliary ductal dilatation. Pancreas: No mass or inflammatory process  visualized on this unenhanced exam. Spleen:  Within normal limits in size. Adrenals/Urinary tract: Stable chronic left renal parenchymal scarring. 4 mm calculus again seen in lower pole of left kidney. No evidence of ureteral calculi hydronephrosis. Unremarkable unopacified urinary bladder. Stomach/Bowel: No evidence of obstruction, inflammatory process, or abnormal fluid collections. Normal appendix visualized. Vascular/Lymphatic: No pathologically enlarged lymph nodes identified. No evidence of abdominal aortic aneurysm. Reproductive: Prior hysterectomy noted. Adnexal  regions are unremarkable in appearance. Other:  None. Musculoskeletal:  No suspicious bone lesions identified. IMPRESSION: 4 mm left renal calculus. No evidence of ureteral calculi, hydronephrosis, or other acute findings. Stable chronic left renal parenchymal scarring. Electronically Signed   By: Danae Orleans M.D.   On: 06/14/2022 09:07   PROCEDURES: Critical Care performed: No Procedures MEDICATIONS ORDERED IN ED: Medications - No data to display IMPRESSION / MDM / ASSESSMENT AND PLAN / ED COURSE  I reviewed the triage vital signs and the nursing notes.                             The patient is on the cardiac monitor to evaluate for evidence of arrhythmia and/or significant heart rate changes. Patient's presentation is most consistent with acute presentation with potential threat to life or bodily function. Not Pregnant. Unlikely TOA, Ovarian Torsion, PID, gonorrhea/chlamydia. Low suspicion for Infected Urolithiasis, AAA, Cholecystitis, Pancreatitis, SBO, Appendicitis, or other acute abdomen.  Rx: Cefdinir 300 mg BID for 5 days Disposition: Discharge home. SRP discussed. Advise follow up with primary care provider within 24-72 hours.   FINAL CLINICAL IMPRESSION(S) / ED DIAGNOSES   Final diagnoses:  Left lower quadrant abdominal pain  Urinary tract infection without hematuria, site unspecified   Rx / DC Orders   ED Discharge Orders          Ordered    cefdinir (OMNICEF) 300 MG capsule  2 times daily        06/14/22 1036    ketorolac (TORADOL) 10 MG tablet  Every 6 hours PRN       Note to Pharmacy: Patient given an IM/IV loading dose in emergency department   06/14/22 1036           Note:  This document was prepared using Dragon voice recognition software and may include unintentional dictation errors.   Merwyn Katos, MD 06/14/22 1039

## 2022-06-14 NOTE — ED Notes (Signed)
Dr. Vicente Males, EDP at bedside

## 2022-06-25 NOTE — Unmapped (Signed)
[facility] Refill Coordination Note    Specialty Medication(s) to be Shipped:   Inflammatory Disorders: Dupixent    Other medication(s) to be shipped: No additional medications requested for fill at this time     Samantha Miles, DOB: 17-Dec-1985  Phone: [phone] (home)       All above HIPAA information was verified with patient.     Was a Nurse, learning disability used for this call? No    Completed refill call assessment today to schedule patient's medication shipment from the [facility] [phone]).  All relevant notes have been reviewed.     Specialty medication(s) and dose(s) confirmed: Regimen is correct and unchanged.   Changes to medications: Samantha Miles reports no changes at this time.  Changes to insurance: No  New side effects reported not previously addressed with a pharmacist or physician: None reported  Questions for the pharmacist: No    Confirmed patient received a Conservation officer, historic buildings and a Surveyor, mining with first shipment. The patient will receive a drug information handout for each medication shipped and additional FDA Medication Guides as required.       DISEASE/MEDICATION-SPECIFIC INFORMATION        For patients on injectable medications: Patient currently has 0 doses left.  Next injection is scheduled for 07/05/2022.    SPECIALTY MEDICATION ADHERENCE     Medication Adherence    Patient reported X missed doses in the last month: 0  Specialty Medication: Dupixent  Patient is on additional specialty medications: No  Any gaps in refill history greater than 2 weeks in the last 3 months: no  Demonstrates understanding of importance of adherence: yes  Informant: patient  Reliability of informant: reliable  Confirmed plan for next specialty medication refill: delivery by pharmacy  Refills needed for supportive medications: not needed              Were doses missed due to medication being on hold? No     Dupixent 300 mg/2 ml: 0 on hand        REFERRAL TO PHARMACIST Referral to the pharmacist: Not needed      [facility]     Shipping address confirmed in Epic.     Delivery Scheduled: Yes, Expected medication delivery date: 06/26/2022.     Medication will be delivered via Same Day Courier to the prescription address in Epic WAM.    Samantha Miles   [facility] Specialty Technician

## 2022-06-26 MED FILL — DUPIXENT 300 MG/2 ML SUBCUTANEOUS PEN INJECTOR: SUBCUTANEOUS | 28 days supply | Qty: 4 | Fill #5

## 2022-07-08 ENCOUNTER — Encounter: Payer: Medicare HMO | Admitting: Obstetrics

## 2022-07-23 NOTE — Unmapped (Signed)
[facility] Refill Coordination Note    Specialty Medication(s) to be Shipped:   Inflammatory Disorders: Dupixent    Other medication(s) to be shipped: No additional medications requested for fill at this time     Samantha Miles, DOB: Jul 05, 1986  Phone: [phone] (home)       All above HIPAA information was verified with patient.     Was a Nurse, learning disability used for this call? No    Completed refill call assessment today to schedule patient's medication shipment from the [facility] [phone]).  All relevant notes have been reviewed.     Specialty medication(s) and dose(s) confirmed: Regimen is correct and unchanged.   Changes to medications: [name] reports no changes at this time.  Changes to insurance: No  New side effects reported not previously addressed with a pharmacist or physician: None reported  Questions for the pharmacist: No    Confirmed patient received a Conservation officer, historic buildings and a Surveyor, mining with first shipment. The patient will receive a drug information handout for each medication shipped and additional FDA Medication Guides as required.       DISEASE/MEDICATION-SPECIFIC INFORMATION        For patients on injectable medications: Patient currently has 0 doses left.  Next injection is scheduled for 08/02/2022.    SPECIALTY MEDICATION ADHERENCE     Medication Adherence    Patient reported X missed doses in the last month: 0  Specialty Medication: Dupixent  Patient is on additional specialty medications: No  Any gaps in refill history greater than 2 weeks in the last 3 months: no  Demonstrates understanding of importance of adherence: yes  Informant: patient  Reliability of informant: reliable              Confirmed plan for next specialty medication refill: delivery by pharmacy  Refills needed for supportive medications: not needed              Were doses missed due to medication being on hold? No     Dupixent 300 mg/2 ml: 0 on hand        REFERRAL TO PHARMACIST     Referral to the pharmacist: Not needed      [facility]     Shipping address confirmed in Epic.     Delivery Scheduled: Yes, Expected medication delivery date: 07/30/2022.     Medication will be delivered via Same Day Courier to the prescription address in Epic WAM.    Valere Dross   [facility] Specialty Technician

## 2022-07-30 MED FILL — DUPIXENT 300 MG/2 ML SUBCUTANEOUS PEN INJECTOR: SUBCUTANEOUS | 28 days supply | Qty: 4 | Fill #6

## 2022-08-20 NOTE — Unmapped (Signed)
[facility] Refill Coordination Note    Specialty Medication(s) to be Shipped:   Inflammatory Disorders: Dupixent    Other medication(s) to be shipped: No additional medications requested for fill at this time     Samantha Miles, DOB: 31-May-1986  Phone: [phone] (home)       All above HIPAA information was verified with patient.     Was a Nurse, learning disability used for this call? No    Completed refill call assessment today to schedule patient's medication shipment from the [facility] [phone]).  All relevant notes have been reviewed.     Specialty medication(s) and dose(s) confirmed: Regimen is correct and unchanged.   Changes to medications: Samantha Miles reports no changes at this time.  Changes to insurance: No  New side effects reported not previously addressed with a pharmacist or physician: None reported  Questions for the pharmacist: No    Confirmed patient received a Conservation officer, historic buildings and a Surveyor, mining with first shipment. The patient will receive a drug information handout for each medication shipped and additional FDA Medication Guides as required.       DISEASE/MEDICATION-SPECIFIC INFORMATION        For patients on injectable medications: Patient currently has 0 doses left.  Next injection is scheduled for 08/30/2022.    SPECIALTY MEDICATION ADHERENCE     Medication Adherence    Patient reported X missed doses in the last month: 0  Specialty Medication: Dupixent  Patient is on additional specialty medications: No  Any gaps in refill history greater than 2 weeks in the last 3 months: no  Demonstrates understanding of importance of adherence: yes  Informant: patient  Reliability of informant: reliable              Confirmed plan for next specialty medication refill: delivery by pharmacy  Refills needed for supportive medications: not needed              Were doses missed due to medication being on hold? No     Dupixent 300 mg/2 ml: 0 on hand        REFERRAL TO PHARMACIST     Referral to the pharmacist: Not needed      [facility]     Shipping address confirmed in Epic.     Delivery Scheduled: Yes, Expected medication delivery date: 08/21/2022.     Medication will be delivered via Same Day Courier to the prescription address in Epic WAM.    Valere Dross   [facility] Specialty Technician

## 2022-08-21 DIAGNOSIS — L209 Atopic dermatitis, unspecified: Principal | ICD-10-CM

## 2022-08-21 MED ORDER — DUPIXENT 300 MG/2 ML SUBCUTANEOUS PEN INJECTOR
SUBCUTANEOUS | 7 refills | 28.00000 days | Status: CP
Start: 2022-08-21 — End: ?
  Filled 2022-08-21: qty 4, 28d supply, fill #7
  Filled 2022-09-22: qty 4, 28d supply, fill #0

## 2022-09-18 NOTE — Unmapped (Signed)
[facility] Refill Coordination Note    Specialty Medication(s) to be Shipped:   Inflammatory Disorders: Dupixent    Other medication(s) to be shipped: No additional medications requested for fill at this time     Samantha Miles, DOB: 1986/03/23  Phone: [phone] (home)       All above HIPAA information was verified with patient.     Was a Nurse, learning disability used for this call? No    Completed refill call assessment today to schedule patient's medication shipment from the [facility] [phone]).  All relevant notes have been reviewed.     Specialty medication(s) and dose(s) confirmed: Regimen is correct and unchanged.   Changes to medications: Merly reports no changes at this time.  Changes to insurance: No  New side effects reported not previously addressed with a pharmacist or physician: None reported  Questions for the pharmacist: No    Confirmed patient received a Conservation officer, historic buildings and a Surveyor, mining with first shipment. The patient will receive a drug information handout for each medication shipped and additional FDA Medication Guides as required.       DISEASE/MEDICATION-SPECIFIC INFORMATION        For patients on injectable medications: Patient currently has 0 doses left.  Next injection is scheduled for 10/29.    SPECIALTY MEDICATION ADHERENCE     Medication Adherence    Patient reported X missed doses in the last month: 0  Specialty Medication: Dupixent  Patient is on additional specialty medications: No  Any gaps in refill history greater than 2 weeks in the last 3 months: no  Demonstrates understanding of importance of adherence: yes  Informant: patient  Reliability of informant: reliable              Confirmed plan for next specialty medication refill: delivery by pharmacy  Refills needed for supportive medications: not needed              Were doses missed due to medication being on hold? No     Dupixent 300 mg/2 ml: 0 on hand        REFERRAL TO PHARMACIST     Referral to the pharmacist: Not needed      [facility]     Shipping address confirmed in Epic.     Delivery Scheduled: Yes, Expected medication delivery date: 10/24.     Medication will be delivered via Same Day Courier to the prescription address in Epic WAM.    Valere Dross   [facility] Specialty Technician

## 2022-09-27 ENCOUNTER — Emergency Department
Admission: EM | Admit: 2022-09-27 | Discharge: 2022-09-27 | Disposition: A | Discharge status: Home / Self Care | Payer: Medicare HMO | Attending: Emergency Medicine | Admitting: Emergency Medicine

## 2022-09-27 ENCOUNTER — Emergency Department: Payer: Medicare HMO

## 2022-09-27 ENCOUNTER — Other Ambulatory Visit: Payer: Self-pay

## 2022-09-27 DIAGNOSIS — R109 Unspecified abdominal pain: Secondary | ICD-10-CM

## 2022-09-27 DIAGNOSIS — R3911 Hesitancy of micturition: Secondary | ICD-10-CM | POA: Insufficient documentation

## 2022-09-27 DIAGNOSIS — N200 Calculus of kidney: Secondary | ICD-10-CM | POA: Insufficient documentation

## 2022-09-27 DIAGNOSIS — D72829 Elevated white blood cell count, unspecified: Secondary | ICD-10-CM | POA: Insufficient documentation

## 2022-09-27 LAB — CBC
HCT: 43.1 % (ref 36.0–46.0)
Hemoglobin: 14.2 g/dL (ref 12.0–15.0)
MCH: 29.2 pg (ref 26.0–34.0)
MCHC: 32.9 g/dL (ref 30.0–36.0)
MCV: 88.5 fL (ref 80.0–100.0)
Platelets: 402 10*3/uL — ABNORMAL HIGH (ref 150–400)
RBC: 4.87 MIL/uL (ref 3.87–5.11)
RDW: 13.3 % (ref 11.5–15.5)
WBC: 12.3 10*3/uL — ABNORMAL HIGH (ref 4.0–10.5)
nRBC: 0 % (ref 0.0–0.2)

## 2022-09-27 LAB — URINALYSIS, ROUTINE W REFLEX MICROSCOPIC
Bacteria, UA: NONE SEEN
Bilirubin Urine: NEGATIVE
Glucose, UA: NEGATIVE mg/dL
Hgb urine dipstick: NEGATIVE
Ketones, ur: NEGATIVE mg/dL
Nitrite: NEGATIVE
Protein, ur: NEGATIVE mg/dL
Specific Gravity, Urine: 1.026 (ref 1.005–1.030)
pH: 5 (ref 5.0–8.0)

## 2022-09-27 LAB — COMPREHENSIVE METABOLIC PANEL
ALT: 17 U/L (ref 0–44)
AST: 19 U/L (ref 15–41)
Albumin: 4 g/dL (ref 3.5–5.0)
Alkaline Phosphatase: 67 U/L (ref 38–126)
Anion gap: 8 (ref 5–15)
BUN: 13 mg/dL (ref 6–20)
CO2: 24 mmol/L (ref 22–32)
Calcium: 9.3 mg/dL (ref 8.9–10.3)
Chloride: 106 mmol/L (ref 98–111)
Creatinine, Ser: 0.84 mg/dL (ref 0.44–1.00)
GFR, Estimated: >60 mL/min (ref >60)
Glucose, Bld: 93 mg/dL (ref 70–99)
Potassium: 4.1 mmol/L (ref 3.5–5.1)
Sodium: 138 mmol/L (ref 135–145)
Total Bilirubin: 0.9 mg/dL (ref 0.3–1.2)
Total Protein: 7 g/dL (ref 6.5–8.1)

## 2022-09-27 LAB — PREGNANCY, URINE: Preg Test, Ur: NEGATIVE

## 2022-09-27 LAB — LIPASE, BLOOD: Lipase: 30 U/L (ref 11–51)

## 2022-09-27 MED ORDER — CEPHALEXIN 500 MG PO CAPS: 500.0000 mg | ORAL_CAPSULE | Freq: Four times a day (QID) | ORAL | 0 refills | Status: AC

## 2022-09-27 MED ORDER — IOHEXOL 300 MG/ML  SOLN
100.0000 mL | Freq: Once | INTRAMUSCULAR | Status: AC | PRN
  Administered 2022-09-27: 100 mL via INTRAVENOUS

## 2022-09-27 NOTE — ED Triage Notes (Signed)
Patient reports difficulty urinating and intermittent sharp left lower abdominal pain for the past 3 hours. Reports frequent UTI's. Denies nasuea, vomiting or diarrhea. AOX4. Ambulatory with steady gait. Speaking in full clear sentences.

## 2022-09-27 NOTE — ED Provider Notes (Signed)
[facility] Provider Note  Patient Contact: 9:13 PM (approximate)   History   Abdominal Pain   HPI  Brittney Sanders is a 36 y.o. female presents to the emergency department with left flank pain and urinary hesitancy that started acutely this afternoon around 3:00 PM.  Patient denies fever and chills.  No nausea or vomiting.  Patient does have a history of frequent urinary tract infections and nephrolithiasis.  No chest pain, chest tightness or shortness of breath.      Physical Exam   Triage Vital Signs: ED Triage Vitals [09/27/22 1918]  Enc Vitals Group     BP 109/71     Pulse Rate 96     Resp 18     Temp 98.2 F (36.8 C)     Temp Source Oral     SpO2 100 %     Weight 240 lb (108.9 kg)     Height 5\' 5"  (1.651 m)     Head Circumference      Peak Flow      Pain Score 6     Pain Loc      Pain Edu?      Excl. in [place]?     Most recent vital signs: Vitals:   09/27/22 1918  BP: 109/71  Pulse: 96  Resp: 18  Temp: 98.2 F (36.8 C)  SpO2: 100%     General: Alert and in no acute distress. Eyes:  PERRL. EOMI. Head: No acute traumatic findings ENT:      Nose: No congestion/rhinnorhea.      Mouth/Throat: Mucous membranes are moist. Neck: No stridor. No cervical spine tenderness to palpation. Cardiovascular:  Good peripheral perfusion Respiratory: Normal respiratory effort without tachypnea or retractions. Lungs CTAB. Good air entry to the bases with no decreased or absent breath sounds. Gastrointestinal: Bowel sounds 4 quadrants. Soft and nontender to palpation. No guarding or rigidity. No palpable masses. No distention. No CVA tenderness. Musculoskeletal: Full range of motion to all extremities.  Neurologic:  No gross focal neurologic deficits are appreciated.  Skin:   No rash noted Other:   ED Results / Procedures / Treatments   Labs (all labs ordered are listed, but only abnormal results are displayed) Labs Reviewed  CBC - Abnormal;  Notable for the following components:      Result Value   WBC 12.3 (*)    Platelets 402 (*)    All other components within normal limits  URINALYSIS, ROUTINE W REFLEX MICROSCOPIC - Abnormal; Notable for the following components:   Color, Urine AMBER (*)    APPearance HAZY (*)    Leukocytes,Ua SMALL (*)    All other components within normal limits  URINE CULTURE  LIPASE, BLOOD  COMPREHENSIVE METABOLIC PANEL  PREGNANCY, URINE       RADIOLOGY  I personally viewed and evaluated these images as part of my medical decision making, as well as reviewing the written report by the radiologist.  ED Provider Interpretation: Left lower pole nephrolithiasis without other acute abnormality in the abdomen or pelvis   PROCEDURES:  Critical Care performed: No  Procedures   MEDICATIONS ORDERED IN ED: Medications  iohexol (OMNIPAQUE) 300 MG/ML solution 100 mL (100 mLs Intravenous Contrast Given 09/27/22 2212)     IMPRESSION / MDM / [place] / ED COURSE  I reviewed the triage vital signs and the nursing notes.  Assessment and plan Abdominal pain 36 year old female presents to the emergency department with urinary hesitancy and left-sided flank pain that started today around 3.  Vital signs were reassuring at triage.  On exam, patient was resting comfortably.  Differential diagnosis includes UTI, pyelonephritis, nephrolithiasis, bowel obstruction, ovarian cyst...   Urinalysis not consistent with UTI.  Urine culture in process.  CBC shows mildly elevated white blood cell count.  CMP within range.  Lipase within range.  Urine pregnancy test was negative.  CT abdomen pelvis shows no evidence of nephrolithiasis or other acute abnormality.  Given patient's history of urinary tract infections and patient endorsement that current symptoms feel similar to UTIs in the past, will treat patient empirically with Keflex 4 times daily for the next 7 days.   Return precautions were given to return with new or worsening symptoms.   FINAL CLINICAL IMPRESSION(S) / ED DIAGNOSES   Final diagnoses:  Flank pain     Rx / DC Orders   ED Discharge Orders          Ordered    cephALEXin (KEFLEX) 500 MG capsule  4 times daily        09/27/22 2247             Note:  This document was prepared using Dragon voice recognition software and may include unintentional dictation errors.   Pia Mau Chipley, PA-C 09/27/22 2250    Chesley Noon, MD 09/28/22 [phone]

## 2022-09-27 NOTE — Discharge Instructions (Addendum)
Take Keflex four times daily for the next seven days.  

## 2022-09-27 NOTE — ED Notes (Signed)
Attempted iv insertion x1 without success, dessie, rn in to attempt iv initiation.

## 2022-09-29 LAB — URINE CULTURE: Culture: <10000 — AB

## 2022-10-15 NOTE — Unmapped (Signed)
[facility] Refill Coordination Note    Specialty Medication(s) to be Shipped:   Inflammatory Disorders: Dupixent    Other medication(s) to be shipped: No additional medications requested for fill at this time     Samantha Miles, DOB: 25-Sep-1986  Phone: [phone] (home)       All above HIPAA information was verified with patient.     Was a Nurse, learning disability used for this call? No    Completed refill call assessment today to schedule patient's medication shipment from the [facility] [phone]).  All relevant notes have been reviewed.     Specialty medication(s) and dose(s) confirmed: Regimen is correct and unchanged.   Changes to medications: Izzie reports no changes at this time.  Changes to insurance: No  New side effects reported not previously addressed with a pharmacist or physician: None reported  Questions for the pharmacist: No    Confirmed patient received a Conservation officer, historic buildings and a Surveyor, mining with first shipment. The patient will receive a drug information handout for each medication shipped and additional FDA Medication Guides as required.       DISEASE/MEDICATION-SPECIFIC INFORMATION        For patients on injectable medications: Patient currently has 0 doses left.  Next injection is scheduled for 11/26.    SPECIALTY MEDICATION ADHERENCE     Medication Adherence    Patient reported X missed doses in the last month: 0  Specialty Medication: Dupixent  Patient is on additional specialty medications: No  Any gaps in refill history greater than 2 weeks in the last 3 months: no  Demonstrates understanding of importance of adherence: yes  Informant: patient  Reliability of informant: reliable              Confirmed plan for next specialty medication refill: delivery by pharmacy  Refills needed for supportive medications: not needed              Were doses missed due to medication being on hold? No     Dupixent 300 mg/2 ml: 0 on hand        REFERRAL TO PHARMACIST     Referral to the pharmacist: Not needed      [facility]     Shipping address confirmed in Epic.     Delivery Scheduled: Yes, Expected medication delivery date: 11/17.     Medication will be delivered via Same Day Courier to the prescription address in Epic WAM.    Valere Dross   [facility] Specialty Technician

## 2022-10-16 MED FILL — DUPIXENT 300 MG/2 ML SUBCUTANEOUS PEN INJECTOR: SUBCUTANEOUS | 28 days supply | Qty: 4 | Fill #1

## 2022-10-17 ENCOUNTER — Ambulatory Visit
Admit: 2022-10-17 | Discharge: 2022-10-17 | Disposition: A | Discharge status: Home / Self Care | Payer: MEDICARE | Attending: Family

## 2022-10-17 DIAGNOSIS — K047 Periapical abscess without sinus: Principal | ICD-10-CM

## 2022-10-17 MED ORDER — AMOXICILLIN 500 MG CAPSULE
ORAL_CAPSULE | Freq: Three times a day (TID) | ORAL | 0 refills | 10 days | Status: CP
Start: 2022-10-17 — End: 2022-10-27

## 2022-10-17 MED ADMIN — amoxicillin (AMOXIL) capsule 500 mg: 500 mg | ORAL | @ 17:00:00 | Stop: 2022-10-17

## 2022-10-17 MED ADMIN — oxyCODONE (ROXICODONE) immediate release tablet 10 mg: 10 mg | ORAL | @ 17:00:00 | Stop: 2022-10-17

## 2022-10-17 NOTE — Unmapped (Signed)
Pt c/o R lower dental swelling and pain. Endorsing trouble w/ swallowing due to pain. Pt maintaining airway independently and denies trouble controlling secretions att.

## 2022-10-17 NOTE — Unmapped (Signed)
[facility]  Emergency Department Provider Note      ED Clinical Impression     Final diagnoses:   Dental infection (Primary)       Initial Impression, ED Course, Assessment and Plan     Impression: dental infection    Samantha Miles is a 36 y.o. female with no significant pmhx, who presents to ED for evaluation of dental pain over the last couple of weeks, with worsening swelling onset yesterday.  Has a dental appointment on 11/27.  No fevers.  Upon exam, patient is well appearing, with VS notable for HR 122, otherwise normal.  No fever.  Mild R mandibular swelling, without trismus, and TTP tooth #30.  Consistent with dental infection.  Nothing to drain in ED.  Will treat with amoxcillin 500mg  TID x 10d.  Discussed pain control, importance of dental follow up as scheduled and ED return precautions.   ____________________________________________    Time seen: October 17, 2022 11:23 AM    I have reviewed the triage vital signs and the nursing notes.    This visit was not staffed with an ED attending.    Additional Medical Decision Making     I have reviewed the vital signs and the nursing notes. Labs and radiology results that were available during my care of the patient were independently reviewed by me and considered in my medical decision making.   History     Chief Complaint  Dental Swelling      HPI   Samantha Miles is a 36 y.o. female with no significant pmhx, who presents to ED for evaluation of dental pain over the last couple of weeks, with worsening swelling onset yesterday.  States saw a dentis on 11/12, and was referred to a specialist for management, but her appointment is not until 11/27.  States yesterday, pain and swelling increased.  Denies any fever, chills, vomiting.  Did feel nauseated today.  No drainage.         Past Medical History:   Diagnosis Date    Depression     Eczema        There is no problem list on file for this patient.      Past Surgical History:   Procedure Laterality Date    INNER EAR SURGERY      MIDDLE EAR SURGERY      TUBAL LIGATION         No current facility-administered medications for this encounter.    Current Outpatient Medications:     betamethasone dipropionate (DIPROLENE) 0.05 % ointment, betamethasone dipropionate 0.05 % topical ointment, Disp: , Rfl:     betamethasone, augmented, (DIPROLENE) 0.05 % cream, Apply twice daily to areas of eczema until flat and smooth. Avoid face and skin folds, Disp: 50 g, Rfl: 5    clobetasoL (TEMOVATE) 0.05 % ointment, Apply to eczema on hands at night until resolved., Disp: 60 g, Rfl: 5    dupilumab (DUPIXENT PEN) 300 mg/2 mL PnIj, Inject the contents of 1 pen (300 mg total) under the skin every fourteen (14) days., Disp: 4 mL, Rfl: 7    empty container Misc, Use as directed to dispose of Dupixent syringes., Disp: 1 each, Rfl: 2    fexofenadine (ALLEGRA) 180 MG tablet, Take 180 mg by mouth daily., Disp: , Rfl: 11    guaiFENesin 200 mg tablet, Take 2 tablets (400 mg total) by mouth every six (6) hours as needed for congestion., Disp: 30 tablet, Rfl: 0  hydroCHLOROthiazide (HYDRODIURIL) 12.5 MG tablet, Take 12.5 mg by mouth daily., Disp: , Rfl:     ibuprofen (ADVIL,MOTRIN) 800 MG tablet, TAKE 1 TABLET BY MOUTH EVERY 8 HOURS AS NEEDED FOR MODERATE PAIN, Disp: , Rfl: 0    lidocaine 2% viscous (LIDOCAINE) 2 % Soln, 10 mL by Mouth route every three (3) hours as needed (sore throat)., Disp: 120 mL, Rfl: 0    meloxicam (MOBIC) 15 MG tablet, Mobic 15 mg tablet  Take 1 tablet(s) every day by oral route., Disp: , Rfl:     nystatin (MYCOSTATIN) 100,000 unit/gram ointment, Apply 1 application topically Two (2) times a day. To scaly areas on feet and between toes, Disp: 30 g, Rfl: 5    nystatin (MYCOSTATIN) powder, , Disp: , Rfl:     ondansetron (ZOFRAN) 4 MG tablet, Take 4 mg by mouth., Disp: , Rfl:     oxyCODONE (ROXICODONE) 5 MG immediate release tablet, Take 1 tablet (5 mg total) by mouth every four (4) hours as needed for pain for up to 12 doses., Disp: 12 tablet, Rfl: 0    PARoxetine (PAXIL) 40 MG tablet, , Disp: , Rfl:     TRUEPLUS PEN NEEDLE 31 gauge x 1/4 (6 mm) Ndle, , Disp: , Rfl:     VICTOZA 2-PAK 0.6 mg/0.1 mL (18 mg/3 mL) injection, , Disp: , Rfl:     Allergies  Sulfacetamide sodium and Sulfamethoxazole    Family History   Problem Relation Age of Onset    Multiple sclerosis Mother     Melanoma Neg Hx     Basal cell carcinoma Neg Hx     Squamous cell carcinoma Neg Hx        Social History  Social History     Tobacco Use    Smoking status: Never    Smokeless tobacco: Never   Substance Use Topics    Alcohol use: No    Drug use: No       Review of Systems    A complete review of systems was performed and is negative other than as addressed in the HPI.    Physical Exam     ED Triage Vitals   Enc Vitals Group      BP 10/17/22 1103 129/95      Heart Rate 10/17/22 1050 122      SpO2 Pulse --       Resp 10/17/22 1101 16      Temp 10/17/22 1103 36.9 ??C (98.4 ??F)      Temp Source 10/17/22 1103 Oral      SpO2 10/17/22 1050 98 %      Weight 10/17/22 1104 (!) 108.9 kg (240 lb)      Height --       Head Circumference --       Peak Flow --       Pain Score --       Pain Loc --       Pain Edu? --       Excl. in GC? --        Constitutional: Alert and oriented. Well appearing and in no distress.  Eyes: Conjunctivae are normal.  ENT       Head: Normocephalic and atraumatic.       Mouth/Throat: Mucous membranes are moist. R mandibular swelling (mild) with TTP to tooth #30.  Buccal side induration, without fluctuance or gingival swelling.  No drainage currently.  No trismus.  Neck: Supple  Hematological/Lymphatic/Immunilogical: No cervical lymphadenopathy.  Cardiovascular: Normal rate, regular rhythm.   Respiratory: Normal respiratory effort. Breath sounds are normal.  Musculoskeletal: Nontender with normal range of motion in all extremities.       Right lower leg: No tenderness or edema.       Left lower leg: No tenderness or edema.  Neurologic: Normal speech and language. No gross focal neurologic deficits are appreciated.  Skin: Skin is warm, dry and intact. No rash noted.  Psychiatric: Mood and affect are normal. Speech and behavior are normal.      Pertinent labs & imaging results that were available during my care of the patient were reviewed by me and considered in my medical decision making (see chart for details).       Marcelle Overlie D, FNP  10/17/22 [phone]

## 2022-10-17 NOTE — Unmapped (Signed)
Patient here with dental pain. Patient has known broken tooth on the right.

## 2022-10-18 NOTE — Unmapped (Signed)
Upcoming Appt:  No future appointments.    Recent:   What is the date of your last related visit?  Seen in ER 10/17/22  Related acute medications Rx'd:  amoxicillin (AMOXIL) 500 MG capsule   Home treatment tried:  tylenol 325 mg and motrin 200 mg- last took at 200 mg motrin at 9:30 am      Relevant:   Allergies: Sulfacetamide sodium and Sulfamethoxazole  Medications:  Amoxicillin 500 mg TID for 10 days  Health History: Dx with tooth infection in er 10/18/22  Weight: na      [place] [facility] patients only:  What was the date of your last cancer treatment (mm/dd/yy)?: na  Was the treatment oral or infusion?: na  Are you currently on TVEC (yes/no)?: na    MOTRIN ADULT 200MG  TABLETS  96+ lbs: 2 tabs/400mg  q 6-8 hrs    Reason for Disposition   [1] Face is swollen AND [2] no fever    Additional Information   Negative: Face is very swollen     Face is swollen but on ABT for tooth infection    Protocols used: Toothache-A-AH

## 2022-10-19 LAB — CBC W/ AUTO DIFF
BASOPHILS ABSOLUTE COUNT: 0.1 10*9/L (ref 0.0–0.1)
BASOPHILS RELATIVE PERCENT: 0.7 %
EOSINOPHILS ABSOLUTE COUNT: 0.1 10*9/L (ref 0.0–0.5)
EOSINOPHILS RELATIVE PERCENT: 1.5 %
HEMATOCRIT: 39.9 % (ref 34.0–44.0)
HEMOGLOBIN: 13.6 g/dL (ref 11.3–14.9)
LYMPHOCYTES ABSOLUTE COUNT: 3 10*9/L (ref 1.1–3.6)
LYMPHOCYTES RELATIVE PERCENT: 30.4 %
MEAN CORPUSCULAR HEMOGLOBIN CONC: 34.1 g/dL (ref 32.0–36.0)
MEAN CORPUSCULAR HEMOGLOBIN: 29.9 pg (ref 25.9–32.4)
MEAN CORPUSCULAR VOLUME: 87.5 fL (ref 77.6–95.7)
MEAN PLATELET VOLUME: 8 fL (ref 6.8–10.7)
MONOCYTES ABSOLUTE COUNT: 0.8 10*9/L (ref 0.3–0.8)
MONOCYTES RELATIVE PERCENT: 7.8 %
NEUTROPHILS ABSOLUTE COUNT: 5.8 10*9/L (ref 1.8–7.8)
NEUTROPHILS RELATIVE PERCENT: 59.6 %
PLATELET COUNT: 359 10*9/L (ref 150–450)
RED BLOOD CELL COUNT: 4.56 10*12/L (ref 3.95–5.13)
RED CELL DISTRIBUTION WIDTH: 14.2 % (ref 12.2–15.2)
WBC ADJUSTED: 9.8 10*9/L (ref 3.6–11.2)

## 2022-10-19 MED ORDER — AMOXICILLIN 875 MG-POTASSIUM CLAVULANATE 125 MG TABLET
ORAL_TABLET | Freq: Two times a day (BID) | ORAL | 0 refills | 10 days | Status: CP
Start: 2022-10-19 — End: 2022-10-29

## 2022-10-20 ENCOUNTER — Ambulatory Visit
Admit: 2022-10-20 | Discharge: 2022-10-20 | Disposition: A | Discharge status: Home / Self Care | Payer: MEDICARE | Attending: Emergency Medicine

## 2022-10-20 ENCOUNTER — Emergency Department
Admit: 2022-10-20 | Discharge: 2022-10-20 | Disposition: A | Discharge status: Home / Self Care | Payer: MEDICARE | Attending: Emergency Medicine

## 2022-10-20 LAB — COMPREHENSIVE METABOLIC PANEL
ALBUMIN: 3.8 g/dL (ref 3.4–5.0)
ALKALINE PHOSPHATASE: 81 U/L (ref 46–116)
ALT (SGPT): 10 U/L (ref 10–49)
ANION GAP: 9 mmol/L (ref 5–14)
AST (SGOT): 16 U/L (ref <=34)
BILIRUBIN TOTAL: 0.5 mg/dL (ref 0.3–1.2)
BLOOD UREA NITROGEN: 12 mg/dL (ref 9–23)
BUN / CREAT RATIO: 15
CALCIUM: 9.4 mg/dL (ref 8.7–10.4)
CHLORIDE: 105 mmol/L (ref 98–107)
CO2: 26 mmol/L (ref 20.0–31.0)
CREATININE: 0.78 mg/dL
EGFR CKD-EPI (2021) FEMALE: >90 mL/min/{1.73_m2} (ref >=60)
GLUCOSE RANDOM: 97 mg/dL (ref 70–179)
POTASSIUM: 3.7 mmol/L (ref 3.4–4.8)
PROTEIN TOTAL: 7.2 g/dL (ref 5.7–8.2)
SODIUM: 140 mmol/L (ref 135–145)

## 2022-10-20 LAB — HCG QUANTITATIVE, BLOOD: GONADOTROPIN, CHORIONIC (HCG) QUANT: <2.6 m[IU]/mL

## 2022-10-20 MED ORDER — OXYCODONE 5 MG TABLET
ORAL_TABLET | Freq: Four times a day (QID) | ORAL | 0 refills | 3 days | Status: CP | PRN
Start: 2022-10-20 — End: 2022-10-25

## 2022-10-20 MED ADMIN — oxyCODONE (ROXICODONE) immediate release tablet 5 mg: 5 mg | ORAL | @ 07:00:00 | Stop: 2022-10-20

## 2022-10-20 MED ADMIN — ampicillin-sulbactam (UNASYN) 3 g in sodium chloride 0.9 % (NS) 100 mL IVPB-MBP: 3 g | INTRAVENOUS | @ 05:00:00

## 2022-10-20 MED ADMIN — oxyCODONE (ROXICODONE) immediate release tablet 5 mg: 5 mg | ORAL | @ 10:00:00 | Stop: 2022-10-20

## 2022-10-20 MED ADMIN — ketorolac (TORADOL) injection 15 mg: 15 mg | INTRAVENOUS | @ 05:00:00 | Stop: 2022-10-19

## 2022-10-20 MED ADMIN — iohexoL (OMNIPAQUE) 350 mg iodine/mL solution 75 mL: 75 mL | INTRAVENOUS | @ 08:00:00 | Stop: 2022-10-20

## 2022-10-20 NOTE — Unmapped (Signed)
Seen on Saturday for tooth abscess. Was not drained but was given abx. Feeling increase in swelling. No difficulty breathing but having some difficulty swallowing secretions.

## 2022-10-20 NOTE — Unmapped (Signed)
[facility]  Emergency Department Provider Note       ED Clinical Impression     Final diagnoses:   Right facial swelling (Primary)        Impression, Medical Decision Making, ED Course     Impression: This is a 36 y.o. female with no pertinent past medical history who presents with dental swelling. Upon initial evaluation in the emergency department, the patient was non-toxic appearing with reassuring vitals as below.    BP 123/91  - Pulse 94  - Temp 36.4 ??C (97.5 ??F) (Oral)  - Resp 18  - Wt (!) 108.9 kg (240 lb 1.3 oz)  - SpO2 100%  - BMI 42.53 kg/m??     Diagnostic workup as below.  Patient presenting with 1 week history of right-sided dental pain which is worsened over the past 2 days since starting a course of amoxicillin.  My differential includes.  We will abscess, dental infection, facial abscess, cellulitis, among other things.  No periapical abscess on physical exam.  Mild right-sided facial swelling without obvious induration or fluctuance.  We will work-up with CT max face and will reassess after ampicillin and Toradol.    Patient signed out to nighttime provider with disposition pending CT and reevaluation.  Anticipate changing antibiotic regimen to a course of Augmentin.    Orders Placed This Encounter   Procedures    CT Maxillofacial W Contrast    CBC w/ Differential    Comprehensive Metabolic Panel            ____________________________________________    The case was discussed with Dr. Bryson Ha, MD, who is in agreement with the above assessment and plan.    Dictation software was used while making this note. Please excuse any errors made with dictation software.     Additional Medical Decision Making     I have reviewed the vital signs and the nursing notes. Labs and radiology results that were available during my care of the patient were independently reviewed by me and considered in my medical decision making.     I independently visualized the EKG tracing if performed.  I independently visualized the radiology images if performed.  I reviewed the patient's prior medical records if available.  Additional history obtained from family if available.  I discussed the case with the admitting provider and the consulting services if the patient was admitted and/or consulting services were utilized.     History     Chief Complaint  Chief Complaint   Patient presents with    Dental Swelling       HPI   Samantha Miles is a 36 y.o. female with a past medical history as above who is presenting with dental swelling. The patient reports worsening right-sided facial swelling and dental pain that radiates up the face and down the neck today in the setting of two weeks of dental pain. She states that she went to a dentist on 10/11/22 for her pain and was told that she needed a root canal. Her face later started swelling on 11/18, to the point where it was difficult to speak. Her next dental appointment is on 11/27. She reports adherence to her prescribed antibiotics.     Per chart review, the patient was seen at this ED on 10/17/22 for today's symptoms. Physical exam was remarkable for mild R mandibular swelling, without trismus, and TTP tooth #30 consistent with dental infection. She was discharged with 500 mg amoxicillin TID for  10 days.      All other systems have been reviewed and are negative except as otherwise documented.    Past Medical History:   Diagnosis Date    Depression     Eczema        Past Surgical History:   Procedure Laterality Date    INNER EAR SURGERY      MIDDLE EAR SURGERY      TUBAL LIGATION           Current Facility-Administered Medications:     ampicillin-sulbactam (UNASYN) 3 g in sodium chloride 0.9 % (NS) 100 mL IVPB-MBP, 3 g, Intravenous, Once, Eppie Gibson, MD    ketorolac (TORADOL) injection 15 mg, 15 mg, Intravenous, Once, Eppie Gibson, MD    Current Outpatient Medications:     amoxicillin (AMOXIL) 500 MG capsule, Take 1 capsule (500 mg total) by mouth Three (3) times a day for 10 days., Disp: 30 capsule, Rfl: 0    betamethasone dipropionate (DIPROLENE) 0.05 % ointment, betamethasone dipropionate 0.05 % topical ointment, Disp: , Rfl:     betamethasone, augmented, (DIPROLENE) 0.05 % cream, Apply twice daily to areas of eczema until flat and smooth. Avoid face and skin folds, Disp: 50 g, Rfl: 5    clobetasoL (TEMOVATE) 0.05 % ointment, Apply to eczema on hands at night until resolved., Disp: 60 g, Rfl: 5    dupilumab (DUPIXENT PEN) 300 mg/2 mL PnIj, Inject the contents of 1 pen (300 mg total) under the skin every fourteen (14) days., Disp: 4 mL, Rfl: 7    empty container Misc, Use as directed to dispose of Dupixent syringes., Disp: 1 each, Rfl: 2    fexofenadine (ALLEGRA) 180 MG tablet, Take 180 mg by mouth daily., Disp: , Rfl: 11    guaiFENesin 200 mg tablet, Take 2 tablets (400 mg total) by mouth every six (6) hours as needed for congestion., Disp: 30 tablet, Rfl: 0    hydroCHLOROthiazide (HYDRODIURIL) 12.5 MG tablet, Take 12.5 mg by mouth daily., Disp: , Rfl:     ibuprofen (ADVIL,MOTRIN) 800 MG tablet, TAKE 1 TABLET BY MOUTH EVERY 8 HOURS AS NEEDED FOR MODERATE PAIN, Disp: , Rfl: 0    lidocaine 2% viscous (LIDOCAINE) 2 % Soln, 10 mL by Mouth route every three (3) hours as needed (sore throat)., Disp: 120 mL, Rfl: 0    meloxicam (MOBIC) 15 MG tablet, Mobic 15 mg tablet  Take 1 tablet(s) every day by oral route., Disp: , Rfl:     nystatin (MYCOSTATIN) 100,000 unit/gram ointment, Apply 1 application topically Two (2) times a day. To scaly areas on feet and between toes, Disp: 30 g, Rfl: 5    nystatin (MYCOSTATIN) powder, , Disp: , Rfl:     ondansetron (ZOFRAN) 4 MG tablet, Take 4 mg by mouth., Disp: , Rfl:     oxyCODONE (ROXICODONE) 5 MG immediate release tablet, Take 1 tablet (5 mg total) by mouth every four (4) hours as needed for pain for up to 12 doses., Disp: 12 tablet, Rfl: 0    PARoxetine (PAXIL) 40 MG tablet, , Disp: , Rfl:     TRUEPLUS PEN NEEDLE 31 gauge x 1/4 (6 mm) Ndle, , Disp: , Rfl:     VICTOZA 2-PAK 0.6 mg/0.1 mL (18 mg/3 mL) injection, , Disp: , Rfl:     Allergies  Sulfacetamide sodium, Latex, and Sulfamethoxazole    Family History   Problem Relation Age of Onset    Multiple sclerosis Mother  Melanoma Neg Hx     Basal cell carcinoma Neg Hx     Squamous cell carcinoma Neg Hx        Social History  Social History     Tobacco Use    Smoking status: Never    Smokeless tobacco: Never   Substance Use Topics    Alcohol use: No    Drug use: No        Physical Exam     VITAL SIGNS:      Vitals:    10/19/22 2200 10/19/22 2204   BP:  123/91   Pulse: 94    Resp:  18   Temp:  36.4 ??C (97.5 ??F)   TempSrc:  Oral   SpO2: 100%    Weight:  (!) 108.9 kg (240 lb 1.3 oz)       Physical Exam   Constitutional: She appears healthy. No distress.   HENT:   Nose: Nose normal.   Mouth/Throat: Oropharynx is clear.   Mild right facial swelling   Eyes: Pupils are equal, round, and reactive to light. Conjunctivae are normal.   Cardiovascular: Normal rate, regular rhythm, normal heart sounds, intact distal pulses and normal pulses.   Pulmonary/Chest: Effort normal and breath sounds normal. She has no wheezes. She has no rales. She exhibits no tenderness.   Abdominal: Soft. She exhibits no distension. There is no abdominal tenderness.   No rebound or guarding   Musculoskeletal:         General: No deformity or edema.   Neurological: She is alert and oriented to person, place, and time.   Skin: Skin is warm and dry.        Radiology     CT Maxillofacial W Contrast    (Results Pending)        Laboratory Data     Lab Results   Component Value Date    WBC 9.8 10/19/2022    HGB 13.6 10/19/2022    HCT 39.9 10/19/2022    PLT 359 10/19/2022       Lab Results   Component Value Date    NA 137 09/09/2021    K 3.2 (L) 09/09/2021    CL 101 09/09/2021    CO2 25.0 09/09/2021    BUN 10 09/09/2021    CREATININE 0.84 (H) 09/09/2021    GLU 102 09/09/2021    CALCIUM 9.6 09/09/2021       Lab Results   Component Value Date BILITOT 0.7 09/09/2021    PROT 7.7 09/09/2021    ALBUMIN 4.4 09/09/2021    ALT 50 (H) 09/09/2021    AST 44 (H) 09/09/2021    ALKPHOS 86 09/09/2021       No results found for: LABPROT, INR, APTT    Pertinent labs & imaging results that were available during my care of the patient were reviewed by me and considered in my medical decision making (see chart for details).    Portions of this record have been created using Scientist, clinical (histocompatibility and immunogenetics). Dictation errors have been sought, but may not have been identified and corrected.        Documentation assistance was provided by Orion Crook, Scribe, on October 19, 2022 at 10:28 PM for Eppie Gibson, MD.     October 19, 2022 11:44 PM. Documentation assistance provided by the above mentioned scribe. I was present during the time the encounter was recorded. The information recorded by the scribe was done at my direction and has been reviewed and  validated by me.   Antionette Fairy, MD         Eppie Gibson, MD  Resident  10/19/22 [phone]

## 2022-10-20 NOTE — Unmapped (Signed)
ED Progress Note    Received sign out from previous provider.    Patient Summary: Samantha Miles is a 36 y.o. female see below  Action List:   Follow-up on CT imaging    Updates  ED Course as of 10/20/22 0541   Tue Oct 20, 2022   0005 This is a 36 year old female without notable past medical history but no need for dental extraction presenting for evaluation of facial swelling.  Patient has been on amoxicillin with worsening of the swelling.  I received signout from previous provider pending CT imaging of the face.  Patient given dose of Unasyn and plan to transition to Augmentin outpatient if CT reassuring.   0154 hCG Quant: <2.6   0453 IMPRESSION:  Mild swelling and soft tissue stranding of the right face adjacent to the posterior right mandible without focal fluid collection.   [phone] On reevaluation patient is still well-appearing, she is able to speak in full sentences.  Her work-up has been negative and without acute fluid collection.  Will discharge on amoxicillin.  Return precautions discussed with patient.   [phone] Results and decision making discussed in depth with the patient and family at beside, if available. I discussed plan to discharge the patient and the important need for follow up. They agree with plan. I discussed strict return precautions with them, which were included in my discharge instructions, and they understand and agree to come back to the Emergency Department if their symptoms are persistent for a repeat exam in 8-12 hrs, or sooner if things change/worsen. They express understanding, and patient is discharged in stable condition.

## 2022-10-21 ENCOUNTER — Other Ambulatory Visit: Payer: Self-pay | Admitting: Family Medicine

## 2022-10-21 DIAGNOSIS — N63 Unspecified lump in unspecified breast: Secondary | ICD-10-CM

## 2022-11-11 ENCOUNTER — Ambulatory Visit
Admission: RE | Admit: 2022-11-11 | Discharge: 2022-11-11 | Disposition: A | Discharge status: Home / Self Care | Payer: Medicare HMO | Source: Ambulatory Visit | Attending: Family Medicine | Admitting: Family Medicine

## 2022-11-11 DIAGNOSIS — N63 Unspecified lump in unspecified breast: Secondary | ICD-10-CM | POA: Insufficient documentation

## 2022-11-11 DIAGNOSIS — R92323 Mammographic fibroglandular density, bilateral breasts: Secondary | ICD-10-CM | POA: Insufficient documentation

## 2022-11-11 NOTE — Unmapped (Signed)
[facility] Refill Coordination Note    Specialty Medication(s) to be Shipped:   Inflammatory Disorders: Dupixent    Other medication(s) to be shipped: No additional medications requested for fill at this time     Samantha Miles, DOB: 06/25/1986  Phone: [phone] (home)       All above HIPAA information was verified with patient.     Was a Nurse, learning disability used for this call? No    Completed refill call assessment today to schedule patient's medication shipment from the [facility] [phone]).  All relevant notes have been reviewed.     Specialty medication(s) and dose(s) confirmed: Regimen is correct and unchanged.   Changes to medications: Samantha Miles reports no changes at this time.  Changes to insurance: No  New side effects reported not previously addressed with a pharmacist or physician: None reported  Questions for the pharmacist: No    Confirmed patient received a Conservation officer, historic buildings and a Surveyor, mining with first shipment. The patient will receive a drug information handout for each medication shipped and additional FDA Medication Guides as required.       DISEASE/MEDICATION-SPECIFIC INFORMATION        For patients on injectable medications: Patient currently has 0 doses left.  Next injection is scheduled for 12/24.    SPECIALTY MEDICATION ADHERENCE     Medication Adherence    Patient reported X missed doses in the last month: 0  Specialty Medication: dupixent  Patient is on additional specialty medications: No  Any gaps in refill history greater than 2 weeks in the last 3 months: no  Demonstrates understanding of importance of adherence: yes  Informant: patient  Reliability of informant: reliable              Confirmed plan for next specialty medication refill: delivery by pharmacy  Refills needed for supportive medications: not needed              Were doses missed due to medication being on hold? No     Dupixent 300 mg/2 ml: 0 on hand        REFERRAL TO PHARMACIST     Referral to the pharmacist: Not needed      [facility]     Shipping address confirmed in Epic.     Delivery Scheduled: Yes, Expected medication delivery date: 12/15.     Medication will be delivered via Same Day Courier to the prescription address in Epic WAM.    Samantha Miles   [facility] Specialty Technician

## 2022-11-13 MED FILL — DUPIXENT 300 MG/2 ML SUBCUTANEOUS PEN INJECTOR: SUBCUTANEOUS | 28 days supply | Qty: 4 | Fill #2

## 2022-12-07 ENCOUNTER — Emergency Department
Admission: EM | Admit: 2022-12-07 | Discharge: 2022-12-07 | Disposition: A | Discharge status: Home / Self Care | Payer: Medicare HMO | Attending: Emergency Medicine | Admitting: Emergency Medicine

## 2022-12-07 ENCOUNTER — Emergency Department: Payer: Medicare HMO

## 2022-12-07 ENCOUNTER — Other Ambulatory Visit: Payer: Self-pay

## 2022-12-07 DIAGNOSIS — N200 Calculus of kidney: Secondary | ICD-10-CM | POA: Insufficient documentation

## 2022-12-07 DIAGNOSIS — I10 Essential (primary) hypertension: Secondary | ICD-10-CM | POA: Insufficient documentation

## 2022-12-07 DIAGNOSIS — R112 Nausea with vomiting, unspecified: Secondary | ICD-10-CM

## 2022-12-07 DIAGNOSIS — Z79899 Other long term (current) drug therapy: Secondary | ICD-10-CM | POA: Diagnosis not present

## 2022-12-07 DIAGNOSIS — R109 Unspecified abdominal pain: Secondary | ICD-10-CM | POA: Diagnosis present

## 2022-12-07 LAB — COMPREHENSIVE METABOLIC PANEL
ALT: 13 U/L (ref 0–44)
AST: 17 U/L (ref 15–41)
Albumin: 3.9 g/dL (ref 3.5–5.0)
Alkaline Phosphatase: 71 U/L (ref 38–126)
Anion gap: 10 (ref 5–15)
BUN: 10 mg/dL (ref 6–20)
CO2: 24 mmol/L (ref 22–32)
Calcium: 8.9 mg/dL (ref 8.9–10.3)
Chloride: 103 mmol/L (ref 98–111)
Creatinine, Ser: 0.79 mg/dL (ref 0.44–1.00)
GFR, Estimated: >60 mL/min (ref >60)
Glucose, Bld: 125 mg/dL — ABNORMAL HIGH (ref 70–99)
Potassium: 3.9 mmol/L (ref 3.5–5.1)
Sodium: 137 mmol/L (ref 135–145)
Total Bilirubin: 1.5 mg/dL — ABNORMAL HIGH (ref 0.3–1.2)
Total Protein: 7.4 g/dL (ref 6.5–8.1)

## 2022-12-07 LAB — URINALYSIS, ROUTINE W REFLEX MICROSCOPIC
Bilirubin Urine: NEGATIVE
Glucose, UA: NEGATIVE mg/dL
Hgb urine dipstick: NEGATIVE
Ketones, ur: NEGATIVE mg/dL
Leukocytes,Ua: NEGATIVE
Nitrite: NEGATIVE
Protein, ur: NEGATIVE mg/dL
Specific Gravity, Urine: 1.016 (ref 1.005–1.030)
pH: 7 (ref 5.0–8.0)

## 2022-12-07 LAB — CBC WITH DIFFERENTIAL/PLATELET
Abs Immature Granulocytes: 0.04 10*3/uL (ref 0.00–0.07)
Basophils Absolute: 0 10*3/uL (ref 0.0–0.1)
Basophils Relative: 0 %
Eosinophils Absolute: 0 10*3/uL (ref 0.0–0.5)
Eosinophils Relative: 0 %
HCT: 44.7 % (ref 36.0–46.0)
Hemoglobin: 14.7 g/dL (ref 12.0–15.0)
Immature Granulocytes: 0 %
Lymphocytes Relative: 4 %
Lymphs Abs: 0.5 10*3/uL — ABNORMAL LOW (ref 0.7–4.0)
MCH: 28.8 pg (ref 26.0–34.0)
MCHC: 32.9 g/dL (ref 30.0–36.0)
MCV: 87.6 fL (ref 80.0–100.0)
Monocytes Absolute: 0.2 10*3/uL (ref 0.1–1.0)
Monocytes Relative: 2 %
Neutro Abs: 11 10*3/uL — ABNORMAL HIGH (ref 1.7–7.7)
Neutrophils Relative %: 94 %
Platelets: 361 10*3/uL (ref 150–400)
RBC: 5.1 MIL/uL (ref 3.87–5.11)
RDW: 13 % (ref 11.5–15.5)
WBC: 11.8 10*3/uL — ABNORMAL HIGH (ref 4.0–10.5)
nRBC: 0 % (ref 0.0–0.2)

## 2022-12-07 LAB — POC URINE PREG, ED: Preg Test, Ur: NEGATIVE

## 2022-12-07 LAB — LIPASE, BLOOD: Lipase: 34 U/L (ref 11–51)

## 2022-12-07 LAB — URINALYSIS WITH MICROSCOPY WITH CULTURE REFLEX
BACTERIA: NONE SEEN /HPF
BILIRUBIN UA: NEGATIVE
BLOOD UA: NEGATIVE
GLUCOSE UA: NEGATIVE
KETONES UA: NEGATIVE
LEUKOCYTE ESTERASE UA: NEGATIVE
NITRITE UA: NEGATIVE
PH UA: 6 (ref 5.0–9.0)
PROTEIN UA: 30 — AB
RBC UA: 1 /HPF (ref <=4)
SPECIFIC GRAVITY UA: 1.037 — ABNORMAL HIGH (ref 1.003–1.030)
SQUAMOUS EPITHELIAL: 1 /HPF (ref 0–5)
UROBILINOGEN UA: 2 — AB
WBC UA: 2 /HPF (ref 0–5)

## 2022-12-07 LAB — CBC W/ AUTO DIFF
BASOPHILS ABSOLUTE COUNT: 0 10*9/L (ref 0.0–0.1)
BASOPHILS RELATIVE PERCENT: 0.3 %
EOSINOPHILS ABSOLUTE COUNT: 0 10*9/L (ref 0.0–0.5)
EOSINOPHILS RELATIVE PERCENT: 0.8 %
HEMATOCRIT: 38.8 % (ref 34.0–44.0)
HEMOGLOBIN: 13.2 g/dL (ref 11.3–14.9)
LYMPHOCYTES ABSOLUTE COUNT: 1.3 10*9/L (ref 1.1–3.6)
LYMPHOCYTES RELATIVE PERCENT: 23.2 %
MEAN CORPUSCULAR HEMOGLOBIN CONC: 33.9 g/dL (ref 32.0–36.0)
MEAN CORPUSCULAR HEMOGLOBIN: 29.5 pg (ref 25.9–32.4)
MEAN CORPUSCULAR VOLUME: 86.8 fL (ref 77.6–95.7)
MEAN PLATELET VOLUME: 7.8 fL (ref 6.8–10.7)
MONOCYTES ABSOLUTE COUNT: 0.3 10*9/L (ref 0.3–0.8)
MONOCYTES RELATIVE PERCENT: 6.2 %
NEUTROPHILS ABSOLUTE COUNT: 3.9 10*9/L (ref 1.8–7.8)
NEUTROPHILS RELATIVE PERCENT: 69.5 %
NUCLEATED RED BLOOD CELLS: 0 /100{WBCs} (ref <=4)
PLATELET COUNT: 287 10*9/L (ref 150–450)
RED BLOOD CELL COUNT: 4.47 10*12/L (ref 3.95–5.13)
RED CELL DISTRIBUTION WIDTH: 13.9 % (ref 12.2–15.2)
WBC ADJUSTED: 5.5 10*9/L (ref 3.6–11.2)

## 2022-12-07 MED ORDER — ONDANSETRON HCL 4 MG/2ML IJ SOLN
4.0000 mg | Freq: Once | INTRAMUSCULAR | Status: AC
  Administered 2022-12-07: 4 mg via INTRAVENOUS
  Filled 2022-12-07: qty 2

## 2022-12-07 MED ORDER — MORPHINE SULFATE (PF) 4 MG/ML IV SOLN
4.0000 mg | Freq: Once | INTRAVENOUS | Status: AC
  Administered 2022-12-07: 4 mg via INTRAVENOUS
  Filled 2022-12-07: qty 1

## 2022-12-07 MED ORDER — SODIUM CHLORIDE 0.9 % IV BOLUS (SEPSIS)
1000.0000 mL | Freq: Once | INTRAVENOUS | Status: AC
  Administered 2022-12-07: 1000 mL via INTRAVENOUS

## 2022-12-07 MED ORDER — IBUPROFEN 800 MG PO TABS: 800.0000 mg | ORAL_TABLET | Freq: Three times a day (TID) | ORAL | 0 refills | Status: DC | PRN

## 2022-12-07 MED ORDER — ONDANSETRON 4 MG PO TBDP: 4.0000 mg | ORAL_TABLET | Freq: Four times a day (QID) | ORAL | 0 refills | Status: DC | PRN

## 2022-12-07 MED ORDER — KETOROLAC TROMETHAMINE 30 MG/ML IJ SOLN
30.0000 mg | Freq: Once | INTRAMUSCULAR | Status: AC
  Administered 2022-12-07: 30 mg via INTRAVENOUS
  Filled 2022-12-07: qty 1

## 2022-12-07 NOTE — ED Triage Notes (Addendum)
Pt arrives via POV with CC of L side flank pain that began at 0200 this am. Pt reports  Reports nausea with 6-7 episodes of vomiting since pain began. Denies dysuria and diarrhea. Reports hx of kidney stones.

## 2022-12-07 NOTE — Discharge Instructions (Signed)
Your urine showed no sign of infection.  Your CT scan showed no acute abnormality including no kidney stones.   You may alternate Tylenol 1000 mg every 6 hours as needed for pain, fever and Ibuprofen 800 mg every 6-8 hours as needed for pain, fever.  Please take Ibuprofen with food.  Do not take more than 4000 mg of Tylenol (acetaminophen) in a 24 hour period.

## 2022-12-07 NOTE — ED Provider Notes (Signed)
[facility] Provider Note    Event Date/Time   First MD Initiated Contact with Patient 12/07/22 [phone]     (approximate)   History   Flank Pain   HPI  Brittney Sanders is a 37 y.o. female with history of kidney stones, depression, eczema who presents emergency department with left flank pain that started tonight with multiple episodes of vomiting.  No fevers, abdominal pain, diarrhea, dysuria or hematuria.  No injury to her back.  States feels similar to her previous kidney stones.   History provided by patient.    Past Medical History:  Diagnosis Date   Depression    Eczema    GERD (gastroesophageal reflux disease)    Hearing deficit    Heel spur    History of kidney stones    History of methicillin resistant staphylococcus aureus (MRSA) 2021   Hypertension    Nocturia    Obesity    Overweight    Plantar fasciitis    Seasonal allergies    UTI (lower urinary tract infection)     Past Surgical History:  Procedure Laterality Date   ESOPHAGOGASTRODUODENOSCOPY (EGD) WITH PROPOFOL N/A 02/12/2022   Procedure: ESOPHAGOGASTRODUODENOSCOPY (EGD) WITH PROPOFOL;  Surgeon: Annamaria Helling, DO;  Location: [facility] ENDOSCOPY;  Service: Gastroenterology;  Laterality: N/A;   TUBAL LIGATION     TYMPANOSTOMY TUBE PLACEMENT Right    VAGINAL HYSTERECTOMY N/A 05/21/2022   Procedure: HYSTERECTOMY VAGINAL;  Surgeon: Ouida Sills Gwen Her, MD;  Location: [facility] ORS;  Service: Gynecology;  Laterality: N/A;    MEDICATIONS:  Prior to Admission medications   Medication Sig Start Date End Date Taking? Authorizing Provider  clotrimazole-betamethasone (LOTRISONE) cream Apply 1 application. topically 2 (two) times daily. Patient taking differently: Apply 1 application  topically as needed. 02/03/22   Felipa Furnace, DPM  DUPIXENT 300 MG/2ML prefilled syringe 300 mg every 14 (fourteen) days. 10/03/19   [provider]  famotidine (PEPCID) 20 MG tablet Take 1 tablet (20  mg total) by mouth 2 (two) times daily. 02/24/22   Paulette Blanch, MD  fexofenadine (ALLEGRA) 180 MG tablet Take 180 mg by mouth every morning.    [provider]  ketorolac (TORADOL) 10 MG tablet Take 1 tablet (10 mg total) by mouth every 6 (six) hours as needed. 06/14/22   Naaman Plummer, MD  liraglutide (VICTOZA) 18 MG/3ML SOPN Inject 0.6 mg into the skin daily. TAKES FOR WEIGHT LOSS    [provider]  lisinopril (ZESTRIL) 10 MG tablet Take 10 mg by mouth every morning.    [provider]  magic mouthwash (lidocaine, diphenhydrAMINE, alum & mag hydroxide) suspension Swish and swallow 5 mLs 4 (four) times daily as needed for mouth pain. 11/19/21   Hughie Closs, PA-C  metoCLOPramide (REGLAN) 10 MG tablet Take 1 tablet (10 mg total) by mouth every 6 (six) hours as needed. 10/13/19   Carrie Mew, MD  naproxen (NAPROSYN) 500 MG tablet Take 1 tablet (500 mg total) by mouth 2 (two) times daily with a meal. 02/16/22   Lavonia Drafts, MD  nortriptyline (PAMELOR) 10 MG capsule  10/02/19   [provider]  nystatin (MYCOSTATIN/NYSTOP) powder 1 application  as needed. Under breasts 05/16/16   [provider]  ondansetron (ZOFRAN ODT) 4 MG disintegrating tablet Take 1 tablet (4 mg total) by mouth every 8 (eight) hours as needed for nausea or vomiting. 10/21/19   Paulette Blanch, MD  pantoprazole (PROTONIX) 40 MG tablet Take  1 tablet (40 mg total) by mouth daily. Patient taking differently: Take 40 mg by mouth every morning. 11/19/21 05/13/22  Rushie Chestnut, PA-C  phentermine 37.5 MG capsule Take 37.5 mg by mouth every morning.    [provider]    Physical Exam   Triage Vital Signs: ED Triage Vitals [12/07/22 0540]  Enc Vitals Group     BP 110/81     Pulse Rate (!) 115     Resp 19     Temp 98.2 F (36.8 C)     Temp Source Oral     SpO2 99 %     Weight 240 lb (108.9 kg)     Height 5\' 3"  (1.6 m)     Head Circumference      Peak Flow       Pain Score 10     Pain Loc      Pain Edu?      Excl. in GC?     Most recent vital signs: Vitals:   12/07/22 0540 12/07/22 0648  BP: 110/81 120/86  Pulse: (!) 115 (!) 101  Resp: 19 20  Temp: 98.2 F (36.8 C)   SpO2: 99% 97%    CONSTITUTIONAL: Alert and oriented and responds appropriately to questions. Well-appearing; well-nourished HEAD: Normocephalic, atraumatic EYES: Conjunctivae clear, pupils appear equal, sclera nonicteric ENT: normal nose; moist mucous membranes NECK: Supple, normal ROM CARD: Regular and tachycardic; S1 and S2 appreciated; no murmurs, no clicks, no rubs, no gallops RESP: Normal chest excursion without splinting or tachypnea; breath sounds clear and equal bilaterally; no wheezes, no rhonchi, no rales, no hypoxia or respiratory distress, speaking full sentences ABD/GI: Normal bowel sounds; non-distended; soft, non-tender, no rebound, no guarding, no peritoneal signs BACK: The back appears normal, no midline spinal tenderness, step-off or deformity, no CVA tenderness EXT: Normal ROM in all joints; no deformity noted, no edema; no cyanosis SKIN: Normal color for age and race; warm; no rash on exposed skin NEURO: Moves all extremities equally, normal speech PSYCH: The patient's mood and manner are appropriate.   ED Results / Procedures / Treatments   LABS: (all labs ordered are listed, but only abnormal results are displayed) Labs Reviewed  URINALYSIS, ROUTINE W REFLEX MICROSCOPIC - Abnormal; Notable for the following components:      Result Value   Color, Urine YELLOW (*)    APPearance CLEAR (*)    All other components within normal limits  CBC WITH DIFFERENTIAL/PLATELET  COMPREHENSIVE METABOLIC PANEL  LIPASE, BLOOD  POC URINE PREG, ED     EKG:  RADIOLOGY: My personal review and interpretation of imaging: CT scan shows no acute abnormality.  I have personally reviewed all radiology reports.   CT Renal Stone Study  Result Date:  12/07/2022 CLINICAL DATA:  Left-sided flank pain beginning at 2 a.m. EXAM: CT ABDOMEN AND PELVIS WITHOUT CONTRAST TECHNIQUE: Multidetector CT imaging of the abdomen and pelvis was performed following the standard protocol without IV contrast. RADIATION DOSE REDUCTION: This exam was performed according to the departmental dose-optimization program which includes automated exposure control, adjustment of the mA and/or kV according to patient size and/or use of iterative reconstruction technique. COMPARISON:  09/17/2022 FINDINGS: Lower chest:  No contributory findings. Hepatobiliary: No focal liver abnormality.No evidence of biliary obstruction or stone. Pancreas: Unremarkable. Spleen: Unremarkable. Adrenals/Urinary Tract: Negative adrenals. No hydronephrosis or ureteral stone. Renal cortical scarring asymmetric to the left where there is extensive lobulation and cortex thinning. Left lower pole calculus measuring 6 mm.  Punctate left upper pole renal calculi. Unremarkable bladder. Stomach/Bowel: Some fluid-filled small bowel loops but no over distension or transition point. No bowel inflammation seen; no appendicitis when compared to prior. Vascular/Lymphatic: No acute vascular abnormality. No mass or adenopathy. Reproductive:Hysterectomy.  Physiologic appearance of the adnexa. Other: No ascites or pneumoperitoneum. Musculoskeletal: No acute abnormalities. IMPRESSION: 1. No acute finding.  No hydronephrosis or ureteral calculus. 2. Left nephrolithiasis and multifocal cortical scarring. Electronically Signed   By: Tiburcio Pea M.D.   On: 12/07/2022 06:37     PROCEDURES:  Critical Care performed: No      Procedures    IMPRESSION / MDM / ASSESSMENT AND PLAN / ED COURSE  I reviewed the triage vital signs and the nursing notes.    Patient here with left flank pain, vomiting.   DIFFERENTIAL DIAGNOSIS (includes but not limited to):   Kidney stone, UTI, musculoskeletal back pain, gastroenteritis,  colitis   Patient's presentation is most consistent with acute complicated illness / injury requiring diagnostic workup.   PLAN: Will obtain CBC, CMP, urinalysis, CT renal study.  Will give IV fluids, pain and nausea medicine.   MEDICATIONS GIVEN IN ED: Medications  sodium chloride 0.9 % bolus 1,000 mL (1,000 mLs Intravenous New Bag/Given 12/07/22 0640)  ketorolac (TORADOL) 30 MG/ML injection 30 mg (30 mg Intravenous Given 12/07/22 0640)  morphine (PF) 4 MG/ML injection 4 mg (4 mg Intravenous Given 12/07/22 0640)  ondansetron (ZOFRAN) injection 4 mg (4 mg Intravenous Given 12/07/22 0640)     ED COURSE: CT scan reviewed and interpreted by myself and radiologist and shows no acute abnormality.  Urine shows no sign of infection.  Labs pending.  Signed out to oncoming ED physician.   CONSULTS:  pending further workup anticipate discharge home   OUTSIDE RECORDS REVIEWED: Reviewed patient's last note with OB/GYN on 06/05/2022.       FINAL CLINICAL IMPRESSION(S) / ED DIAGNOSES   Final diagnoses:  Left flank pain  Nausea and vomiting in adult     Rx / DC Orders   ED Discharge Orders          Ordered    ondansetron (ZOFRAN-ODT) 4 MG disintegrating tablet  Every 6 hours PRN        12/07/22 0653    ibuprofen (ADVIL) 800 MG tablet  Every 8 hours PRN        12/07/22 0653             Note:  This document was prepared using Dragon voice recognition software and may include unintentional dictation errors.   Derica Leiber, Layla Maw, DO 12/07/22 [phone]

## 2022-12-08 ENCOUNTER — Ambulatory Visit: Admit: 2022-12-08 | Discharge: 2022-12-08 | Discharge status: Against Medical Advice | Payer: MEDICARE

## 2022-12-08 LAB — COMPREHENSIVE METABOLIC PANEL
ALBUMIN: 3.4 g/dL (ref 3.4–5.0)
ALKALINE PHOSPHATASE: 70 U/L (ref 46–116)
ALT (SGPT): 11 U/L (ref 10–49)
ANION GAP: 8 mmol/L (ref 5–14)
AST (SGOT): 11 U/L (ref <=34)
BILIRUBIN TOTAL: 0.9 mg/dL (ref 0.3–1.2)
BLOOD UREA NITROGEN: 9 mg/dL (ref 9–23)
BUN / CREAT RATIO: 13
CALCIUM: 8.9 mg/dL (ref 8.7–10.4)
CHLORIDE: 109 mmol/L — ABNORMAL HIGH (ref 98–107)
CO2: 21.7 mmol/L (ref 20.0–31.0)
CREATININE: 0.72 mg/dL
EGFR CKD-EPI (2021) FEMALE: >90 mL/min/{1.73_m2} (ref >=60)
GLUCOSE RANDOM: 111 mg/dL (ref 70–179)
POTASSIUM: 3.2 mmol/L — ABNORMAL LOW (ref 3.4–4.8)
PROTEIN TOTAL: 6.3 g/dL (ref 5.7–8.2)
SODIUM: 139 mmol/L (ref 135–145)

## 2022-12-08 NOTE — Unmapped (Signed)
Pt endorses left flank pain since this AM. Denies any urinary symptoms  Some N/V   Feels different than a kidney stone.

## 2022-12-09 ENCOUNTER — Ambulatory Visit
Admit: 2022-12-09 | Discharge: 2022-12-10 | Disposition: A | Discharge status: Home / Self Care | Payer: MEDICARE | Attending: Emergency Medicine

## 2022-12-09 LAB — COMPREHENSIVE METABOLIC PANEL
ALBUMIN: 3.7 g/dL (ref 3.4–5.0)
ALKALINE PHOSPHATASE: 74 U/L (ref 46–116)
ALT (SGPT): 13 U/L (ref 10–49)
ANION GAP: 9 mmol/L (ref 5–14)
AST (SGOT): 20 U/L (ref <=34)
BILIRUBIN TOTAL: 0.4 mg/dL (ref 0.3–1.2)
BLOOD UREA NITROGEN: 9 mg/dL (ref 9–23)
BUN / CREAT RATIO: 14
CALCIUM: 8.8 mg/dL (ref 8.7–10.4)
CHLORIDE: 109 mmol/L — ABNORMAL HIGH (ref 98–107)
CO2: 23 mmol/L (ref 20.0–31.0)
CREATININE: 0.66 mg/dL
EGFR CKD-EPI (2021) FEMALE: >90 mL/min/{1.73_m2} (ref >=60)
GLUCOSE RANDOM: 84 mg/dL (ref 70–179)
POTASSIUM: 3.3 mmol/L — ABNORMAL LOW (ref 3.4–4.8)
PROTEIN TOTAL: 6.9 g/dL (ref 5.7–8.2)
SODIUM: 141 mmol/L (ref 135–145)

## 2022-12-09 LAB — CBC W/ AUTO DIFF
BASOPHILS ABSOLUTE COUNT: 0 10*9/L (ref 0.0–0.1)
BASOPHILS RELATIVE PERCENT: 0.3 %
EOSINOPHILS ABSOLUTE COUNT: 0.1 10*9/L (ref 0.0–0.5)
EOSINOPHILS RELATIVE PERCENT: 0.7 %
HEMATOCRIT: 40.6 % (ref 34.0–44.0)
HEMOGLOBIN: 13.9 g/dL (ref 11.3–14.9)
LYMPHOCYTES ABSOLUTE COUNT: 1.8 10*9/L (ref 1.1–3.6)
LYMPHOCYTES RELATIVE PERCENT: 13.6 %
MEAN CORPUSCULAR HEMOGLOBIN CONC: 34.3 g/dL (ref 32.0–36.0)
MEAN CORPUSCULAR HEMOGLOBIN: 29.1 pg (ref 25.9–32.4)
MEAN CORPUSCULAR VOLUME: 84.9 fL (ref 77.6–95.7)
MEAN PLATELET VOLUME: 8.2 fL (ref 6.8–10.7)
MONOCYTES ABSOLUTE COUNT: 0.5 10*9/L (ref 0.3–0.8)
MONOCYTES RELATIVE PERCENT: 3.8 %
NEUTROPHILS ABSOLUTE COUNT: 10.7 10*9/L — ABNORMAL HIGH (ref 1.8–7.8)
NEUTROPHILS RELATIVE PERCENT: 81.6 %
PLATELET COUNT: 348 10*9/L (ref 150–450)
RED BLOOD CELL COUNT: 4.78 10*12/L (ref 3.95–5.13)
RED CELL DISTRIBUTION WIDTH: 13.9 % (ref 12.2–15.2)
WBC ADJUSTED: 13.1 10*9/L — ABNORMAL HIGH (ref 3.6–11.2)

## 2022-12-09 LAB — URINALYSIS WITH MICROSCOPY WITH CULTURE REFLEX
BACTERIA: NONE SEEN /HPF
BILIRUBIN UA: NEGATIVE
BLOOD UA: NEGATIVE
GLUCOSE UA: NEGATIVE
KETONES UA: 10 — AB
NITRITE UA: NEGATIVE
PH UA: 7 (ref 5.0–9.0)
PROTEIN UA: NEGATIVE
RBC UA: <1 /HPF (ref <=4)
SPECIFIC GRAVITY UA: 1.02 (ref 1.003–1.030)
SQUAMOUS EPITHELIAL: 3 /HPF (ref 0–5)
UROBILINOGEN UA: <2
WBC UA: 2 /HPF (ref 0–5)

## 2022-12-09 LAB — LIPASE: LIPASE: 32 U/L (ref 12–53)

## 2022-12-09 LAB — HCG QUANTITATIVE, BLOOD: GONADOTROPIN, CHORIONIC (HCG) QUANT: <2.6 m[IU]/mL

## 2022-12-09 NOTE — Unmapped (Signed)
Pt reports bilateral CP since Monday. Pt reports it feels as if someone is squeezing your chest. Pt reports nausea and vomiting. Pt pain radiates to lower back and lower R ABD.

## 2022-12-09 NOTE — Unmapped (Addendum)
Pt here with CP, N/V, and flank pains. Symptoms started this morning. Pt has picture of urine and it is dark amber in color. Seen at [facility] last week for same and found nothing. Went to [facility] on Tuesday but left because of wait time.

## 2022-12-09 NOTE — Unmapped (Signed)
Patient continually awaiting the room to see doctor. Patient sitting in the waiting room. Family sitting with patient together.    Redrawn lab and resent. Rechecking pt BP 119/84.

## 2022-12-10 MED ADMIN — ketorolac (TORADOL) injection 60 mg: 60 mg | INTRAMUSCULAR | @ 05:00:00 | Stop: 2022-12-09

## 2022-12-10 MED ADMIN — lidocaine 4 % patch 1 patch: 1 | TRANSDERMAL | @ 05:00:00

## 2022-12-10 NOTE — Unmapped (Signed)
[facility]  Emergency Department Provider Note     ED Clinical Impression     Final diagnoses:   Flank pain (Primary)        Impression, Medical Decision Making, ED Course     11:00 PM   Impression: 37 y.o. female with a PMH of nephrolithiasis who presents with left flank pain for 3 days.  Vital signs within normal limits upon arrival.    DDx/MDM: Left flank pain for 3 days and patient with a history of previous nephrolithiasis.  Patient was evaluated outside hospital 2 days prior in which she had a CT scan that did not show renal calculi at that time.  Differential diagnosis includes urinary tract infection, pyelonephritis, musculoskeletal, less likely renal calculi given recent reassuring CT scan, viral illness, dehydration.    Plan for ECG, basic labs, hCG, lipase level. Will treat patient with Toradol while round patches..      ED Course as of 12/10/22 0150   Thu Dec 10, 2022   0150 WBC(!): 13.1   0150 Leukocyte Esterase, UA(!): Trace   0150 Ketones, UA(!): 10 mg/dL   1610 Potassium(!): 3.3   0150 Sodium: 141   0150 On reassessment, patient reports feeling improved.  She has been able to tolerate p.o. in the emergency department without difficulty.  Plan to discharge with instructions to continue ibuprofen and Tylenol as directed.  Continue to drink plenty of fluids.  Strict return precautions reviewed.  Patient and family expressed understanding and are agreeable plan.         ____________________________________________    The case was discussed with the attending physician, who is in agreement with the above assessment and plan.      History     Chief Complaint  Chief Complaint   Patient presents with    Chest Pain    Flank Pain    Emesis       HPI   Samantha Miles is a 37 y.o. female with past medical history as below who presents with chest pain. The patient endorses 3 days of constant bilateral chest pain that radiates to her lower back and lower left abdomen with associated nausea and emesis (last episode this morning). She notes that her pain does not worsen with positional movement. The patient notes that her last BM was yesterday. She reports taking Ibuprofen without symptom relief. The patient reports being compliant with her medication regimen. She denies any recent travel or any periods of long immobility. The patient denies dysuria, hematuria, melena, hematochezia, constipation, or diarrhea.      Per chart review, the patient was evaluated 2 days ago at [facility] ED for evaluation of her left flank pain. At that time, her urinalysis and CT scan were both largely unremarkable and showed no acute abnormalities or kidney stones. She was ultimately discharged with instructions to alternate Tylenol and Ibuprofen PRN.     Outside Historian(s): I have obtained additional history/collateral from family at bedside.    External Records Reviewed: I have reviewed 12/07/2022 [facility] ED Visit with [facility], DO for evaluation of left flank pain where the ED Notes were reviewed for the HPI and PMH.    Past Medical History:   Diagnosis Date    Depression     Eczema        Past Surgical History:   Procedure Laterality Date    INNER EAR SURGERY      MIDDLE EAR SURGERY  TUBAL LIGATION           Current Facility-Administered Medications:     lidocaine 4 % patch 1 patch, 1 patch, Transdermal, Once, Irena Gaydos E, MD, 1 patch at 12/09/22 2342    Current Outpatient Medications:     betamethasone dipropionate (DIPROLENE) 0.05 % ointment, betamethasone dipropionate 0.05 % topical ointment, Disp: , Rfl:     betamethasone, augmented, (DIPROLENE) 0.05 % cream, Apply twice daily to areas of eczema until flat and smooth. Avoid face and skin folds, Disp: 50 g, Rfl: 5    clobetasoL (TEMOVATE) 0.05 % ointment, Apply to eczema on hands at night until resolved., Disp: 60 g, Rfl: 5    dupilumab (DUPIXENT PEN) 300 mg/2 mL PnIj, Inject the contents of 1 pen (300 mg total) under the skin every fourteen (14) days., Disp: 4 mL, Rfl: 7    empty container Misc, Use as directed to dispose of Dupixent syringes., Disp: 1 each, Rfl: 2    fexofenadine (ALLEGRA) 180 MG tablet, Take 180 mg by mouth daily., Disp: , Rfl: 11    guaiFENesin 200 mg tablet, Take 2 tablets (400 mg total) by mouth every six (6) hours as needed for congestion., Disp: 30 tablet, Rfl: 0    hydroCHLOROthiazide (HYDRODIURIL) 12.5 MG tablet, Take 12.5 mg by mouth daily., Disp: , Rfl:     ibuprofen (ADVIL,MOTRIN) 800 MG tablet, TAKE 1 TABLET BY MOUTH EVERY 8 HOURS AS NEEDED FOR MODERATE PAIN, Disp: , Rfl: 0    lidocaine 2% viscous (LIDOCAINE) 2 % Soln, 10 mL by Mouth route every three (3) hours as needed (sore throat)., Disp: 120 mL, Rfl: 0    meloxicam (MOBIC) 15 MG tablet, Mobic 15 mg tablet  Take 1 tablet(s) every day by oral route., Disp: , Rfl:     nystatin (MYCOSTATIN) 100,000 unit/gram ointment, Apply 1 application topically Two (2) times a day. To scaly areas on feet and between toes, Disp: 30 g, Rfl: 5    nystatin (MYCOSTATIN) powder, , Disp: , Rfl:     ondansetron (ZOFRAN) 4 MG tablet, Take 4 mg by mouth., Disp: , Rfl:     oxyCODONE (ROXICODONE) 5 MG immediate release tablet, Take 1 tablet (5 mg total) by mouth every four (4) hours as needed for pain for up to 12 doses., Disp: 12 tablet, Rfl: 0    PARoxetine (PAXIL) 40 MG tablet, , Disp: , Rfl:     TRUEPLUS PEN NEEDLE 31 gauge x 1/4 (6 mm) Ndle, , Disp: , Rfl:     VICTOZA 2-PAK 0.6 mg/0.1 mL (18 mg/3 mL) injection, , Disp: , Rfl:     Allergies  Sulfacetamide sodium, Latex, and Sulfamethoxazole    Family History  Family History   Problem Relation Age of Onset    Multiple sclerosis Mother     Melanoma Neg Hx     Basal cell carcinoma Neg Hx     Squamous cell carcinoma Neg Hx        Social History  Social History     Tobacco Use    Smoking status: Never    Smokeless tobacco: Never   Substance Use Topics    Alcohol use: No    Drug use: No        Physical Exam     VITAL SIGNS:      Vitals:    12/09/22 1042 12/09/22 1422 12/09/22 1847 12/10/22 0045   BP:  119/84 110/70 98/77   Pulse: 96 91  75  Resp: 18 18 20 16    Temp: 36.9 ??C (98.4 ??F) 36.9 ??C (98.4 ??F) 36.8 ??C (98.2 ??F)    TempSrc: Oral Oral Oral    SpO2: 100% 100% 99% 97%   Weight:       Height:           Constitutional: Alert and oriented. No acute distress.  Laying comfortably in bed.  Eyes: Conjunctivae are normal.  HEENT: Normocephalic and atraumatic. Conjunctivae clear. No congestion. Moist mucous membranes.   Cardiovascular: Rate as above, regular rhythm. Normal and symmetric distal pulses. Brisk capillary refill. Normal skin turgor.  Respiratory: Normal respiratory effort. Breath sounds are normal. There are no wheezing or crackles heard.  Gastrointestinal: Soft, non-distended, non-tender.  Genitourinary: Deferred.  Musculoskeletal: Non-tender with normal range of motion in all extremities.  Neurologic: Normal speech and language. No gross focal neurologic deficits are appreciated. Patient is moving all extremities equally, face is symmetric at rest and with speech.  Skin: Skin is warm, dry and intact. No rash noted.  Psychiatric: Mood and affect are normal. Speech and behavior are normal.     Radiology     No orders to display       Pertinent labs & imaging results that were available during my care of the patient were independently interpreted by me and considered in my medical decision making (see chart for details).    Portions of this record have been created using Scientist, clinical (histocompatibility and immunogenetics). Dictation errors have been sought, but may not have been identified and corrected.    Documentation assistance was provided by Chyrel Masson, Scribe, on December 09, 2022 at 11:00 PM for Loraine Maple, MD.   Documentation assistance provided by the scribe. I was present during the time the encounter was recorded. The information recorded by the scribe was done at my direction and has been reviewed and validated by me.     Jobe Marker, MD  Resident  12/10/22 [phone]

## 2022-12-18 NOTE — Unmapped (Signed)
[facility] Refill Coordination Note    Specialty Medication(s) to be Shipped:   Inflammatory Disorders: Dupixent    Other medication(s) to be shipped: No additional medications requested for fill at this time     Samantha Miles, DOB: 1985/12/03  Phone: [phone] (home)       All above HIPAA information was verified with patient.     Was a Nurse, learning disability used for this call? No    Completed refill call assessment today to schedule patient's medication shipment from the [facility] [phone]).  All relevant notes have been reviewed.     Specialty medication(s) and dose(s) confirmed: Regimen is correct and unchanged.   Changes to medications: Gali reports no changes at this time.  Changes to insurance: No  New side effects reported not previously addressed with a pharmacist or physician: None reported  Questions for the pharmacist: No    Confirmed patient received a Conservation officer, historic buildings and a Surveyor, mining with first shipment. The patient will receive a drug information handout for each medication shipped and additional FDA Medication Guides as required.       DISEASE/MEDICATION-SPECIFIC INFORMATION        For patients on injectable medications: Patient currently has 0 doses left.  Next injection is scheduled for 12/27/2022.    SPECIALTY MEDICATION ADHERENCE     Medication Adherence    Patient reported X missed doses in the last month: 0  Specialty Medication: DUPIXENT PEN 300 mg/2 mL  Patient is on additional specialty medications: No                                Were doses missed due to medication being on hold? No    Dupixent 300/2 mg/ml: 0 days of medicine on hand         REFERRAL TO PHARMACIST     Referral to the pharmacist: Not needed      [facility]     Shipping address confirmed in Epic.     Delivery Scheduled: Yes, Expected medication delivery date: 12/24/2022.     Medication will be delivered via UPS to the prescription address in Epic WAM.    [name]   [facility] Specialty Technician

## 2022-12-24 MED FILL — DUPIXENT 300 MG/2 ML SUBCUTANEOUS PEN INJECTOR: SUBCUTANEOUS | 28 days supply | Qty: 4 | Fill #3

## 2023-01-07 NOTE — Unmapped (Signed)
LWBS/AMA Patient Follow-Up Call  Chart reviewed after patient LWBS after triage. Patient has since been evaluated by [facility] on another visit, per chart documentation. No further actions needed.

## 2023-01-19 NOTE — Unmapped (Signed)
[facility] Refill Coordination Note    Specialty Medication(s) to be Shipped:   Inflammatory Disorders: Dupixent    Other medication(s) to be shipped: No additional medications requested for fill at this time     Samantha Miles, DOB: 1985-12-07  Phone: [phone] (home)       All above HIPAA information was verified with patient.     Was a Nurse, learning disability used for this call? No    Completed refill call assessment today to schedule patient's medication shipment from the [facility] [phone]).  All relevant notes have been reviewed.     Specialty medication(s) and dose(s) confirmed: Regimen is correct and unchanged.   Changes to medications: Solaris reports no changes at this time.  Changes to insurance: No  New side effects reported not previously addressed with a pharmacist or physician: None reported  Questions for the pharmacist: No    Confirmed patient received a Conservation officer, historic buildings and a Surveyor, mining with first shipment. The patient will receive a drug information handout for each medication shipped and additional FDA Medication Guides as required.       DISEASE/MEDICATION-SPECIFIC INFORMATION        For patients on injectable medications: Patient currently has 1 doses left.  Next injection is scheduled for 2/25.    SPECIALTY MEDICATION ADHERENCE     Medication Adherence    Patient reported X missed doses in the last month: 0  Specialty Medication: Dupixent  Patient is on additional specialty medications: No  Informant: patient              Were doses missed due to medication being on hold? No    Dupixent 300/2 mg/ml: 0 days of medicine on hand       REFERRAL TO PHARMACIST     Referral to the pharmacist: Not needed      [facility]     Shipping address confirmed in Epic.     Patient was notified of new phone menu : Yes: .    Delivery Scheduled: Yes, Expected medication delivery date: 2/23.     Medication will be delivered via Same Day Courier to the prescription address in Epic WAM.    Samantha Miles   [facility] Specialty Technician

## 2023-01-22 NOTE — Unmapped (Signed)
Samantha Miles 's Dupixent shipment will be delayed as a result of insurance processor is down.     I have reached out to the patient  at [phone] and communicated the delivery change. We will reschedule the medication for the delivery date that the patient agreed upon.  We have confirmed the delivery date as 3/1, via same day courier.

## 2023-01-29 DIAGNOSIS — L209 Atopic dermatitis, unspecified: Principal | ICD-10-CM

## 2023-01-29 MED FILL — DUPIXENT 300 MG/2 ML SUBCUTANEOUS PEN INJECTOR: SUBCUTANEOUS | 28 days supply | Qty: 4 | Fill #4

## 2023-02-01 ENCOUNTER — Ambulatory Visit: Admit: 2023-02-01 | Discharge: 2023-02-02 | Payer: MEDICARE

## 2023-02-01 DIAGNOSIS — L719 Rosacea, unspecified: Principal | ICD-10-CM

## 2023-02-01 DIAGNOSIS — L209 Atopic dermatitis, unspecified: Principal | ICD-10-CM

## 2023-02-01 MED ORDER — BETAMETHASONE DIPROPIONATE 0.05 % TOPICAL CREAM
TOPICAL | 3 refills | 0.00000 days | Status: CP
Start: 2023-02-01 — End: ?

## 2023-02-01 MED ORDER — DUPILUMAB 300 MG/2 ML SUBCUTANEOUS PEN INJECTOR
SUBCUTANEOUS | 11 refills | 28.00000 days | Status: CP
Start: 2023-02-01 — End: ?
  Filled 2023-02-25: qty 4, 28d supply, fill #0

## 2023-02-01 MED ORDER — METRONIDAZOLE 0.75 % TOPICAL CREAM
Freq: Two times a day (BID) | TOPICAL | 5 refills | 0.00000 days | Status: CP
Start: 2023-02-01 — End: ?

## 2023-02-01 NOTE — Unmapped (Signed)
Dermatology Note     Assessment and Plan:      Rosacea, papulopustular and erythematotelangiectatic types  - Diagnosis, prognosis, treatment options including prescription medications, risks/benefits, and side effects were discussed with the patient. The patient made the informed decision to treat as orders below.  - start metroNIDAZOLE (METROCREAM) 0.75 % cream; Apply topically two (2) times a day. Apply to face.  - start over-the-counter azelaic acid. Okay to mix with metronidazole cream.  - if no improvement, will consider escalating to compounded rosacea triple cream with ivermectin  - recommend daily sunscreen; cannot provide DMV letter for tinted widows.    Atopic dermatitis, chronic, well controlled on Dupixent (since 05/2019)  - significantly improved since being on dupilumab therapy   - continue - dupilumab 300 mg/2 mL PnIj; Inject 300 mg under the skin every fourteen (14) days.  - betamethasone dipropionate 0.05 % cream; Apply twice a day to eczema flare until the skin feels smooth, then stop. Do not apply to the skin on the face, underarms or groin.  -continue gentle skin care   - continue use of emollient moisturizer    The patient was advised to call for an appointment should any new, changing, or symptomatic lesions develop.     RTC: Return in about 3 months (around 05/04/2023) for In-person. or sooner as needed   _________________________________________________________________      Chief Complaint     Chief Complaint   Patient presents with    Medication Refill     Dupixent refill       HPI     Samantha Miles is a 37 y.o. female who presents as a returning patient (last seen No previous visit found) to Dermatology for follow up of eczema on dupixent and rosacea. She needs refills for dupixent and would like to start treatment for rosacea.     The patient denies any other new or changing lesions or areas of concern.     Pertinent Past Medical History     Problem List    None    Past Medical History, Family History, Social History, Medication List, Allergies, and Problem List were reviewed in the rooming section of Epic.     ROS: Other than symptoms mentioned in the HPI, no fevers, chills, or other skin complaints    Physical Examination     GENERAL: Well-appearing female in no acute distress, resting comfortably.  NEURO: Alert and oriented, answers questions appropriately  PSYCH: Normal mood and affect  RESP: No increased work of breathing  SKIN (Focal Skin Exam): Per patient request, examination of face and arms was performed  - Moderate erythema and telangiectasias as well as scattered erythematous papules and pustules involving the central face and chin.  Lid margins are not inflamed    All areas not commented on are within normal limits or unremarkable    (Approved Template 08/12/2020)

## 2023-02-01 NOTE — Unmapped (Addendum)
Over-the-counter alternative for azelaic acid 10% cream:   - Examples: Product by [employer]  - Instructions: Start over-the-counter azelaic acid 10% cream by [employer]: Apply a fingertip amount to the face once daily in the morning. Follow with use of sunscreen.  - Instrucciones: Aplicar una capa fina de crema sobre la cara una vez al d??a por la ma??ana. Favor de Warden/ranger.

## 2023-02-02 NOTE — Unmapped (Signed)
Addended by: Inis Sizer A on: 02/01/2023 08:37 PM     Modules accepted: Level of Service

## 2023-02-19 NOTE — Unmapped (Signed)
[facility] Refill Coordination Note    Specialty Medication(s) to be Shipped:   Inflammatory Disorders: Dupixent    Other medication(s) to be shipped: No additional medications requested for fill at this time     Samantha Miles, DOB: 1986/10/01  Phone: [phone] (home)       All above HIPAA information was verified with patient.     Was a Nurse, learning disability used for this call? No    Completed refill call assessment today to schedule patient's medication shipment from the [facility] [phone]).  All relevant notes have been reviewed.     Specialty medication(s) and dose(s) confirmed: Regimen is correct and unchanged.   Changes to medications: Berdene reports no changes at this time.  Changes to insurance: No  New side effects reported not previously addressed with a pharmacist or physician: None reported  Questions for the pharmacist: No    Confirmed patient received a Conservation officer, historic buildings and a Surveyor, mining with first shipment. The patient will receive a drug information handout for each medication shipped and additional FDA Medication Guides as required.       DISEASE/MEDICATION-SPECIFIC INFORMATION        For patients on injectable medications: Patient currently has 1 doses left.  Next injection is scheduled for 3/31.    SPECIALTY MEDICATION ADHERENCE     Medication Adherence    Specialty Medication: Dupixent 300mg /19mL              Were doses missed due to medication being on hold? No    Dupixent 300/2 mg/ml: 9 days of medicine on hand       REFERRAL TO PHARMACIST     Referral to the pharmacist: Not needed      [facility]     Shipping address confirmed in Epic.     Patient was notified of new phone menu : Yes: .    Delivery Scheduled: Yes, Expected medication delivery date: 3/28.     Medication will be delivered via Same Day Courier to the prescription address in Epic WAM.    Samantha Miles   [facility] Specialty Technician

## 2023-03-26 MED FILL — DUPIXENT 300 MG/2 ML SUBCUTANEOUS PEN INJECTOR: SUBCUTANEOUS | 28 days supply | Qty: 4 | Fill #1

## 2023-04-05 NOTE — Unmapped (Signed)
Samantha Miles reports her Dupixent therapy continues to go well. She's had no flares and no need for topical medication.     [facility] Clinical Assessment & Refill Coordination Note    Samantha Miles, DOB: 02/16/1986  Phone: [phone] (home)     All above HIPAA information was verified with patient.     Was a Nurse, learning disability used for this call? No    Specialty Medication(s):   Inflammatory Disorders: Dupixent     Current Outpatient Medications   Medication Sig Dispense Refill    betamethasone dipropionate (DIPROLENE) 0.05 % ointment betamethasone dipropionate 0.05 % topical ointment      betamethasone dipropionate 0.05 % cream Apply twice a day to eczema flare until the skin feels smooth, then stop. Do not apply to the skin on the face, underarms or groin. 30 g 3    betamethasone, augmented, (DIPROLENE) 0.05 % cream Apply twice daily to areas of eczema until flat and smooth. Avoid face and skin folds 50 g 5    clobetasoL (TEMOVATE) 0.05 % ointment Apply to eczema on hands at night until resolved. 60 g 5    dupilumab 300 mg/2 mL PnIj Inject the contents of 1 pen (300 mg total)  under the skin every fourteen (14) days. 4 mL 11    ibuprofen (ADVIL,MOTRIN) 800 MG tablet TAKE 1 TABLET BY MOUTH EVERY 8 HOURS AS NEEDED FOR MODERATE PAIN  0    meloxicam (MOBIC) 15 MG tablet Mobic 15 mg tablet   Take 1 tablet(s) every day by oral route.      metroNIDAZOLE (METROCREAM) 0.75 % cream Apply topically two (2) times a day. Apply to face. 60 g 5    ondansetron (ZOFRAN) 4 MG tablet Take 1 tablet (4 mg total) by mouth.      TRUEPLUS PEN NEEDLE 31 gauge x 1/4 (6 mm) Ndle       VICTOZA 2-PAK 0.6 mg/0.1 mL (18 mg/3 mL) injection        No current facility-administered medications for this visit.        Changes to medications: Elisabeth reports no changes at this time.    Allergies   Allergen Reactions    Sulfacetamide Sodium Hives and Rash    Latex Rash     gloves    Sulfamethoxazole Rash       Changes to allergies: No    SPECIALTY MEDICATION ADHERENCE     Dupixent     Medication Adherence    Patient reported X missed doses in the last month: 0  Specialty Medication: Dupixent          Specialty medication(s) dose(s) confirmed: Regimen is correct and unchanged.     Are there any concerns with adherence? No    Adherence counseling provided? Not needed    CLINICAL MANAGEMENT AND INTERVENTION      Clinical Benefit Assessment:    Do you feel the medicine is effective or helping your condition? Yes    Clinical Benefit counseling provided? Not needed    Adverse Effects Assessment:    Are you experiencing any side effects? No    Are you experiencing difficulty administering your medicine? No    Quality of Life Assessment:    Quality of Life    Rheumatology  Oncology  Dermatology  1. What impact has your specialty medication had on the symptoms of your skin condition (i.e. itchiness, soreness, stinging)?: Tremendous  2. What impact has your specialty medication had on your  comfort level with your skin?: Tremendous  Cystic Fibrosis          How many days over the past month did your AD  keep you from your normal activities? For example, brushing your teeth or getting up in the morning. 0    Have you discussed this with your provider? Not needed    Acute Infection Status:    Acute infections noted within Epic:  No active infections  Patient reported infection: None    Therapy Appropriateness:    Is therapy appropriate and patient progressing towards therapeutic goals? Yes, therapy is appropriate and should be continued    DISEASE/MEDICATION-SPECIFIC INFORMATION      For patients on injectable medications: Patient currently has 0  doses left.  Next injection is scheduled for 5/12.    Chronic Inflammatory Diseases: Have you experienced any flares in the last month? No  Has this been reported to your provider? No    PATIENT SPECIFIC NEEDS     Does the patient have any physical, cognitive, or cultural barriers? No    Is the patient high risk? No    Did the patient require a clinical intervention? No    Does the patient require physician intervention or other additional services (i.e., nutrition, smoking cessation, social work)? No    SOCIAL DETERMINANTS OF HEALTH     At the [facility], we have learned that life circumstances - like trouble affording food, housing, utilities, or transportation can affect the health of many of our patients.   That is why we wanted to ask: are you currently experiencing any life circumstances that are negatively impacting your health and/or quality of life? Patient declined to answer    Social Determinants of Health     Financial Resource Strain: Not on file   Internet Connectivity: Not on file   Food Insecurity: Not on file   Tobacco Use: Low Risk  (02/01/2023)    Patient History     Smoking Tobacco Use: Never     Smokeless Tobacco Use: Never     Passive Exposure: Not on file   Housing/Utilities: Not on file   Alcohol Use: Not on file   Transportation Needs: Not on file   Substance Use: Not on file   Health Literacy: Not on file   Physical Activity: Not on file   Interpersonal Safety: Not on file   Stress: Not on file   Intimate Partner Violence: Not on file   Depression: Not on file   Social Connections: Not on file       Would you be willing to receive help with any of the needs that you have identified today? Not applicable       SHIPPING     Specialty Medication(s) to be Shipped:   Inflammatory Disorders: Dupixent    Other medication(s) to be shipped: No additional medications requested for fill at this time     Changes to insurance: No    Delivery Scheduled: Yes, Expected medication delivery date: 4/26.     Medication will be delivered via Same Day Courier to the confirmed prescription address in [facility].    The patient will receive a drug information handout for each medication shipped and additional FDA Medication Guides as required.  Verified that patient has previously received a Conservation officer, historic buildings and a Surveyor, mining.    The patient or caregiver noted above participated in the development of this care plan and knows that they can request review  of or adjustments to the care plan at any time.      All of the patient's questions and concerns have been addressed.    Lanney Gins   [facility] Specialty Pharmacist

## 2023-04-12 LAB — COMPREHENSIVE METABOLIC PANEL
ALBUMIN: 4 g/dL (ref 3.4–5.0)
ALKALINE PHOSPHATASE: 82 U/L (ref 46–116)
ALT (SGPT): 8 U/L — ABNORMAL LOW (ref 10–49)
ANION GAP: 7 mmol/L (ref 5–14)
AST (SGOT): 12 U/L (ref <=34)
BILIRUBIN TOTAL: 0.5 mg/dL (ref 0.3–1.2)
BLOOD UREA NITROGEN: 10 mg/dL (ref 9–23)
BUN / CREAT RATIO: 11
CALCIUM: 9.7 mg/dL (ref 8.7–10.4)
CHLORIDE: 108 mmol/L — ABNORMAL HIGH (ref 98–107)
CO2: 25.4 mmol/L (ref 20.0–31.0)
CREATININE: 0.95 mg/dL
EGFR CKD-EPI (2021) FEMALE: 80 mL/min/{1.73_m2} (ref >=60)
GLUCOSE RANDOM: 80 mg/dL (ref 70–179)
POTASSIUM: 3.9 mmol/L (ref 3.4–4.8)
PROTEIN TOTAL: 7 g/dL (ref 5.7–8.2)
SODIUM: 140 mmol/L (ref 135–145)

## 2023-04-12 LAB — CBC W/ AUTO DIFF
BASOPHILS ABSOLUTE COUNT: 0.1 10*9/L (ref 0.0–0.1)
BASOPHILS RELATIVE PERCENT: 0.9 %
EOSINOPHILS ABSOLUTE COUNT: 0.1 10*9/L (ref 0.0–0.5)
EOSINOPHILS RELATIVE PERCENT: 1.3 %
HEMATOCRIT: 42.5 % (ref 34.0–44.0)
HEMOGLOBIN: 14.6 g/dL (ref 11.3–14.9)
LYMPHOCYTES ABSOLUTE COUNT: 3.1 10*9/L (ref 1.1–3.6)
LYMPHOCYTES RELATIVE PERCENT: 31.6 %
MEAN CORPUSCULAR HEMOGLOBIN CONC: 34.4 g/dL (ref 32.0–36.0)
MEAN CORPUSCULAR HEMOGLOBIN: 29 pg (ref 25.9–32.4)
MEAN CORPUSCULAR VOLUME: 84.3 fL (ref 77.6–95.7)
MEAN PLATELET VOLUME: 7.6 fL (ref 6.8–10.7)
MONOCYTES ABSOLUTE COUNT: 0.6 10*9/L (ref 0.3–0.8)
MONOCYTES RELATIVE PERCENT: 5.7 %
NEUTROPHILS ABSOLUTE COUNT: 6 10*9/L (ref 1.8–7.8)
NEUTROPHILS RELATIVE PERCENT: 60.5 %
NUCLEATED RED BLOOD CELLS: 0 /100{WBCs} (ref <=4)
PLATELET COUNT: 388 10*9/L (ref 150–450)
RED BLOOD CELL COUNT: 5.04 10*12/L (ref 3.95–5.13)
RED CELL DISTRIBUTION WIDTH: 14.4 % (ref 12.2–15.2)
WBC ADJUSTED: 9.9 10*9/L (ref 3.6–11.2)

## 2023-04-12 LAB — URINALYSIS WITH MICROSCOPY WITH CULTURE REFLEX
BACTERIA: NONE SEEN /HPF
BILIRUBIN UA: NEGATIVE
BLOOD UA: NEGATIVE
GLUCOSE UA: NEGATIVE
LEUKOCYTE ESTERASE UA: NEGATIVE
NITRITE UA: NEGATIVE
PH UA: 5.5 (ref 5.0–9.0)
RBC UA: 1 /HPF (ref <=4)
SPECIFIC GRAVITY UA: 1.031 — ABNORMAL HIGH (ref 1.003–1.030)
SQUAMOUS EPITHELIAL: 1 /HPF (ref 0–5)
UROBILINOGEN UA: 2 — AB
WBC UA: 2 /HPF (ref 0–5)

## 2023-04-12 LAB — PREGNANCY, URINE: PREGNANCY TEST URINE: NEGATIVE

## 2023-04-13 ENCOUNTER — Ambulatory Visit: Admit: 2023-04-13 | Discharge: 2023-04-13 | Disposition: A | Discharge status: Home / Self Care | Payer: MEDICARE

## 2023-04-13 MED ORDER — NAPROXEN 500 MG TABLET
ORAL_TABLET | Freq: Two times a day (BID) | ORAL | 0 refills | 7 days | Status: CP
Start: 2023-04-13 — End: 2023-04-20

## 2023-04-13 MED ADMIN — lidocaine 4 % patch 1 patch: 1 | TRANSDERMAL | @ 05:00:00 | Stop: 2023-04-13

## 2023-04-13 MED ADMIN — acetaminophen (TYLENOL) tablet 1,000 mg: 1000 mg | ORAL | @ 05:00:00 | Stop: 2023-04-13

## 2023-04-13 MED ADMIN — ketorolac (TORADOL) injection 30 mg: 30 mg | INTRAMUSCULAR | @ 05:00:00 | Stop: 2023-04-13

## 2023-04-13 NOTE — Unmapped (Signed)
Pt ambulatory to triage for stabbing left flank pain onset today  Denies fevers  +odor to urine

## 2023-04-13 NOTE — Unmapped (Signed)
[facility] Emergency Department Provider Note      ED Course, Assessment, and Plan     37 y.o. female presented for left back pain since this morning. Vitals reassuring. On exam patient resting comfortably in no distress, abdomen soft and nontender, reproducible tenderness to palpation of left upper lumbar paraspinal muscles without overlying skin changes, no CVA tenderness. Presentation most consistent with muscle strain given location and lack of associated symptoms. Minimal concern for spinal cord compression syndrome or infectious process. Patient reports feeling significantly better after pain medications. Stable for discharge home.  _____________________________________________________________________    The case was discussed with the attending physician who is in agreement with the above assessment and plan.    Additional Medical Decision Making     - Any discussion of this patient's case/presentation between myself and consultants, admitting teams, or other team members has been documented above.  - Imaging and other studies, if performed, that were available during my care of the patient were independently reviewed and interpreted by me and considered in my medical decision making as documented above.    History     Chief Complaint:   Chief Complaint   Patient presents with    Flank Pain       History of Present Illness:  Samantha Miles is a 37 y.o. female with history as below presenting for left back pain since this morning. Has had similar pain in the past which resolves after tylenol or ibuprofen. Denies any falls, fevers or chills, chest pain, abdominal pain, numbness or tingling, weakness, or bladder/bowel incontinence.    Past Medical History:  Past Medical History:   Diagnosis Date    Depression     Eczema        Allergies:   Sulfacetamide sodium, Latex, and Sulfamethoxazole    Past Surgical History:   Past Surgical History:   Procedure Laterality Date    INNER EAR SURGERY      MIDDLE EAR SURGERY      TUBAL LIGATION         Social History:   Social History     Tobacco Use    Smoking status: Never    Smokeless tobacco: Never   Substance Use Topics    Alcohol use: No       Family History:  Family History   Problem Relation Age of Onset    Multiple sclerosis Mother     Melanoma Neg Hx     Basal cell carcinoma Neg Hx     Squamous cell carcinoma Neg Hx         Physical Exam     Vital Signs:    BP 136/83  - Pulse 85  - Temp 36.9 ??C (98.4 ??F)  - Resp 20  - LMP 09/01/2021 (Approximate)  - SpO2 98%     General: Awake, in no distress.  Skin: Warm, dry.  Heart: Regular rhythm, normal heart sounds.  Lungs: Normal work of breathing.  Abdomen: Soft, non-tender.  Musculoskeletal: Documented above.  Neurological: No gross focal neurologic deficits are appreciated.  Psychiatric: Normal affect and behavior for situation.  _____________________________________________________________________    Please note - This documentation was generated using dictation and/or voice recognition software, and as such, may contain spelling or other transcription errors. Any questions regarding the content of this documentation should be directed to the individual who electronically signed.     Anders Grant, MD  Resident  04/13/23 [phone]

## 2023-04-21 NOTE — Unmapped (Signed)
[facility] Refill Coordination Note    Specialty Medication(s) to be Shipped:   Inflammatory Disorders: Dupixent    Other medication(s) to be shipped: No additional medications requested for fill at this time     Samantha Miles, DOB: 11-10-1986  Phone: [phone] (home)       All above HIPAA information was verified with patient.     Was a Nurse, learning disability used for this call? No    Completed refill call assessment today to schedule patient's medication shipment from the [facility] [phone]).  All relevant notes have been reviewed.     Specialty medication(s) and dose(s) confirmed: Regimen is correct and unchanged.   Changes to medications: Samantha Miles reports no changes at this time.  Changes to insurance: No; Patient states she will have new insurance with [facility] next month but has not received cards. Patient will call us to update insurance information when she receives them.  New side effects reported not previously addressed with a pharmacist or physician: None reported  Questions for the pharmacist: No    Confirmed patient received a Conservation officer, historic buildings and a Surveyor, mining with first shipment. The patient will receive a drug information handout for each medication shipped and additional FDA Medication Guides as required.       DISEASE/MEDICATION-SPECIFIC INFORMATION        For patients on injectable medications: Patient currently has 1 doses left.  Next injection is scheduled for 5/26.    SPECIALTY MEDICATION ADHERENCE     Medication Adherence    Patient reported X missed doses in the last month: 0  Specialty Medication: dupilumab 300 mg/2 mL PnIj  Patient is on additional specialty medications: No  Informant: patient              Were doses missed due to medication being on hold? No    Dupixent 300/2 mg/ml: 5 days of medicine on hand       REFERRAL TO PHARMACIST     Referral to the pharmacist: Not needed      [facility]     Shipping address confirmed in Epic. Patient was notified of new phone menu : Yes: .    Delivery Scheduled: Yes, Expected medication delivery date: 5/24.     Medication will be delivered via Same Day Courier to the prescription address in Epic WAM.    [name]   [facility] Specialty Technician

## 2023-04-23 MED FILL — DUPIXENT 300 MG/2 ML SUBCUTANEOUS PEN INJECTOR: SUBCUTANEOUS | 28 days supply | Qty: 4 | Fill #2

## 2023-05-12 DIAGNOSIS — L209 Atopic dermatitis, unspecified: Principal | ICD-10-CM

## 2023-05-12 NOTE — Unmapped (Signed)
[facility] Refill Coordination Note    Specialty Medication(s) to be Shipped:   Inflammatory Disorders: Dupixent    Other medication(s) to be shipped: No additional medications requested for fill at this time     Samantha Miles, DOB: 08/15/1986  Phone: [phone] (home)       All above HIPAA information was verified with patient.     Was a Nurse, learning disability used for this call? No    Completed refill call assessment today to schedule patient's medication shipment from the [facility] [phone]).  All relevant notes have been reviewed.     Specialty medication(s) and dose(s) confirmed: Regimen is correct and unchanged.   Changes to medications: Samantha Miles reports no changes at this time.  Changes to insurance: Yes: updated in [place]  New side effects reported not previously addressed with a pharmacist or physician: None reported  Questions for the pharmacist: No    Confirmed patient received a Conservation officer, historic buildings and a Surveyor, mining with first shipment. The patient will receive a drug information handout for each medication shipped and additional FDA Medication Guides as required.       DISEASE/MEDICATION-SPECIFIC INFORMATION        For patients on injectable medications: Patient currently has 0 doses left.  Next injection is scheduled for 6/16.    SPECIALTY MEDICATION ADHERENCE     Medication Adherence    Patient reported X missed doses in the last month: 0  Specialty Medication: dupilumab (Dupixent) 300 mg/2 mL PnIj  Patient is on additional specialty medications: No  Informant: patient              Were doses missed due to medication being on hold? No    Dupixent 300/2 mg/ml: 0 days of medicine on hand       REFERRAL TO PHARMACIST     Referral to the pharmacist: Not needed      [facility]     Shipping address confirmed in Epic.     Patient was notified of new phone menu : Yes: .    Delivery Scheduled: Yes, Expected medication delivery date: 6/14.     Medication will be delivered via Same Day Courier to the prescription address in Epic WAM.    Samantha Miles   [facility] Specialty Technician

## 2023-05-14 MED FILL — DUPIXENT 300 MG/2 ML SUBCUTANEOUS PEN INJECTOR: SUBCUTANEOUS | 28 days supply | Qty: 4 | Fill #3

## 2023-06-07 NOTE — Unmapped (Signed)
[facility] Refill Coordination Note    Specialty Medication(s) to be Shipped:   Inflammatory Disorders: Dupixent    Other medication(s) to be shipped: No additional medications requested for fill at this time     Samantha Miles, DOB: September 26, 1986  Phone: [phone] (home)       All above HIPAA information was verified with patient.     Was a Nurse, learning disability used for this call? No    Completed refill call assessment today to schedule patient's medication shipment from the [facility] [phone]).  All relevant notes have been reviewed.     Specialty medication(s) and dose(s) confirmed: Regimen is correct and unchanged.   Changes to medications: Dayzha reports no changes at this time.  Changes to insurance: No  New side effects reported not previously addressed with a pharmacist or physician: None reported  Questions for the pharmacist: No    Confirmed patient received a Conservation officer, historic buildings and a Surveyor, mining with first shipment. The patient will receive a drug information handout for each medication shipped and additional FDA Medication Guides as required.       DISEASE/MEDICATION-SPECIFIC INFORMATION        For patients on injectable medications: Patient currently has 0 doses left.  Next injection is scheduled for 7/14.    SPECIALTY MEDICATION ADHERENCE     Medication Adherence    Patient reported X missed doses in the last month: 0  Specialty Medication: dupilumab 300 mg/2 mL PnIj  Patient is on additional specialty medications: No  Informant: patient              Were doses missed due to medication being on hold? No    Dupixent 300/2 mg/ml: 0 days of medicine on hand       REFERRAL TO PHARMACIST     Referral to the pharmacist: Not needed      [facility]     Shipping address confirmed in Epic.     Patient was notified of new phone menu : Yes: .    Delivery Scheduled: Yes, Expected medication delivery date: 7/12.     Medication will be delivered via Same Day Courier to the prescription address in Epic WAM.    Samantha Miles   [facility] Specialty Technician

## 2023-06-11 MED FILL — DUPIXENT 300 MG/2 ML SUBCUTANEOUS PEN INJECTOR: SUBCUTANEOUS | 28 days supply | Qty: 4 | Fill #4

## 2023-06-28 ENCOUNTER — Emergency Department: Payer: 59

## 2023-06-28 ENCOUNTER — Encounter: Payer: Self-pay | Admitting: Emergency Medicine

## 2023-06-28 ENCOUNTER — Other Ambulatory Visit: Payer: Self-pay

## 2023-06-28 ENCOUNTER — Emergency Department
Admission: EM | Admit: 2023-06-28 | Discharge: 2023-06-29 | Disposition: A | Discharge status: Home / Self Care | Payer: 59 | Attending: Emergency Medicine | Admitting: Emergency Medicine

## 2023-06-28 DIAGNOSIS — R109 Unspecified abdominal pain: Secondary | ICD-10-CM | POA: Insufficient documentation

## 2023-06-28 DIAGNOSIS — I10 Essential (primary) hypertension: Secondary | ICD-10-CM | POA: Diagnosis not present

## 2023-06-28 LAB — URINALYSIS, ROUTINE W REFLEX MICROSCOPIC
Bilirubin Urine: NEGATIVE
Glucose, UA: NEGATIVE mg/dL
Hgb urine dipstick: NEGATIVE
Ketones, ur: NEGATIVE mg/dL
Leukocytes,Ua: NEGATIVE
Nitrite: NEGATIVE
Protein, ur: NEGATIVE mg/dL
Specific Gravity, Urine: 1.018 (ref 1.005–1.030)
pH: 5 (ref 5.0–8.0)

## 2023-06-28 LAB — CBC WITH DIFFERENTIAL/PLATELET
Abs Immature Granulocytes: 0.02 10*3/uL (ref 0.00–0.07)
Basophils Absolute: 0.1 10*3/uL (ref 0.0–0.1)
Basophils Relative: 1 %
Eosinophils Absolute: 0.1 10*3/uL (ref 0.0–0.5)
Eosinophils Relative: 1 %
HCT: 42.7 % (ref 36.0–46.0)
Hemoglobin: 14.1 g/dL (ref 12.0–15.0)
Immature Granulocytes: 0 %
Lymphocytes Relative: 33 %
Lymphs Abs: 3.2 10*3/uL (ref 0.7–4.0)
MCH: 28.8 pg (ref 26.0–34.0)
MCHC: 33 g/dL (ref 30.0–36.0)
MCV: 87.1 fL (ref 80.0–100.0)
Monocytes Absolute: 0.5 10*3/uL (ref 0.1–1.0)
Monocytes Relative: 5 %
Neutro Abs: 5.8 10*3/uL (ref 1.7–7.7)
Neutrophils Relative %: 60 %
Platelets: 360 10*3/uL (ref 150–400)
RBC: 4.9 MIL/uL (ref 3.87–5.11)
RDW: 13.4 % (ref 11.5–15.5)
WBC: 9.7 10*3/uL (ref 4.0–10.5)
nRBC: 0 % (ref 0.0–0.2)

## 2023-06-28 LAB — COMPREHENSIVE METABOLIC PANEL
ALT: 11 U/L (ref 0–44)
AST: 18 U/L (ref 15–41)
Albumin: 4.1 g/dL (ref 3.5–5.0)
Alkaline Phosphatase: 61 U/L (ref 38–126)
Anion gap: 8 (ref 5–15)
BUN: 12 mg/dL (ref 6–20)
CO2: 25 mmol/L (ref 22–32)
Calcium: 9.1 mg/dL (ref 8.9–10.3)
Chloride: 103 mmol/L (ref 98–111)
Creatinine, Ser: 0.85 mg/dL (ref 0.44–1.00)
GFR, Estimated: >60 mL/min (ref >60)
Glucose, Bld: 107 mg/dL — ABNORMAL HIGH (ref 70–99)
Potassium: 4.1 mmol/L (ref 3.5–5.1)
Sodium: 136 mmol/L (ref 135–145)
Total Bilirubin: 0.7 mg/dL (ref 0.3–1.2)
Total Protein: 7 g/dL (ref 6.5–8.1)

## 2023-06-28 LAB — POC URINE PREG, ED: Preg Test, Ur: NEGATIVE

## 2023-06-28 MED ORDER — KETOROLAC TROMETHAMINE 60 MG/2ML IM SOLN
60.0000 mg | Freq: Once | INTRAMUSCULAR | Status: AC
  Administered 2023-06-28: 60 mg via INTRAMUSCULAR
  Filled 2023-06-28: qty 2

## 2023-06-28 NOTE — ED Provider Notes (Signed)
[facility] Provider Note    Event Date/Time   First MD Initiated Contact with Patient 06/28/23 2236     (approximate)  History   Chief Complaint: Flank Pain  HPI  Brittney Sanders is a 37 y.o. female with a past medical history of depression, gastric reflux, hypertension, presents to the emergency department for left flank pain.  According to the patient she has a history of kidney stones.  States she has had left flank pain for the past 4 to 5 hours describes as sharp and moderate in severity.  Denies any nausea vomiting diarrhea dysuria or hematuria.  Patient states that history of kidney stones as well as urinary infections in the past.  No known fever.  Physical Exam   Triage Vital Signs: ED Triage Vitals  Encounter Vitals Group     BP 06/28/23 2113 132/86     Systolic BP Percentile --      Diastolic BP Percentile --      Pulse Rate 06/28/23 2113 85     Resp 06/28/23 2113 18     Temp 06/28/23 2113 98.2 F (36.8 C)     Temp Source 06/28/23 2113 Oral     SpO2 06/28/23 2113 100 %     Weight 06/28/23 2111 230 lb (104.3 kg)     Height 06/28/23 2111 5\' 3"  (1.6 m)     Head Circumference --      Peak Flow --      Pain Score 06/28/23 2113 4     Pain Loc --      Pain Education --      Exclude from Growth Chart --     Most recent vital signs: Vitals:   06/28/23 2113  BP: 132/86  Pulse: 85  Resp: 18  Temp: 98.2 F (36.8 C)  SpO2: 100%    General: Awake, no distress.  CV:  Good peripheral perfusion.  Regular rate and rhythm  Resp:  Normal effort.  Equal breath sounds bilaterally.  Abd:  No distention.  Soft, mild left mid abdominal tenderness to palpation without rebound or guarding.  ED Results / Procedures / Treatments   RADIOLOGY  CT pending   MEDICATIONS ORDERED IN ED: Medications - No data to display   IMPRESSION / MDM / ASSESSMENT AND PLAN / ED COURSE  I reviewed the triage vital signs and the nursing notes.  Patient's  presentation is most consistent with acute presentation with potential threat to life or bodily function.  Patient presents emergency department for left-sided abdominal pain described more as sharp lasting for the last 4 to 5 hours.  States the pain is a 5/10 currently.  Patient denies any fever.  No known urinary symptoms although states her UTIs are often asymptomatic.  Patient's lab work is reassuring with a normal CBC, normal chemistry including normal renal function normal LFTs urinalysis shows no blood no sign of obvious infection and pregnancy test is negative.  However given the patient's description of moderate left-sided sharp pains we will obtain a CT renal scan to evaluate for ureterolithiasis possible colitis diverticulitis or pyelonephritis.  Will dose IM Toradol awaiting CT results.  Patient agreeable to plan of care.  CT scan is pending.  Patient care signed out to oncoming provider.  FINAL CLINICAL IMPRESSION(S) / ED DIAGNOSES   Left flank pain  Note:  This document was prepared using Dragon voice recognition software and may include unintentional dictation errors.   Minna Antis, MD 06/28/23 2314

## 2023-06-28 NOTE — ED Triage Notes (Signed)
Pt presents ambulatory to triage via POV with complaints of L sided flank pain x 3-4 hours. Pt states that she has hx of kidney stones and frequent UTIs. She notes not being able to tell when she has a UTI because she doesn't have the typical sx. A&Ox4 at this time. Denies fevers, chills, CP or SOB.

## 2023-06-29 NOTE — Discharge Instructions (Addendum)
You were seen in the Emergency Department today for evaluation of your abdominal pain. Fortunately, your labs, urine test, and CT scan were overall reassuring against an emergency cause for your pain. Please follow-up with your primary care doctor within the next few days for reevaluation. Return to the ER for any new or worsening symptoms including worsening pain, inability to tolerate food or liquids, or any other new or concerning symptoms 

## 2023-06-29 NOTE — ED Provider Notes (Signed)
Care of this patient assumed from prior physician at 2300 pending CT, reevaluation, and disposition. Please see prior physician note for further details.  Briefly this is a 36 year old female who presented with left lower abdominal/flank pain for the past several hours.  No associated vomiting or diarrhea. Labs reassuring.  Urine without evidence of infection.  UPT negative.  Signed out to me pending CT. this was without evidence of hydronephrosis or ureteral stone.  Nonobstructing intrarenal kidney stones noted.  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  Patient reevaluated and updated on the results of her workup.  She is comfortable discharge home.  Strict return precautions provided.  Patient discharged stable condition.   Trinna Post, MD 06/29/23 [phone]

## 2023-06-29 NOTE — ED Notes (Signed)
Provided pt with discharge instructions and education. All of pt questions answered. Pt AAOX4 and stable at time of discharge.Pt ambulated w/ steady gait towards ED exit. Pt accompanied by family member.

## 2023-07-02 ENCOUNTER — Other Ambulatory Visit: Payer: Self-pay

## 2023-07-02 ENCOUNTER — Emergency Department
Admission: EM | Admit: 2023-07-02 | Discharge: 2023-07-02 | Disposition: A | Discharge status: Home / Self Care | Payer: 59 | Attending: Emergency Medicine | Admitting: Emergency Medicine

## 2023-07-02 DIAGNOSIS — R109 Unspecified abdominal pain: Secondary | ICD-10-CM | POA: Diagnosis present

## 2023-07-02 DIAGNOSIS — R1012 Left upper quadrant pain: Secondary | ICD-10-CM | POA: Diagnosis not present

## 2023-07-02 LAB — COMPREHENSIVE METABOLIC PANEL
ALT: 11 U/L (ref 0–44)
AST: 14 U/L — ABNORMAL LOW (ref 15–41)
Albumin: 3.5 g/dL (ref 3.5–5.0)
Alkaline Phosphatase: 50 U/L (ref 38–126)
Anion gap: 8 (ref 5–15)
BUN: 12 mg/dL (ref 6–20)
CO2: 24 mmol/L (ref 22–32)
Calcium: 8.5 mg/dL — ABNORMAL LOW (ref 8.9–10.3)
Chloride: 106 mmol/L (ref 98–111)
Creatinine, Ser: 0.77 mg/dL (ref 0.44–1.00)
GFR, Estimated: >60 mL/min (ref >60)
Glucose, Bld: 96 mg/dL (ref 70–99)
Potassium: 3.4 mmol/L — ABNORMAL LOW (ref 3.5–5.1)
Sodium: 138 mmol/L (ref 135–145)
Total Bilirubin: 0.5 mg/dL (ref 0.3–1.2)
Total Protein: 6.3 g/dL — ABNORMAL LOW (ref 6.5–8.1)

## 2023-07-02 LAB — URINALYSIS, W/ REFLEX TO CULTURE (INFECTION SUSPECTED)
Bacteria, UA: NONE SEEN
Bilirubin Urine: NEGATIVE
Glucose, UA: NEGATIVE mg/dL
Hgb urine dipstick: NEGATIVE
Ketones, ur: NEGATIVE mg/dL
Leukocytes,Ua: NEGATIVE
Nitrite: NEGATIVE
Protein, ur: NEGATIVE mg/dL
Specific Gravity, Urine: 1.017 (ref 1.005–1.030)
pH: 6 (ref 5.0–8.0)

## 2023-07-02 LAB — CBC WITH DIFFERENTIAL/PLATELET
Abs Immature Granulocytes: 0.02 10*3/uL (ref 0.00–0.07)
Basophils Absolute: 0.1 10*3/uL (ref 0.0–0.1)
Basophils Relative: 1 %
Eosinophils Absolute: 0.2 10*3/uL (ref 0.0–0.5)
Eosinophils Relative: 2 %
HCT: 39.5 % (ref 36.0–46.0)
Hemoglobin: 13.7 g/dL (ref 12.0–15.0)
Immature Granulocytes: 0 %
Lymphocytes Relative: 35 %
Lymphs Abs: 3.2 10*3/uL (ref 0.7–4.0)
MCH: 29.4 pg (ref 26.0–34.0)
MCHC: 34.7 g/dL (ref 30.0–36.0)
MCV: 84.8 fL (ref 80.0–100.0)
Monocytes Absolute: 0.8 10*3/uL (ref 0.1–1.0)
Monocytes Relative: 9 %
Neutro Abs: 4.8 10*3/uL (ref 1.7–7.7)
Neutrophils Relative %: 53 %
Platelets: 300 10*3/uL (ref 150–400)
RBC: 4.66 MIL/uL (ref 3.87–5.11)
RDW: 13.4 % (ref 11.5–15.5)
WBC: 9.1 10*3/uL (ref 4.0–10.5)
nRBC: 0 % (ref 0.0–0.2)

## 2023-07-02 LAB — LIPASE, BLOOD: Lipase: 40 U/L (ref 11–51)

## 2023-07-02 MED ORDER — SUCRALFATE 1 G PO TABS
1.0000 g | ORAL_TABLET | Freq: Once | ORAL | Status: AC
  Administered 2023-07-02: 1 g via ORAL
  Filled 2023-07-02: qty 1

## 2023-07-02 MED ORDER — OXYCODONE-ACETAMINOPHEN 5-325 MG PO TABS
1.0000 | ORAL_TABLET | Freq: Once | ORAL | Status: AC
  Administered 2023-07-02: 1 via ORAL
  Filled 2023-07-02: qty 1

## 2023-07-02 MED ORDER — METOCLOPRAMIDE HCL 10 MG PO TABS
10.0000 mg | ORAL_TABLET | Freq: Once | ORAL | Status: AC
  Administered 2023-07-02: 10 mg via ORAL
  Filled 2023-07-02: qty 1

## 2023-07-02 NOTE — ED Triage Notes (Signed)
Pt reports L flank pain. Ongoing and recent dx of renal stones in L kidney. Reports has not yet seen urologist. Pt ambulatory to triage. Alert and oriented following commands. Breathing unlabored speaking in full sentences with symmetric chest rise and fall. Denies difficulty urinating

## 2023-07-02 NOTE — ED Provider Notes (Signed)
[facility] Provider Note    Event Date/Time   First MD Initiated Contact with Patient 07/02/23 [phone]     (approximate)   History   Chief Complaint: Flank Pain   HPI  Brittney Sanders is a 37 y.o. female with a past history of kidney stones, depression, obesity who comes ED complaining of left-sided abdominal pain.  Pain has been ongoing for the past 4 to 5 days, constant, no aggravating or alleviating factors, nonradiating.  No dysuria.  No fever.     Physical Exam   Triage Vital Signs: ED Triage Vitals  Encounter Vitals Group     BP 07/02/23 0434 131/88     Systolic BP Percentile --      Diastolic BP Percentile --      Pulse Rate 07/02/23 0434 84     Resp 07/02/23 0434 18     Temp 07/02/23 0434 98.3 F (36.8 C)     Temp Source 07/02/23 0434 Oral     SpO2 07/02/23 0434 100 %     Weight 07/02/23 0433 229 lb 15 oz (104.3 kg)     Height 07/02/23 0433 5\' 3"  (1.6 m)     Head Circumference --      Peak Flow --      Pain Score 07/02/23 0432 9     Pain Loc --      Pain Education --      Exclude from Growth Chart --     Most recent vital signs: Vitals:   07/02/23 0530 07/02/23 0630  BP: (!) 128/101 120/83  Pulse: 64 86  Resp:    Temp:    SpO2: 100% 100%    General: Awake, no distress.  CV:  Good peripheral perfusion.  Resp:  Normal effort.  Abd:  No distention.  Soft with left upper quadrant tenderness Other:  Moist oral mucosa   ED Results / Procedures / Treatments   Labs (all labs ordered are listed, but only abnormal results are displayed) Labs Reviewed  COMPREHENSIVE METABOLIC PANEL - Abnormal; Notable for the following components:      Result Value   Potassium 3.4 (*)    Calcium 8.5 (*)    Total Protein 6.3 (*)    AST 14 (*)    All other components within normal limits  URINALYSIS, W/ REFLEX TO CULTURE (INFECTION SUSPECTED) - Abnormal; Notable for the following components:   Color, Urine YELLOW (*)    APPearance CLEAR (*)     All other components within normal limits  LIPASE, BLOOD  CBC WITH DIFFERENTIAL/PLATELET     EKG    RADIOLOGY    PROCEDURES:  Procedures   MEDICATIONS ORDERED IN ED: Medications  sucralfate (CARAFATE) tablet 1 g (1 g Oral Given 07/02/23 0526)  metoCLOPramide (REGLAN) tablet 10 mg (10 mg Oral Given 07/02/23 0526)  oxyCODONE-acetaminophen (PERCOCET/ROXICET) 5-325 MG per tablet 1 tablet (1 tablet Oral Given 07/02/23 0526)     IMPRESSION / MDM / ASSESSMENT AND PLAN / ED COURSE  I reviewed the triage vital signs and the nursing notes.  DDx: Gastritis, cystitis, AKI, electrolyte abnormality  Patient's presentation is most consistent with acute presentation with potential threat to life or bodily function.  Patient presents with left-sided abdominal pain.  Reviewing prior records notes that she was seen here just 3 days ago on June 28, 2023, had normal labs.  CT abdomen pelvis showed nephrolithiasis with 4 mm stone in the lower pole of the left kidney  and numerous punctate stones, no ureterolithiasis.  Suspect symptoms today are related to ureteral colic.  With increased pain and tenderness, will obtain repeat labs with urinalysis.   ----------------------------------------- 6:53 AM on 07/02/2023 ----------------------------------------- Labs are all normal.  Will plan for continued outpatient follow-up.      FINAL CLINICAL IMPRESSION(S) / ED DIAGNOSES   Final diagnoses:  Left upper quadrant abdominal pain     Rx / DC Orders   ED Discharge Orders     None        Note:  This document was prepared using Dragon voice recognition software and may include unintentional dictation errors.   Sharman Cheek, MD 07/02/23 [phone]

## 2023-07-05 NOTE — Unmapped (Signed)
[facility] Refill Coordination Note    Specialty Medication(s) to be Shipped:   Inflammatory Disorders: Dupixent    Other medication(s) to be shipped: No additional medications requested for fill at this time     Samantha Miles, DOB: 1986/01/30  Phone: [phone] (home)       All above HIPAA information was verified with patient.     Was a Nurse, learning disability used for this call? No    Completed refill call assessment today to schedule patient's medication shipment from the [facility] [phone]).  All relevant notes have been reviewed.     Specialty medication(s) and dose(s) confirmed: Regimen is correct and unchanged.   Changes to medications: Samantha Miles reports no changes at this time.  Changes to insurance: No  New side effects reported not previously addressed with a pharmacist or physician: None reported  Questions for the pharmacist: No    Confirmed patient received a Conservation officer, historic buildings and a Surveyor, mining with first shipment. The patient will receive a drug information handout for each medication shipped and additional FDA Medication Guides as required.       DISEASE/MEDICATION-SPECIFIC INFORMATION        For patients on injectable medications: Patient currently has 0 doses left.  Next injection is scheduled for 8/11.    SPECIALTY MEDICATION ADHERENCE     Medication Adherence    Patient reported X missed doses in the last month: 0  Specialty Medication: dupilumab (Dupixent)300 mg/2 mL PnIj  Patient is on additional specialty medications: No  Informant: patient              Were doses missed due to medication being on hold? No    Dupixent 300/2 mg/ml: 0 days of medicine on hand       REFERRAL TO PHARMACIST     Referral to the pharmacist: Not needed      [facility]     Shipping address confirmed in Epic.     Patient was notified of new phone menu : Yes: .    Delivery Scheduled: Yes, Expected medication delivery date: 8/09.     Medication will be delivered via Same Day Courier to the prescription address in Epic WAM.    Samantha Miles   [facility] Specialty Technician

## 2023-07-07 ENCOUNTER — Encounter: Payer: Self-pay | Admitting: Urology

## 2023-07-07 ENCOUNTER — Ambulatory Visit (INDEPENDENT_AMBULATORY_CARE_PROVIDER_SITE_OTHER): Payer: 59 | Admitting: Urology

## 2023-07-07 VITALS — BP 126/85 | HR 82 | Ht 63.0 in | Wt 234.0 lb

## 2023-07-07 DIAGNOSIS — N200 Calculus of kidney: Secondary | ICD-10-CM | POA: Diagnosis not present

## 2023-07-07 LAB — URINALYSIS, COMPLETE
Bilirubin, UA: NEGATIVE
Glucose, UA: NEGATIVE
Ketones, UA: NEGATIVE
Leukocytes,UA: NEGATIVE
Nitrite, UA: NEGATIVE
Protein,UA: NEGATIVE
RBC, UA: NEGATIVE
Specific Gravity, UA: 1.02 (ref 1.005–1.030)
Urobilinogen, Ur: 0.2 mg/dL (ref 0.2–1.0)
pH, UA: 6.5 (ref 5.0–7.5)

## 2023-07-07 LAB — MICROSCOPIC EXAMINATION

## 2023-07-07 NOTE — Progress Notes (Signed)
I, Brittney Sanders, acting as a scribe for Brittney Altes, MD., have documented all relevant documentation on the behalf of Brittney Altes, MD, as directed by Brittney Altes, MD while in the presence of Brittney Altes, MD.  07/07/2023 2:25 PM   Brittney Sanders 10-24-1986 [id number]  Referring provider: [facility] [address] [place],  [place] [address]  Chief Complaint  Patient presents with   Nephrolithiasis    HPI: Brittney Sanders is a 37 y.o. female presents in follow-up of recent ED visit for renal colic.  History of recurrent UTI and pyelonephritis and has been seen intermittently in our office since 2017 with her last visit November 2020. I last saw in 2019 for microhematuria with CT showing a non-obstructing left renal calculus and cystoscopy unremarkable.  She has a history of vesicoureteral reflux. Her last visit in November 2020 was with Brittney Sanders and left renal calculus was stable She had ER visits x2 06/28/23 and 07/02/2023 for left flank pain which was described as moderate in severity. She had CTs at both visits, which showed non-obstructing left renal calculus and non obstructing 5mm left lower pole calculus and punctate left renal calculi. There was no hydronephrosis.  Urology follow was recommended.  Since her last visit, she states her pain is much better. She is not aware of passing a stone.   PMH: Past Medical History:  Diagnosis Date   Depression    Eczema    GERD (gastroesophageal reflux disease)    Hearing deficit    Heel spur    History of kidney stones    History of methicillin resistant staphylococcus aureus (MRSA) 2021   Hypertension    Nocturia    Obesity    Overweight    Plantar fasciitis    Seasonal allergies    UTI (lower urinary tract infection)     Surgical History: Past Surgical History:  Procedure Laterality Date   ESOPHAGOGASTRODUODENOSCOPY (EGD) WITH PROPOFOL N/A 02/12/2022    Procedure: ESOPHAGOGASTRODUODENOSCOPY (EGD) WITH PROPOFOL;  Surgeon: Brittney Collins, DO;  Location: [facility] END[facility];  Service: Gastroenterology;  Laterality: N/A;   TUBAL LIGATION     TYMPANOSTOMY TUBE PLACEMENT Right    VAGINAL HYSTERECTOMY N/A 05/21/2022   Procedure: HYSTERECTOMY VAGINAL;  Surgeon: Brittney Gottron Ihor Austin, MD;  Location: [facility] ORS;  Service: Gynecology;  Laterality: N/A;    Home Medications:  Allergies as of 07/07/2023       Reactions   Sulfa Antibiotics Hives, Rash   Latex Rash   gloves   Sulfamethoxazole Rash        Medication List        Accurate as of July 07, 2023  2:25 PM. If you have any questions, ask your nurse or doctor.          STOP taking these medications    famotidine 20 MG tablet Commonly known as: Pepcid Stopped by: Brittney Sanders   ibuprofen 800 MG tablet Commonly known as: ADVIL Stopped by: Brittney Sanders   ketorolac 10 MG tablet Commonly known as: TORADOL Stopped by: Brittney Sanders   liraglutide 18 MG/3ML Sopn Commonly known as: VICTOZA Stopped by: Brittney Sanders   lisinopril 10 MG tablet Commonly known as: ZESTRIL Stopped by: Brittney Sanders   magic mouthwash (lidocaine, diphenhydrAMINE, alum & mag hydroxide) suspension Stopped by: Brittney Sanders   metoCLOPramide 10 MG tablet Commonly known as: REGLAN Stopped by: Brittney Sanders  naproxen 500 MG tablet Commonly known as: Naprosyn Stopped by: Brittney Sanders   nortriptyline 10 MG capsule Commonly known as: PAMELOR Stopped by: Brittney Sanders   nystatin powder Commonly known as: MYCOSTATIN/NYSTOP Stopped by: Brittney Sanders    ondansetron 4 MG disintegrating tablet Commonly known as: ZOFRAN-ODT Stopped by: Brittney Sanders   pantoprazole 40 MG tablet Commonly known as: Protonix Stopped by: Brittney Sanders       TAKE these medications    clotrimazole-betamethasone cream Commonly known as: Lotrisone Apply 1 application. topically 2 (two)  times daily. What changed:  when to take this reasons to take this   Dupixent 300 MG/2ML prefilled syringe Generic drug: dupilumab 300 mg every 14 (fourteen) days.   fexofenadine 180 MG tablet Commonly known as: ALLEGRA Take 180 mg by mouth every morning.   phentermine 37.5 MG capsule Take 37.5 mg by mouth every morning.        Allergies:  Allergies  Allergen Reactions   Sulfa Antibiotics Hives and Rash   Latex Rash    gloves   Sulfamethoxazole Rash    Family History: Family History  Problem Relation Age of Onset   Urolithiasis Maternal Grandmother    Kidney disease Neg Hx    Bladder Cancer Neg Hx    Kidney cancer Neg Hx     Social History:  reports that she has never smoked. She has never used smokeless tobacco. She reports that she does not drink alcohol and does not use drugs.   Physical Exam: BP 126/85   Pulse 82   Ht 5\' 3"  (1.6 m)   Wt 234 lb (106.1 kg)   LMP 01/17/2022   BMI 41.45 kg/m   Constitutional:  Alert and oriented, No acute distress. HEENT: [place] AT, moist mucus membranes.  Trachea midline, no masses. Cardiovascular: No clubbing, cyanosis, or edema. Respiratory: Normal respiratory effort, no increased work of breathing. GI: Abdomen is soft, nontender, nondistended, no abdominal masses Skin: No rashes, bruises or suspicious lesions. Neurologic: Grossly intact, no focal deficits, moving all 4 extremities. Psychiatric: Normal mood and affect.   Urinalysis Dipstick/microscopy negative.    Pertinent Imaging: CT scan 06/28/23 was personally reviewed and interpreted. The punctate renal calculi are most likely intraparenchymal. There is a non-obstructing left lower pole renal calculus. There is scarring noted left renal cortex.   CT Renal Stone Study  Narrative CLINICAL DATA:  Abdomen flank pain  EXAM: CT ABDOMEN AND PELVIS WITHOUT CONTRAST  TECHNIQUE: Multidetector CT imaging of the abdomen and pelvis was performed following the standard  protocol without IV contrast.  RADIATION DOSE REDUCTION: This exam was performed according to the departmental dose-optimization program which includes automated exposure control, adjustment of the mA and/or kV according to patient size and/or use of iterative reconstruction technique.  COMPARISON:  CT 12/07/2022, 09/27/2022  FINDINGS: Lower chest: Lung bases are clear.  Hepatobiliary: No focal liver abnormality is seen. No gallstones, gallbladder wall thickening, or biliary dilatation.  Pancreas: Unremarkable. No pancreatic ductal dilatation or surrounding inflammatory changes.  Spleen: Normal in size without focal abnormality.  Adrenals/Urinary Tract: Adrenal glands are normal. Kidneys show no hydronephrosis. Multifocal cortical scarring left kidney. 4 mm stone lower pole left kidney. Multiple additional punctate stones in the left kidney. The bladder is unremarkable  Stomach/Bowel: Stomach is within normal limits. Appendix appears normal. No evidence of bowel wall thickening, distention, or inflammatory changes. 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  Vascular/Lymphatic: No significant vascular findings are present. No enlarged  abdominal or pelvic lymph nodes.  Reproductive: Status post hysterectomy. No adnexal masses.  Other: Negative for pelvic effusion or free air  Musculoskeletal: No acute or significant osseous findings.  IMPRESSION: 1. Negative for hydronephrosis or ureteral stone. 2. Left kidney stones. 3. 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   Electronically Signed By: Jasmine Pang M.D. On: 06/28/2023 23:35   Assessment & Plan:    1. Left nephrolithiasis We discussed that non-obstructing renal calculi are generally not considered to be a source of flank pain.  Ureteroscopy and shockwave lithotripsy could be attempted, however based on their lower pole location may not be successful. Would  recommend observation.  She's in agreement and will schedule a follow-up visit in 6 months with a KUB prior.  [facility] [address] [place], [place] [address] [phone]

## 2023-07-08 ENCOUNTER — Encounter: Payer: Self-pay | Admitting: Urology

## 2023-07-09 MED FILL — DUPIXENT 300 MG/2 ML SUBCUTANEOUS PEN INJECTOR: SUBCUTANEOUS | 28 days supply | Qty: 4 | Fill #5

## 2023-08-19 ENCOUNTER — Emergency Department: Payer: 59

## 2023-08-19 ENCOUNTER — Emergency Department
Admission: EM | Admit: 2023-08-19 | Discharge: 2023-08-19 | Disposition: A | Discharge status: Home / Self Care | Payer: 59 | Attending: Emergency Medicine | Admitting: Emergency Medicine

## 2023-08-19 ENCOUNTER — Other Ambulatory Visit: Payer: Self-pay

## 2023-08-19 DIAGNOSIS — W010XXA Fall on same level from slipping, tripping and stumbling without subsequent striking against object, initial encounter: Secondary | ICD-10-CM | POA: Diagnosis not present

## 2023-08-19 DIAGNOSIS — M79604 Pain in right leg: Secondary | ICD-10-CM | POA: Insufficient documentation

## 2023-08-19 DIAGNOSIS — Y9301 Activity, walking, marching and hiking: Secondary | ICD-10-CM | POA: Insufficient documentation

## 2023-08-19 MED ORDER — OXYCODONE-ACETAMINOPHEN 5-325 MG PO TABS
1.0000 | ORAL_TABLET | Freq: Once | ORAL | Status: AC
  Administered 2023-08-19: 1 via ORAL
  Filled 2023-08-19: qty 1

## 2023-08-19 NOTE — Discharge Instructions (Signed)
You were seen in the emergency department today for your right leg injury.  No evidence of any broken bones.  I am concerned about possible tear or strain of your hamstring.  You are able to walk on this as tolerated.  Use the crutches if pain is severe.  Otherwise take ibuprofen and Tylenol to help with pain symptoms and do intermittent stretches throughout the day for hamstring.  Please follow-up with orthopedics in 1 week if symptoms have not improved.

## 2023-08-19 NOTE — ED Triage Notes (Signed)
Pt comes with c/o fall. Pt states she tripped over a rug and fell. Pt states she fell forward and heard her right leg pop. Pt denies any loc or hitting head.

## 2023-08-19 NOTE — ED Notes (Signed)
See triage notes. Patient c/o right leg pain secondary to a fall. Patient stated her "shoe didn't grip" and she heard a pop. Patient stated she is unable to bear weight on the leg

## 2023-08-19 NOTE — ED Provider Notes (Signed)
[facility] Provider Note    Event Date/Time   First MD Initiated Contact with Patient 08/19/23 1629     (approximate)   History   Fall   HPI Brittney Sanders is a 37 y.o. female presenting today for fall.  Patient states her legs slipped while walking on a rug and she noticed a pop around her right hip and the back of her right leg.  States she has been having severe pain to that leg since the injury.  She has been able to walk on it.  Denies any numbness or tingling sensation in that leg.  Denies any trauma lower down the leg or pain elsewhere.  No trauma to the head or the rest of her body.     Physical Exam   Triage Vital Signs: ED Triage Vitals  Encounter Vitals Group     BP 08/19/23 1548 132/84     Systolic BP Percentile --      Diastolic BP Percentile --      Pulse Rate 08/19/23 1548 80     Resp 08/19/23 1548 19     Temp 08/19/23 1548 98.5 F (36.9 C)     Temp Source 08/19/23 1548 Oral     SpO2 08/19/23 1548 100 %     Weight --      Height --      Head Circumference --      Peak Flow --      Pain Score 08/19/23 1544 10     Pain Loc --      Pain Education --      Exclude from Growth Chart --     Most recent vital signs: Vitals:   08/19/23 1548  BP: 132/84  Pulse: 80  Resp: 19  Temp: 98.5 F (36.9 C)  SpO2: 100%   I have reviewed the vital signs. General:  Awake, alert, no acute distress. Head:  Normocephalic, Atraumatic. EENT:  PERRL, EOMI, Oral mucosa pink and moist, Neck is supple. Cardiovascular: Regular rate, 2+ distal pulses. Respiratory:  Normal respiratory effort, symmetrical expansion, no distress.   Extremities: Mild tenderness palpation at right anterior hip.  Some pain with leg roll.  Pain with flexion of the right leg which seems most prominently in the posterior thigh.  No tenderness palpation at right knee, right lower leg, or right ankle.  Full range of motion without pain through those joints.  Sensation intact  throughout bilateral lower extremities. Neuro:  Alert and oriented.  Interacting appropriately.   Skin:  Warm, dry, no rash.   Psych: Appropriate affect.    ED Results / Procedures / Treatments   Labs (all labs ordered are listed, but only abnormal results are displayed) Labs Reviewed - No data to display   EKG    RADIOLOGY Independently interpreted x-rays with no acute pathology   PROCEDURES:  Critical Care performed: No  Procedures   MEDICATIONS ORDERED IN ED: Medications  oxyCODONE-acetaminophen (PERCOCET/ROXICET) 5-325 MG per tablet 1 tablet (1 tablet Oral Given 08/19/23 1649)     IMPRESSION / MDM / ASSESSMENT AND PLAN / ED COURSE  I reviewed the triage vital signs and the nursing notes.                              Differential diagnosis includes, but is not limited to, femoral neck fracture, femur fracture, hamstring strain versus tear.  Patient's presentation is most consistent with  acute, uncomplicated illness.  Patient is a 37 year old female presenting today for right leg injury from a fall.  X-ray showed no acute osseous injuries.  Patient is able to ambulate with minimal pain symptoms.  Suspect possible hamstring tear given injury most prominent to the posterior thigh.  Was given crutches to help with ambulation.  Plan for discharge with symptomatic treatment.  Given orthopedic follow-up as needed if symptoms persisting 1 week from now.  Otherwise given strict return precautions.  Clinical Course as of 08/19/23 1901  Thu Aug 19, 2023  1823 Reassessed patient.  Pain symptoms minimal.  Inability interpreted x-rays of right hip and right femur with no obvious fractures.  Will give patient crutches for ambulation and plan for discharge with orthopedic follow-up [DW]    Clinical Course User Index [DW] Janith Lima, MD     FINAL CLINICAL IMPRESSION(S) / ED DIAGNOSES   Final diagnoses:  Right leg pain     Rx / DC Orders   ED Discharge Orders      None        Note:  This document was prepared using Dragon voice recognition software and may include unintentional dictation errors.   Janith Lima, MD 08/19/23 Mikle Bosworth

## 2023-08-29 NOTE — Unmapped (Signed)
[facility] Refill Coordination Note    Specialty Medication(s) to be Shipped:   Inflammatory Disorders: Dupixent    Other medication(s) to be shipped: No additional medications requested for fill at this time     Samantha Miles, DOB: [birth year]  Phone: [phone] (home)       All above HIPAA information was verified with patient.     Was a Nurse, learning disability used for this call? No    Completed refill call assessment today to schedule patient's medication shipment from the [facility] [phone]).  All relevant notes have been reviewed.     Specialty medication(s) and dose(s) confirmed: Regimen is correct and unchanged.   Changes to medications: Kloi reports no changes at this time.  Changes to insurance: No  New side effects reported not previously addressed with a pharmacist or physician: None reported  Questions for the pharmacist: No    Confirmed patient received a Conservation officer, historic buildings and a Surveyor, mining with first shipment. The patient will receive a drug information handout for each medication shipped and additional FDA Medication Guides as required.       DISEASE/MEDICATION-SPECIFIC INFORMATION        For patients on injectable medications: Patient currently has 0 doses left.  Next injection is scheduled for 9/29. Patient will take missed dose upon delivery.    SPECIALTY MEDICATION ADHERENCE     Medication Adherence    Patient reported X missed doses in the last month: 1-2  Specialty Medication: dupilumab 300 mg/2 mL PnIj  Patient is on additional specialty medications: No  Informant: patient              Were doses missed due to medication being on hold? No    Dupixent 300/2 mg/ml: 0 days of medicine on hand       REFERRAL TO PHARMACIST     Referral to the pharmacist: Not needed      [facility]     Shipping address confirmed in Epic.     Patient was notified of new phone menu : Yes: .    Delivery Scheduled: Yes, Expected medication delivery date: 9/30. Medication will be delivered via Same Day Courier to the prescription address in Epic WAM.    [name]   [facility] Specialty Technician

## 2023-08-30 MED FILL — DUPIXENT 300 MG/2 ML SUBCUTANEOUS PEN INJECTOR: SUBCUTANEOUS | 28 days supply | Qty: 4 | Fill #6

## 2023-10-05 NOTE — Unmapped (Signed)
[facility] Refill Coordination Note    Specialty Medication(s) to be Shipped:   Inflammatory Disorders: Dupixent    Other medication(s) to be shipped: No additional medications requested for fill at this time     Samantha Miles, DOB: [birth year]  Phone: [phone] (home)       All above HIPAA information was verified with patient.     Was a Nurse, learning disability used for this call? No    Completed refill call assessment today to schedule patient's medication shipment from the [facility] [phone]).  All relevant notes have been reviewed.     Specialty medication(s) and dose(s) confirmed: Regimen is correct and unchanged.   Changes to medications: [name] reports no changes at this time.  Changes to insurance: No  New side effects reported not previously addressed with a pharmacist or physician: None reported  Questions for the pharmacist: No    Confirmed patient received a Conservation officer, historic buildings and a Surveyor, mining with first shipment. The patient will receive a drug information handout for each medication shipped and additional FDA Medication Guides as required.       DISEASE/MEDICATION-SPECIFIC INFORMATION        For patients on injectable medications: Patient currently has 0 doses left.  Next injection is scheduled for 11/10.     SPECIALTY MEDICATION ADHERENCE     Medication Adherence    Patient reported X missed doses in the last month: 0  Specialty Medication: dupilumab 300 mg/2 mL PnIj  Patient is on additional specialty medications: No  Informant: patient              Were doses missed due to medication being on hold? No    Dupixent 300/2 mg/ml: 0 days of medicine on hand       REFERRAL TO PHARMACIST     Referral to the pharmacist: Not needed      [facility]     Shipping address confirmed in Epic.     Patient was notified of new phone menu : Yes: .    Delivery Scheduled: Yes, Expected medication delivery date: 11/07.     Medication will be delivered via Same Day Courier to the prescription address in Epic WAM.    [name]   [facility] Specialty Technician

## 2023-10-07 MED FILL — DUPIXENT 300 MG/2 ML SUBCUTANEOUS PEN INJECTOR: SUBCUTANEOUS | 28 days supply | Qty: 4 | Fill #7

## 2023-12-04 NOTE — Unmapped (Signed)
[facility] Refill Coordination Note    Specialty Medication(s) to be Shipped:   Inflammatory Disorders: Dupixent    Other medication(s) to be shipped: No additional medications requested for fill at this time     Samantha Miles, DOB: 05-Feb-1986  Phone: [phone] (home)       All above HIPAA information was verified with patient.     Was a Nurse, learning disability used for this call? No    Completed refill call assessment today to schedule patient's medication shipment from the [facility] [phone]).  All relevant notes have been reviewed.     Specialty medication(s) and dose(s) confirmed: Regimen is correct and unchanged.   Changes to medications: Samantha Miles reports no changes at this time.  Changes to insurance: No Urgent PA renewal request sent to Triage.  New side effects reported not previously addressed with a pharmacist or physician: None reported  Questions for the pharmacist: No    Confirmed patient received a Conservation officer, historic buildings and a Surveyor, mining with first shipment. The patient will receive a drug information handout for each medication shipped and additional FDA Medication Guides as required.       DISEASE/MEDICATION-SPECIFIC INFORMATION        For patients on injectable medications: Patient currently has 0 doses left.  Next injection is scheduled for 1/05.     SPECIALTY MEDICATION ADHERENCE     Medication Adherence    Patient reported X missed doses in the last month: 0  Specialty Medication: dupilumab 300 mg/2 mL PnIj  Patient is on additional specialty medications: No  Informant: patient              Were doses missed due to medication being on hold? No    Dupixent 300/2 mg/ml: 0 days of medicine on hand       REFERRAL TO PHARMACIST     Referral to the pharmacist: Not needed      [facility]     Shipping address confirmed in Epic.     Delivery Scheduled: Yes, Expected medication delivery date: 1/08.     Medication will be delivered via Same Day Courier to the prescription address in Epic WAM.    [name]   [facility] Specialty Technician

## 2023-12-05 DIAGNOSIS — L209 Atopic dermatitis, unspecified: Principal | ICD-10-CM

## 2023-12-08 MED FILL — DUPIXENT 300 MG/2 ML SUBCUTANEOUS PEN INJECTOR: SUBCUTANEOUS | 28 days supply | Qty: 4 | Fill #8

## 2023-12-18 ENCOUNTER — Emergency Department
Admit: 2023-12-18 | Discharge: 2023-12-18 | Disposition: A | Discharge status: Home / Self Care | Payer: MEDICARE | Attending: Emergency Medicine

## 2023-12-18 LAB — URINALYSIS WITH MICROSCOPY WITH CULTURE REFLEX PERFORMABLE
BACTERIA: NONE SEEN /HPF
BILIRUBIN UA: NEGATIVE
BLOOD UA: NEGATIVE
GLUCOSE UA: NEGATIVE
KETONES UA: NEGATIVE
LEUKOCYTE ESTERASE UA: NEGATIVE
NITRITE UA: NEGATIVE
PH UA: 6.5 (ref 5.0–9.0)
PROTEIN UA: NEGATIVE
RBC UA: 1 /HPF (ref <=4)
SPECIFIC GRAVITY UA: 1.02 (ref 1.003–1.030)
SQUAMOUS EPITHELIAL: 1 /HPF (ref 0–5)
UROBILINOGEN UA: <2
WBC UA: 2 /HPF (ref 0–5)

## 2023-12-18 LAB — PREGNANCY, URINE: PREGNANCY TEST URINE: NEGATIVE

## 2023-12-18 MED ADMIN — ibuprofen (MOTRIN) tablet 600 mg: 600 mg | ORAL | @ 09:00:00 | Stop: 2023-12-18

## 2023-12-18 MED ADMIN — acetaminophen (TYLENOL) tablet 1,000 mg: 1000 mg | ORAL | @ 09:00:00 | Stop: 2023-12-18
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
## 2023-12-18 NOTE — Unmapped (Addendum)
Pt reports right buttock/leg pain and foul smelling urine.

## 2023-12-22 ENCOUNTER — Other Ambulatory Visit: Payer: Self-pay

## 2023-12-22 ENCOUNTER — Encounter: Payer: Self-pay | Admitting: Emergency Medicine

## 2023-12-22 ENCOUNTER — Emergency Department
Admission: EM | Admit: 2023-12-22 | Discharge: 2023-12-22 | Disposition: A | Discharge status: Home / Self Care | Payer: 59 | Attending: Emergency Medicine | Admitting: Emergency Medicine

## 2023-12-22 ENCOUNTER — Emergency Department: Payer: 59

## 2023-12-22 DIAGNOSIS — S79921A Unspecified injury of right thigh, initial encounter: Secondary | ICD-10-CM | POA: Diagnosis present

## 2023-12-22 DIAGNOSIS — S76911A Strain of unspecified muscles, fascia and tendons at thigh level, right thigh, initial encounter: Secondary | ICD-10-CM | POA: Diagnosis not present

## 2023-12-22 DIAGNOSIS — T148XXA Other injury of unspecified body region, initial encounter: Secondary | ICD-10-CM

## 2023-12-22 MED ORDER — NAPROXEN 500 MG PO TABS: 500.0000 mg | ORAL_TABLET | Freq: Two times a day (BID) | ORAL | 2 refills | Status: AC

## 2023-12-22 MED ORDER — KETOROLAC TROMETHAMINE 30 MG/ML IJ SOLN
30.0000 mg | Freq: Once | INTRAMUSCULAR | Status: AC
  Administered 2023-12-22: 30 mg via INTRAMUSCULAR
  Filled 2023-12-22: qty 1

## 2023-12-22 MED ORDER — METHOCARBAMOL 500 MG PO TABS: 500.0000 mg | ORAL_TABLET | Freq: Three times a day (TID) | ORAL | 1 refills | Status: AC | PRN
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
## 2024-01-07 ENCOUNTER — Ambulatory Visit: Payer: Self-pay | Admitting: Physician Assistant

## 2024-01-07 NOTE — Unmapped (Signed)
[facility] Refill Coordination Note    Specialty Medication(s) to be Shipped:   Inflammatory Disorders: Dupixent    Other medication(s) to be shipped: No additional medications requested for fill at this time     Samantha Miles, DOB: 03/08/1986  Phone: [phone] (home)       All above HIPAA information was verified with patient.     Was a Nurse, learning disability used for this call? No    Completed refill call assessment today to schedule patient's medication shipment from the [facility] [phone]).  All relevant notes have been reviewed.     Specialty medication(s) and dose(s) confirmed: Regimen is correct and unchanged.   Changes to medications: [name] reports no changes at this time.  Changes to insurance: No Urgent PA renewal request sent to Triage.  New side effects reported not previously addressed with a pharmacist or physician: None reported  Questions for the pharmacist: No    Confirmed patient received a Conservation officer, historic buildings and a Surveyor, mining with first shipment. The patient will receive a drug information handout for each medication shipped and additional FDA Medication Guides as required.       DISEASE/MEDICATION-SPECIFIC INFORMATION        For patients on injectable medications: Patient currently has 0 doses left.  Next injection is scheduled for 2/09.     SPECIALTY MEDICATION ADHERENCE     Medication Adherence    Patient reported X missed doses in the last month: 0  Specialty Medication: dupilumab 300 mg/2 mL PnIj  Patient is on additional specialty medications: No  Informant: patient              Were doses missed due to medication being on hold? No    Dupixent 300/2 mg/ml: 0 days of medicine on hand       REFERRAL TO PHARMACIST     Referral to the pharmacist: Not needed      [facility]     Shipping address confirmed in Epic.     Delivery Scheduled: Yes, Expected medication delivery date: 2/10.     Medication will be delivered via Same Day Courier to the prescription address in Epic WAM.    [name]   [facility] Specialty Technician

## 2024-01-10 MED FILL — DUPIXENT 300 MG/2 ML SUBCUTANEOUS PEN INJECTOR: SUBCUTANEOUS | 28 days supply | Qty: 4 | Fill #9

## 2024-01-26 NOTE — Unmapped (Signed)
 [facility] Refill Coordination Note    Specialty Medication(s) to be Shipped:   Inflammatory Disorders: Dupixent    Other medication(s) to be shipped: No additional medications requested for fill at this time     Samantha Miles, DOB: 1986/10/10  Phone: [phone] (home)       All above HIPAA information was verified with patient.     Was a Nurse, learning disability used for this call? No    Completed refill call assessment today to schedule patient's medication shipment from the [facility] [phone]).  All relevant notes have been reviewed.     Specialty medication(s) and dose(s) confirmed: Regimen is correct and unchanged.   Changes to medications: Datha reports no changes at this time.  Changes to insurance: No Urgent PA renewal request sent to Triage.  New side effects reported not previously addressed with a pharmacist or physician: None reported  Questions for the pharmacist: No    Confirmed patient received a Conservation officer, historic buildings and a Surveyor, mining with first shipment. The patient will receive a drug information handout for each medication shipped and additional FDA Medication Guides as required.       DISEASE/MEDICATION-SPECIFIC INFORMATION        For patients on injectable medications: Patient currently has 0 doses left.  Next injection is scheduled for 3/10.     SPECIALTY MEDICATION ADHERENCE     Medication Adherence    Patient reported X missed doses in the last month: 0  Specialty Medication: dupilumab 300 mg/2 mL PnIj  Patient is on additional specialty medications: No  Informant: patient              Were doses missed due to medication being on hold? No    Dupixent 300/2 mg/ml: 0 days of medicine on hand       REFERRAL TO PHARMACIST     Referral to the pharmacist: Not needed      [facility]     Shipping address confirmed in Epic.     Delivery Scheduled: Yes, Expected medication delivery date: 3/3.     Medication will be delivered via Same Day Courier to the prescription address in Epic WAM.    [name]   [facility] Specialty Technician

## 2024-01-31 ENCOUNTER — Emergency Department
Admission: EM | Admit: 2024-01-31 | Discharge: 2024-01-31 | Disposition: A | Discharge status: Home / Self Care | Attending: Emergency Medicine | Admitting: Emergency Medicine

## 2024-01-31 ENCOUNTER — Other Ambulatory Visit: Payer: Self-pay

## 2024-01-31 DIAGNOSIS — R1012 Left upper quadrant pain: Secondary | ICD-10-CM | POA: Diagnosis present

## 2024-01-31 LAB — CBC
HCT: 43.7 % (ref 36.0–46.0)
Hemoglobin: 14.7 g/dL (ref 12.0–15.0)
MCH: 29.7 pg (ref 26.0–34.0)
MCHC: 33.6 g/dL (ref 30.0–36.0)
MCV: 88.3 fL (ref 80.0–100.0)
Platelets: 396 10*3/uL (ref 150–400)
RBC: 4.95 MIL/uL (ref 3.87–5.11)
RDW: 13.4 % (ref 11.5–15.5)
WBC: 8.9 10*3/uL (ref 4.0–10.5)
nRBC: 0 % (ref 0.0–0.2)

## 2024-01-31 LAB — COMPREHENSIVE METABOLIC PANEL
ALT: 19 U/L (ref 0–44)
AST: 20 U/L (ref 15–41)
Albumin: 3.9 g/dL (ref 3.5–5.0)
Alkaline Phosphatase: 65 U/L (ref 38–126)
Anion gap: 8 (ref 5–15)
BUN: 8 mg/dL (ref 6–20)
CO2: 27 mmol/L (ref 22–32)
Calcium: 9.1 mg/dL (ref 8.9–10.3)
Chloride: 105 mmol/L (ref 98–111)
Creatinine, Ser: 0.93 mg/dL (ref 0.44–1.00)
GFR, Estimated: >60 mL/min (ref >60)
Glucose, Bld: 95 mg/dL (ref 70–99)
Potassium: 3.7 mmol/L (ref 3.5–5.1)
Sodium: 140 mmol/L (ref 135–145)
Total Bilirubin: 0.6 mg/dL (ref 0.0–1.2)
Total Protein: 7.1 g/dL (ref 6.5–8.1)

## 2024-01-31 LAB — URINALYSIS, ROUTINE W REFLEX MICROSCOPIC
Bilirubin Urine: NEGATIVE
Glucose, UA: NEGATIVE mg/dL
Hgb urine dipstick: NEGATIVE
Ketones, ur: NEGATIVE mg/dL
Leukocytes,Ua: NEGATIVE
Nitrite: NEGATIVE
Protein, ur: NEGATIVE mg/dL
Specific Gravity, Urine: 1.013 (ref 1.005–1.030)
pH: 7 (ref 5.0–8.0)

## 2024-01-31 LAB — LIPASE, BLOOD: Lipase: 29 U/L (ref 11–51)

## 2024-01-31 MED ORDER — DICYCLOMINE HCL 10 MG PO CAPS: 10.0000 mg | ORAL_CAPSULE | Freq: Three times a day (TID) | ORAL | 0 refills | Status: AC

## 2024-01-31 MED ORDER — ONDANSETRON 4 MG PO TBDP: 4.0000 mg | ORAL_TABLET | Freq: Three times a day (TID) | ORAL | 0 refills | Status: AC | PRN

## 2024-01-31 MED ORDER — DICYCLOMINE HCL 10 MG PO CAPS
10.0000 mg | ORAL_CAPSULE | Freq: Once | ORAL | Status: AC
  Administered 2024-01-31: 10 mg via ORAL
  Filled 2024-01-31: qty 1

## 2024-01-31 MED ORDER — ONDANSETRON 4 MG PO TBDP
4.0000 mg | ORAL_TABLET | Freq: Once | ORAL | Status: AC
  Administered 2024-01-31: 4 mg via ORAL
  Filled 2024-01-31: qty 1

## 2024-01-31 MED FILL — DUPIXENT 300 MG/2 ML SUBCUTANEOUS PEN INJECTOR: SUBCUTANEOUS | 28 days supply | Qty: 4 | Fill #10

## 2024-01-31 NOTE — ED Triage Notes (Signed)
 Patient states left sided abdominal pain and nausea since 0400.

## 2024-01-31 NOTE — ED Provider Notes (Signed)
 [facility] Emergency Department Provider Note     Event Date/Time   First MD Initiated Contact with Patient 01/31/24 1107     (approximate)   History   Abdominal Pain   HPI  Brittney Sanders is a 38 y.o. female presents to the ED for evaluation of abdominal pain that began about 0400 this morning.  Patient would endorse left upper quadrant pain that she describes as 6 out of 10 at onset.  She endorses constant discomfort that is worsened by standing.  She describes this as sharp pain that wraps around her side.  Patient reports she has had normal p.o. intake including water and Pepsi, but has had very little urination at this time.  Patient denies any fevers, chills, sweats, chest pain.  No reports of nausea, vomiting, bowel changes. Surgical history includes a vaginal hysterectomy and a tubal ligation.   Physical Exam   Triage Vital Signs: ED Triage Vitals  Encounter Vitals Group     BP 01/31/24 1006 (!) 133/92     Systolic BP Percentile --      Diastolic BP Percentile --      Pulse Rate 01/31/24 1005 84     Resp 01/31/24 1005 20     Temp 01/31/24 1005 98.4 F (36.9 C)     Temp Source 01/31/24 1005 Oral     SpO2 01/31/24 1005 100 %     Weight --      Height --      Head Circumference --      Peak Flow --      Pain Score 01/31/24 1005 10     Pain Loc --      Pain Education --      Exclude from Growth Chart --     Most recent vital signs: Vitals:   01/31/24 1215 01/31/24 1222  BP:  116/68  Pulse: 86   Resp: 16   Temp: 98.3 F (36.8 C)   SpO2: 98%     General Awake, no distress. NAD HEENT NCAT. PERRL. EOMI. No rhinorrhea. Mucous membranes are moist.  CV:  Good peripheral perfusion. RRR RESP:  Normal effort. CTA ABD:  No distention.  No rebound, guarding, rigidity noted.  Normoactive bowel sounds x 4.  No CVA tenderness elicited.  ED Results / Procedures / Treatments   Labs (all labs ordered are listed, but only abnormal results  are displayed) Labs Reviewed  URINALYSIS, ROUTINE W REFLEX MICROSCOPIC - Abnormal; Notable for the following components:      Result Value   Color, Urine YELLOW (*)    APPearance CLEAR (*)    All other components within normal limits  LIPASE, BLOOD  COMPREHENSIVE METABOLIC PANEL  CBC     EKG   RADIOLOGY  No results found.   PROCEDURES:  Critical Care performed: No  Procedures   MEDICATIONS ORDERED IN ED: Medications  ondansetron (ZOFRAN-ODT) disintegrating tablet 4 mg (4 mg Oral Given 01/31/24 1213)  dicyclomine (BENTYL) capsule 10 mg (10 mg Oral Given 01/31/24 1216)     IMPRESSION / MDM / ASSESSMENT AND PLAN / ED COURSE  I reviewed the triage vital signs and the nursing notes.                              Differential diagnosis includes, but is not limited to, biliary disease (biliary colic, acute cholecystitis, cholangitis, choledocholithiasis, etc), intrathoracic causes for epigastric abdominal  pain including ACS, gastritis, duodenitis, pancreatitis, small bowel or large bowel obstruction, abdominal aortic aneurysm, hernia, and ulcer(s).   Patient's presentation is most consistent with acute complicated illness / injury requiring diagnostic workup.  Patient's diagnosis is consistent with abdominal pain of unclear etiology.  Patient with reassuring exam and workup at this time.  Signs are stable and reassuring that she is afebrile without evidence of tachycardia.  Labs overall reassuring with no evidence of acute leukocytosis, critical anemia, or electrolyte abnormality.  Patient without any intractable or persistent nausea, vomiting, diarrhea in the ED.  She stable throughout her course, and no team no worsening of her symptoms.  Symptoms may represent a gastritis or viral gastroenteritis.  Patient Sanders be discharged home with prescriptions for dicyclomine and Zofran. Patient is to follow up with her PCP as discussed, as needed or otherwise directed. Patient is given ED  precautions to return to the ED for any worsening or new symptoms.   FINAL CLINICAL IMPRESSION(S) / ED DIAGNOSES   Final diagnoses:  Left upper quadrant abdominal pain     Rx / DC Orders   ED Discharge Orders          Ordered    ondansetron (ZOFRAN-ODT) 4 MG disintegrating tablet  Every 8 hours PRN        01/31/24 1159    dicyclomine (BENTYL) 10 MG capsule  3 times daily before meals        01/31/24 1159             Note:  This document was prepared using Dragon voice recognition software and may include unintentional dictation errors.    Lissa Hoard, PA-C 02/01/24 1849    Merwyn Katos, MD 02/01/24 [phone]

## 2024-01-31 NOTE — ED Notes (Signed)
 Pt states that their pain started around 0400 this morning and it is on the LUQ and is a 6/10. Pt states that it is constant but get worse when they stand and feels like a sharp pain that wraps their side. Pt also states that they feel bloated when they stand. Pt states that they have had a large bottle of water today and a small amount of pepsi but have urinated very little. Pt has had no issues with BM and had one this morning. Pt is A&Ox4 and in NAD at this time.

## 2024-01-31 NOTE — Discharge Instructions (Addendum)
 Your exam and labs are normal and reassuring at this time.  No evidence of any abnormalities to your blood work.  There is no indication of a UTI.  Symptoms may be due to's abdominal cramping or a possible viral infection.  Take prescription meds as directed.  Increase your fluid intake and follow-up with your primary provider as discussed.

## 2024-02-21 DIAGNOSIS — L209 Atopic dermatitis, unspecified: Principal | ICD-10-CM

## 2024-02-21 MED ORDER — DUPIXENT 300 MG/2 ML SUBCUTANEOUS PEN INJECTOR
SUBCUTANEOUS | 11 refills | 28 days
Start: 2024-02-21 — End: ?

## 2024-02-21 NOTE — Unmapped (Signed)
 Patient need to be seen for more medication

## 2024-02-23 NOTE — Unmapped (Signed)
..  Samantha Miles has been contacted in regards to their refill of Dupixent. At this time, we are unable to refill due to  Prescription is expired . Request sent to Provider was denied until Patient has an appointment. Spoke with Patient about setting up appointment and asking for a bridge fill to hold her over until she can be seen in office. Forde Radon

## 2024-03-01 NOTE — Unmapped (Signed)
 Specialty Medication(s): Dupixent    [name] has been dis-enrolled from the [facility] specialty pharmacy services as a result of  No active rx. Pharmacy staff informed patient of need for appt for more refills  .    Additional information provided to the patient: If/when new rx received, we'll re-enroll in calls.     Samantha Miles [facility] Specialty Pharmacist

## 2024-03-28 ENCOUNTER — Ambulatory Visit: Admit: 2024-03-28 | Discharge: 2024-03-29 | Payer: Medicare (Managed Care)

## 2024-03-28 DIAGNOSIS — L719 Rosacea, unspecified: Principal | ICD-10-CM

## 2024-03-28 DIAGNOSIS — L209 Atopic dermatitis, unspecified: Principal | ICD-10-CM

## 2024-03-28 MED ORDER — METRONIDAZOLE 0.75 % TOPICAL CREAM
Freq: Two times a day (BID) | TOPICAL | 5 refills | 0.00000 days | Status: CP
Start: 2024-03-28 — End: ?

## 2024-03-28 MED ORDER — BETAMETHASONE DIPROPIONATE 0.05 % TOPICAL CREAM
TOPICAL | 3 refills | 0.00000 days | Status: CP
Start: 2024-03-28 — End: ?

## 2024-03-28 MED ORDER — DUPILUMAB 300 MG/2 ML SUBCUTANEOUS PEN INJECTOR
SUBCUTANEOUS | 11 refills | 56.00000 days | Status: CP
Start: 2024-03-28 — End: 2024-03-28
  Filled 2024-04-12: qty 4, 30d supply, fill #0

## 2024-04-11 NOTE — Unmapped (Deleted)
 Clinical Assessment Needed For: Dose Change  Medication: DUPIXENT PEN 300 mg/2 mL pen injector (dupilumab)  Last Fill Date/Day Supply: 01/31/24 / 28 days  Copay $0  Was previous dose already scheduled to fill: No    Notes to Pharmacist: Max day supply to run on ins is 30 per ins rep Brandy - actually 56 day supply

## 2024-04-11 NOTE — Unmapped (Signed)
 [facility] Specialty Medication Onboarding    Specialty Medication: Dupixent  Prior Authorization: Approved   Financial Assistance: No - copay  <$25  Final Copay/Day Supply: $0 / 56 days    Insurance Restrictions: Yes - max 1 month supply     Notes to Pharmacist: Per Rep Brandy max day supply that we can run is 30 - actually 56 day supply  Credit Card on File: not applicable  Start Date on Rx:      The triage team has completed the benefits investigation and has determined that the patient is able to fill this medication at [facility]. Please contact the patient to complete the onboarding or follow up with the prescribing physician as needed.

## 2024-04-11 NOTE — Unmapped (Signed)
 Samantha Miles is resuming therapy after being seen for more refills. She reports overall she's done well since missing therapy and only a bit of eczema has returned on her forehead. She plans to decrease dosing to once every 4 weeks.     [facility] Clinical Assessment & Refill Coordination Note    Samantha Miles, DOB: 09-22-1986  Phone: [phone] (home)     All above HIPAA information was verified with patient.     Was a Nurse, learning disability used for this call? No    Specialty Medication(s):   Inflammatory Disorders: Dupixent     Current Outpatient Medications   Medication Sig Dispense Refill    betamethasone dipropionate (DIPROLENE) 0.05 % ointment betamethasone dipropionate 0.05 % topical ointment      betamethasone dipropionate 0.05 % cream Apply twice a day to eczema flare until the skin feels smooth, then stop. Do not apply to the skin on the face, underarms or groin. 30 g 3    betamethasone, augmented, (DIPROLENE) 0.05 % cream Apply twice daily to areas of eczema until flat and smooth. Avoid face and skin folds 50 g 5    clobetasoL (TEMOVATE) 0.05 % ointment Apply to eczema on hands at night until resolved. 60 g 5    dupilumab (DUPIXENT) 300 mg/2 mL pen injector Inject the contents of 1 pen (300 mg total) under the skin every twenty-eight (28) days. 4 mL 11    ibuprofen (ADVIL,MOTRIN) 800 MG tablet TAKE 1 TABLET BY MOUTH EVERY 8 HOURS AS NEEDED FOR MODERATE PAIN  0    meloxicam (MOBIC) 15 MG tablet Mobic 15 mg tablet   Take 1 tablet(s) every day by oral route.      metroNIDAZOLE (METROCREAM) 0.75 % cream Apply topically two (2) times a day. Apply to face. 60 g 5    ondansetron (ZOFRAN) 4 MG tablet Take 1 tablet (4 mg total) by mouth.      TRUEPLUS PEN NEEDLE 31 gauge x 1/4 (6 mm) Ndle       VICTOZA 2-PAK 0.6 mg/0.1 mL (18 mg/3 mL) injection        No current facility-administered medications for this visit.        Changes to medications: Billiejean reports no changes at this time.    Medication list has been reviewed and updated in Epic: Yes    Allergies   Allergen Reactions    Sulfacetamide Sodium Hives and Rash    Latex Rash     gloves    Sulfamethoxazole Rash       Changes to allergies: No    Allergies have been reviewed and updated in Epic: Yes    SPECIALTY MEDICATION ADHERENCE     Dupixent - 0 left  Medication Adherence    Patient reported X missed doses in the last month: 1  Specialty Medication: Dupixent          Specialty medication(s) dose(s) confirmed: dose decrease with this fill to 1 pen every 4 weeks - patient aware     Are there any concerns with adherence? No    Adherence counseling provided? Not needed    CLINICAL MANAGEMENT AND INTERVENTION      Clinical Benefit Assessment:    Do you feel the medicine is effective or helping your condition? Yes    Clinical Benefit counseling provided? Not needed    Adverse Effects Assessment:    Are you experiencing any side effects? No    Are you experiencing difficulty administering  your medicine? No    Quality of Life Assessment:    Quality of Life    Rheumatology  Oncology  Dermatology  1. What impact has your specialty medication had on the symptoms of your skin condition (i.e. itchiness, soreness, stinging)?: Tremendous  2. What impact has your specialty medication had on your comfort level with your skin?: Tremendous  Cystic Fibrosis          How many days over the past month did your AD  keep you from your normal activities? For example, brushing your teeth or getting up in the morning. Patient declined to answer    Have you discussed this with your provider? Not needed    Acute Infection Status:    Acute infections noted within Epic:  No active infections    Patient reported infection: None    Therapy Appropriateness:    Is therapy appropriate based on current medication list, adverse reactions, adherence, clinical benefit and progress toward achieving therapeutic goals? Yes, therapy is appropriate and should be continued     Clinical Intervention:    Was an intervention completed as part of this clinical assessment? No    DISEASE/MEDICATION-SPECIFIC INFORMATION      For patients on injectable medications: Patient currently has 0 doses left.  Next injection is scheduled for ASAP (overdue, needed to be seen for more).    Chronic Inflammatory Diseases: Have you experienced any flares in the last month? Yes, slight - due to missing dose  Has this been reported to your provider? Yes, had recent visit    PATIENT SPECIFIC NEEDS     Does the patient have any physical, cognitive, or cultural barriers? No    Is the patient high risk? No    Does the patient require physician intervention or other additional services (i.e., nutrition, smoking cessation, social work)? No    Does the patient have an additional or emergency contact listed in their chart? Yes    SOCIAL DETERMINANTS OF HEALTH     At the [facility], we have learned that life circumstances - like trouble affording food, housing, utilities, or transportation can affect the health of many of our patients.   That is why we wanted to ask: are you currently experiencing any life circumstances that are negatively impacting your health and/or quality of life? Patient declined to answer    Social Drivers of Health     Food Insecurity: Not on file   Tobacco Use: Low Risk  (03/28/2024)    Patient History     Smoking Tobacco Use: Never     Smokeless Tobacco Use: Never     Passive Exposure: Not on file   Transportation Needs: Not on file   Alcohol Use: Not on file   Housing: Not on file   Physical Activity: Not on file   Utilities: Not on file   Stress: Not on file   Interpersonal Safety: Not on file   Substance Use: Not on file (10/05/2023)   Intimate Partner Violence: Not on file   Social Connections: Not on file   Financial Resource Strain: Not on file   Health Literacy: Not on file   Internet Connectivity: Not on file       Would you be willing to receive help with any of the needs that you have identified today? Not applicable       SHIPPING     Specialty Medication(s) to be Shipped:   Inflammatory Disorders: Dupixent    Other medication(s)  to be shipped: No additional medications requested for fill at this time     Changes to insurance: No    Cost and Payment: Patient has a $0 copay, payment information is not required.    Delivery Scheduled: Yes, Expected medication delivery date: 5/14.     Medication will be delivered via Same Day Courier to the confirmed prescription address in [facility].    The patient will receive a drug information handout for each medication shipped and additional FDA Medication Guides as required.  Verified that patient has previously received a Conservation officer, historic buildings and a Surveyor, mining.    The patient or caregiver noted above participated in the development of this care plan and knows that they can request review of or adjustments to the care plan at any time.      All of the patient's questions and concerns have been addressed.    Kenyette Gundy [facility] Specialty Pharmacist

## 2024-06-01 NOTE — Unmapped (Signed)
 The [facility] has made a second and final attempt to reach this patient to refill the following medication:Dupixent .      We have left voicemails on the following phone numbers: [phone], have sent a MyChart message, and have sent a text message to the following phone numbers: [phone].    Dates contacted: 05/24/2024  06/01/2024  Last scheduled delivery: 04/13/2024    The patient may be at risk of non-compliance with this medication. The patient should call the [facility] at [phone]  Option 4, then Option 2: Dermatology, Gastroenterology, Rheumatology to refill medication.    Jeoffrey JAYSON Sherra UNK Karel armin Corean Jeppie Venson Karel Nathanael

## 2024-06-06 NOTE — Unmapped (Signed)
 [facility] Delivery Pharm[facility] Refill Coordination Note    Specialty Medication(s) to be Shipped:   Inflammatory Disorders: Dupixent     Other medication(s) to be shipped: No additional medications requested for fill at this time     Samantha Miles, DOB: 1986/09/27  Phone: [phone] (home)       All above HIPAA information was verified with patient.     Was a Nurse, learning disability used for this call? No    Completed refill call assessment today to schedule patient's medication shipment from the [facility]  [phone]).  All relevant notes have been reviewed.     Specialty medication(s) and dose(s) confirmed: Regimen is correct and unchanged.   Changes to medications: Samantha Miles reports no changes at this time.  Changes to insurance: No  New side effects reported not previously addressed with a pharmacist or physician: None reported  Questions for the pharmacist: No    Confirmed patient received a Conservation officer, historic buildings and a Surveyor, mining with first shipment. The patient will receive a drug information handout for each medication shipped and additional FDA Medication Guides as required.       DISEASE/MEDICATION-SPECIFIC INFORMATION        For patients on injectable medications: Patient currently has 0 doses left.  Next injection is scheduled for 06/28/24 .    SPECIALTY MEDICATION ADHERENCE     Medication Adherence    Patient reported X missed doses in the last month: 0  Specialty Medication: DUPIXENT  PEN 300 mg/2 mL pen injector (dupilumab )  Patient is on additional specialty medications: No  Patient is on more than two specialty medications: No  Any gaps in refill history greater than 2 weeks in the last 3 months: no  Demonstrates understanding of importance of adherence: yes              Were doses missed due to medication being on hold? No          DUPIXENT  PEN 300 mg/2 mL pen injector (dupilumab ): 0  doses of medicine on hand        REFERRAL TO PHARMACIST     Referral to the pharmacist: Not needed      [facility]     Shipping address confirmed in Epic.     Cost and Payment: Patient has a $0 copay, payment information is not required.    Delivery Scheduled: Yes, Expected medication delivery date: 06/16/24 .     Medication will be delivered via Same Day Courier to the prescription address in Epic WAM.    Samantha Miles   [facility]  Specialty Technician

## 2024-06-16 MED FILL — DUPIXENT 300 MG/2 ML SUBCUTANEOUS PEN INJECTOR: SUBCUTANEOUS | 30 days supply | Qty: 4 | Fill #1

## 2024-07-19 NOTE — Unmapped (Signed)
 [facility] Delivery Pharm[facility] Refill Coordination Note    Specialty Medication(s) to be Shipped:   Inflammatory Disorders: Dupixent     Other medication(s) to be shipped: No additional medications requested for fill at this time     Samantha Miles, DOB: 03-21-1986  Phone: [phone] (home)       All above HIPAA information was verified with patient.     Was a Nurse, learning disability used for this call? No    Completed refill call assessment today to schedule patient's medication shipment from the [facility] Pharm[facility]  [phone]).  All relevant notes have been reviewed.     Specialty medication(s) and dose(s) confirmed: Regimen is correct and unchanged.   Changes to medications: [name] reports no changes at this time.  Changes to insurance: No  New side effects reported not previously addressed with a pharmacist or physician: None reported  Questions for the pharmacist: No    Confirmed patient received a Conservation officer, historic buildings and a Surveyor, mining with first shipment. The patient will receive a drug information handout for each medication shipped and additional FDA Medication Guides as required.       DISEASE/MEDICATION-SPECIFIC INFORMATION        For patients on injectable medications: Patient currently has 0 doses left.  Next injection is scheduled for 07/26/24 .    SPECIALTY MEDICATION ADHERENCE     Medication Adherence    Patient reported X missed doses in the last month: 0  Specialty Medication: dupilumab  (DUPIXENT ) 300 mg/2 mL pen injector  Patient is on additional specialty medications: No  Informant: patient              Were doses missed due to medication being on hold? No          DUPIXENT  PEN 300 mg/2 mL pen injector (dupilumab ): 0  doses of medicine on hand        REFERRAL TO PHARMACIST     Referral to the pharmacist: Not needed      [facility]     Shipping address confirmed in Epic.     Cost and Payment: Patient has a $0 copay, payment information is not required.    Delivery Scheduled: Yes, Expected medication delivery date: 07/24/24 .     Medication will be delivered via Same Day Courier to the prescription address in Epic WAM.    Samantha Miles UNK Specialty and [facility]

## 2024-07-24 MED FILL — DUPIXENT 300 MG/2 ML SUBCUTANEOUS PEN INJECTOR: SUBCUTANEOUS | 30 days supply | Qty: 4 | Fill #2

## 2024-08-24 NOTE — Unmapped (Signed)
 The [facility] has made a second and final attempt to reach this patient to refill the following medication:DUPIXENT  PEN 300 mg/2 mL pen injector (dupilumab ).      We have left voicemails on the following phone numbers: [phone], have sent a text message to the following phone numbers: [phone], and have sent a Mychart questionnaire..    Dates contacted: 08/15/24-0925/25  Last scheduled delivery: 07/24/24    The patient may be at risk of non-compliance with this medication. The patient should call the [facility] at [phone]  Option 4, then Option 2: Dermatology, Gastroenterology, Rheumatology to refill medication.    Dena LOISE Bonner UNK Specialty and Home Delivery Oncologist

## 2024-08-24 NOTE — Unmapped (Signed)
 [facility] Delivery Pharm[facility] Refill Coordination Note    Specialty Medication(s) to be Shipped:   Inflammatory Disorders: Dupixent     Other medication(s) to be shipped: No additional medications requested for fill at this time    Specialty Medications not needed at this time: N/A     Samantha Miles, DOB: [birth year]  Phone: [phone] (home)       All above HIPAA information was verified with patient.     Was a Nurse, learning disability used for this call? No    Completed refill call assessment today to schedule patient's medication shipment from the [facility]  [phone]).  All relevant notes have been reviewed.     Specialty medication(s) and dose(s) confirmed: Regimen is correct and unchanged.   Changes to medications: [name] reports no changes at this time.  Changes to insurance: No  New side effects reported not previously addressed with a pharmacist or physician: None reported  Questions for the pharmacist: No    Confirmed patient received a Conservation officer, historic buildings and a Surveyor, mining with first shipment. The patient will receive a drug information handout for each medication shipped and additional FDA Medication Guides as required.       DISEASE/MEDICATION-SPECIFIC INFORMATION        For patients on injectable medications: Next injection is scheduled for 08/27/2024.    SPECIALTY MEDICATION ADHERENCE     Medication Adherence    Patient reported X missed doses in the last month: 0  Specialty Medication: DUPIXENT  PEN 300 mg/2 mL pen injector (dupilumab )  Patient is on additional specialty medications: No              Were doses missed due to medication being on hold? No    DUPIXENT  PEN 300 mg/2 mL pen injector (dupilumab ) 0 doses of medicine on hand       REFERRAL TO PHARMACIST     Referral to the pharmacist: Not needed      SHIPPING     Shipping address confirmed in Epic.     Cost and Payment: Patient has a $0 copay, payment information is not required.    Delivery Scheduled: Yes, Expected medication delivery date: 08/25/2024.     Medication will be delivered via Same Day Courier to the prescription address in Epic WAM.    Nelida Winfred [facility]  Specialty Technician

## 2024-08-25 MED FILL — DUPIXENT 300 MG/2 ML SUBCUTANEOUS PEN INJECTOR: SUBCUTANEOUS | 30 days supply | Qty: 4 | Fill #3

## 2024-09-16 DIAGNOSIS — L719 Rosacea, unspecified: Principal | ICD-10-CM

## 2024-09-16 MED ORDER — METRONIDAZOLE 0.75 % TOPICAL CREAM
0 refills | 0.00000 days
Start: 2024-09-16 — End: ?

## 2024-09-18 MED ORDER — METRONIDAZOLE 0.75 % TOPICAL CREAM
TOPICAL | 0 refills | 0.00000 days | Status: CP
Start: 2024-09-18 — End: ?

## 2024-09-20 NOTE — Unmapped (Signed)
 [facility] Refill Coordination Note    Specialty Medication(s) to be Shipped:   Inflammatory Disorders: Dupixent     Other medication(s) to be shipped: No additional medications requested for fill at this time    Specialty Medications not needed at this time: N/A     Samantha Miles, DOB: [birth year]  Phone: [phone] (home)       All above HIPAA information was verified with patient.     Was a Nurse, learning disability used for this call? No    Completed refill call assessment today to schedule patient's medication shipment from the [facility]  [phone]).  All relevant notes have been reviewed.     Specialty medication(s) and dose(s) confirmed: Regimen is correct and unchanged.   Changes to medications: Pattijo reports no changes at this time.  Changes to insurance: No  New side effects reported not previously addressed with a pharmacist or physician: None reported  Questions for the pharmacist: No    Confirmed patient received a Conservation officer, historic buildings and a Surveyor, mining with first shipment. The patient will receive a drug information handout for each medication shipped and additional FDA Medication Guides as required.       DISEASE/MEDICATION-SPECIFIC INFORMATION        For patients on injectable medications: Next injection is scheduled for 09/26/2024.    SPECIALTY MEDICATION ADHERENCE     Medication Adherence    Patient reported X missed doses in the last month: 0  Specialty Medication: DUPIXENT  PEN 300 mg/2 mL pen injector (dupilumab )  Patient is on additional specialty medications: No              Were doses missed due to medication being on hold? No     DUPIXENT  PEN 300 mg/2 mL pen injector (dupilumab ): 1 doses of medicine on hand       REFERRAL TO PHARMACIST     Referral to the pharmacist: Not needed      [facility]     Shipping address confirmed in Epic.     Cost and Payment: Patient has a $0 copay, payment information is not required.    Delivery Scheduled: Yes, Expected medication delivery date: 09/28/2024.     Medication will be delivered via Same Day Courier to the prescription address in Epic WAM.    Samantha Miles   [facility]  Specialty Technician

## 2024-09-28 MED FILL — DUPIXENT 300 MG/2 ML SUBCUTANEOUS PEN INJECTOR: SUBCUTANEOUS | 30 days supply | Qty: 4 | Fill #4

## 2024-10-11 LAB — COMPREHENSIVE METABOLIC PANEL
ALBUMIN: 4 g/dL (ref 3.4–5.0)
ALKALINE PHOSPHATASE: 119 U/L — ABNORMAL HIGH (ref 46–116)
ALT (SGPT): 106 U/L — ABNORMAL HIGH (ref 10–49)
ANION GAP: 13 mmol/L (ref 5–14)
AST (SGOT): 259 U/L — ABNORMAL HIGH (ref <=34)
BILIRUBIN TOTAL: 2.3 mg/dL — ABNORMAL HIGH (ref 0.3–1.2)
BLOOD UREA NITROGEN: 11 mg/dL (ref 9–23)
BUN / CREAT RATIO: 12
CALCIUM: 9.9 mg/dL (ref 8.7–10.4)
CHLORIDE: 99 mmol/L (ref 98–107)
CO2: 29.7 mmol/L (ref 20.0–31.0)
CREATININE: 0.89 mg/dL (ref 0.55–1.02)
EGFR CKD-EPI (2021) FEMALE: 86 mL/min/1.73m2 (ref >=60)
GLUCOSE RANDOM: 129 mg/dL (ref 70–179)
POTASSIUM: 4.1 mmol/L (ref 3.4–4.8)
PROTEIN TOTAL: 7.2 g/dL (ref 5.7–8.2)
SODIUM: 142 mmol/L (ref 135–145)

## 2024-10-11 LAB — URINALYSIS WITH MICROSCOPY WITH CULTURE REFLEX PERFORMABLE
BILIRUBIN UA: NEGATIVE
BLOOD UA: NEGATIVE
GLUCOSE UA: NEGATIVE
LEUKOCYTE ESTERASE UA: NEGATIVE
NITRITE UA: NEGATIVE
PH UA: 8 (ref 5.0–9.0)
PROTEIN UA: NEGATIVE
RBC UA: <1 /HPF (ref <=4)
SPECIFIC GRAVITY UA: 1.014 (ref 1.003–1.030)
SQUAMOUS EPITHELIAL: <1 /HPF (ref 0–5)
UROBILINOGEN UA: 3 — AB
WBC UA: <1 /HPF (ref 0–5)

## 2024-10-11 LAB — CBC W/ AUTO DIFF
BASOPHILS ABSOLUTE COUNT: 0.1 10*9/L (ref 0.0–0.1)
BASOPHILS RELATIVE PERCENT: 0.4 %
EOSINOPHILS ABSOLUTE COUNT: 0.1 10*9/L (ref 0.0–0.5)
EOSINOPHILS RELATIVE PERCENT: 0.5 %
HEMATOCRIT: 44.6 % — ABNORMAL HIGH (ref 34.0–44.0)
HEMOGLOBIN: 15.3 g/dL — ABNORMAL HIGH (ref 11.3–14.9)
LYMPHOCYTES ABSOLUTE COUNT: 1.9 10*9/L (ref 1.1–3.6)
LYMPHOCYTES RELATIVE PERCENT: 13.5 %
MEAN CORPUSCULAR HEMOGLOBIN CONC: 34.3 g/dL (ref 32.0–36.0)
MEAN CORPUSCULAR HEMOGLOBIN: 30.4 pg (ref 25.9–32.4)
MEAN CORPUSCULAR VOLUME: 88.6 fL (ref 77.6–95.7)
MEAN PLATELET VOLUME: 7.1 fL (ref 6.8–10.7)
MONOCYTES ABSOLUTE COUNT: 0.8 10*9/L (ref 0.3–0.8)
MONOCYTES RELATIVE PERCENT: 5.7 %
NEUTROPHILS ABSOLUTE COUNT: 11.4 10*9/L — ABNORMAL HIGH (ref 1.8–7.8)
NEUTROPHILS RELATIVE PERCENT: 79.9 %
NUCLEATED RED BLOOD CELLS: 0 /100{WBCs} (ref <=4)
PLATELET COUNT: 361 10*9/L (ref 150–450)
RED BLOOD CELL COUNT: 5.04 10*12/L (ref 3.95–5.13)
RED CELL DISTRIBUTION WIDTH: 14 % (ref 12.2–15.2)
WBC ADJUSTED: 14.2 10*9/L — ABNORMAL HIGH (ref 3.6–11.2)

## 2024-10-11 LAB — LIPASE: LIPASE: 40 U/L (ref 12–53)

## 2024-10-11 LAB — PREGNANCY, URINE: PREGNANCY TEST URINE: NEGATIVE

## 2024-10-11 NOTE — ED Triage Note (Signed)
 Epigastric to RUQ pain that came on all of a sudden while driving. N/V, -diarrhea.

## 2024-10-12 ENCOUNTER — Ambulatory Visit: Admit: 2024-10-12 | Discharge: 2024-10-13 | Payer: Medicare (Managed Care)

## 2024-10-12 ENCOUNTER — Inpatient Hospital Stay: Admit: 2024-10-12 | Discharge: 2024-10-13 | Payer: Medicare (Managed Care)

## 2024-10-12 ENCOUNTER — Encounter: Admit: 2024-10-12 | Discharge: 2024-10-13 | Payer: Medicare (Managed Care)

## 2024-10-12 DIAGNOSIS — K8050 Calculus of bile duct without cholangitis or cholecystitis without obstruction: Principal | ICD-10-CM

## 2024-10-12 LAB — COMPREHENSIVE METABOLIC PANEL
ALBUMIN: 3.3 g/dL — ABNORMAL LOW (ref 3.4–5.0)
ALKALINE PHOSPHATASE: 119 U/L — ABNORMAL HIGH (ref 46–116)
ALT (SGPT): 214 U/L — ABNORMAL HIGH (ref 10–49)
ANION GAP: 14 mmol/L (ref 5–14)
AST (SGOT): 257 U/L — ABNORMAL HIGH (ref <=34)
BILIRUBIN TOTAL: 1.7 mg/dL — ABNORMAL HIGH (ref 0.3–1.2)
BLOOD UREA NITROGEN: 9 mg/dL (ref 9–23)
BUN / CREAT RATIO: 11
CALCIUM: 9.1 mg/dL (ref 8.7–10.4)
CHLORIDE: 101 mmol/L (ref 98–107)
CO2: 28.2 mmol/L (ref 20.0–31.0)
CREATININE: 0.8 mg/dL (ref 0.55–1.02)
EGFR CKD-EPI (2021) FEMALE: >90 mL/min/1.73m2 (ref >=60)
GLUCOSE RANDOM: 97 mg/dL (ref 70–179)
POTASSIUM: 3.6 mmol/L (ref 3.4–4.8)
PROTEIN TOTAL: 6.3 g/dL (ref 5.7–8.2)
SODIUM: 143 mmol/L (ref 135–145)

## 2024-10-12 LAB — CBC
HEMATOCRIT: 39 % (ref 34.0–44.0)
HEMOGLOBIN: 13.7 g/dL (ref 11.3–14.9)
MEAN CORPUSCULAR HEMOGLOBIN CONC: 35.1 g/dL (ref 32.0–36.0)
MEAN CORPUSCULAR HEMOGLOBIN: 31.3 pg (ref 25.9–32.4)
MEAN CORPUSCULAR VOLUME: 89.3 fL (ref 77.6–95.7)
MEAN PLATELET VOLUME: 7.3 fL (ref 6.8–10.7)
PLATELET COUNT: 345 10*9/L (ref 150–450)
RED BLOOD CELL COUNT: 4.37 10*12/L (ref 3.95–5.13)
RED CELL DISTRIBUTION WIDTH: 13.9 % (ref 12.2–15.2)
WBC ADJUSTED: 9.9 10*9/L (ref 3.6–11.2)

## 2024-10-12 MED ORDER — OXYCODONE 5 MG TABLET
ORAL_TABLET | Freq: Four times a day (QID) | ORAL | 0 refills | 3.00000 days | PRN
Start: 2024-10-12 — End: 2024-10-17

## 2024-10-12 MED ADMIN — sodium chloride irrigation (NS) 0.9 % irrigation solution: @ 19:00:00 | Stop: 2024-10-12

## 2024-10-12 MED ADMIN — ondansetron (ZOFRAN) injection 4 mg: 4 mg | INTRAVENOUS | @ 06:00:00 | Stop: 2024-10-12

## 2024-10-12 MED ADMIN — acetaminophen (TYLENOL) tablet 650 mg: 650 mg | ORAL | @ 13:00:00

## 2024-10-12 MED ADMIN — acetaminophen (OFIRMEV) 10 mg/mL injection: INTRAVENOUS | @ 19:00:00 | Stop: 2024-10-12

## 2024-10-12 MED ADMIN — ondansetron (ZOFRAN) injection 4 mg: 4 mg | INTRAVENOUS | @ 07:00:00 | Stop: 2024-10-12

## 2024-10-12 MED ADMIN — oxyCODONE (ROXICODONE) immediate release tablet 5 mg: 5 mg | ORAL | @ 21:00:00 | Stop: 2024-10-12

## 2024-10-12 MED ADMIN — lidocaine (PF) (XYLOCAINE-MPF) 20 mg/mL (2 %) injection: INTRAVENOUS | @ 18:00:00 | Stop: 2024-10-12

## 2024-10-12 MED ADMIN — Propofol (DIPRIVAN) injection: INTRAVENOUS | @ 18:00:00 | Stop: 2024-10-12

## 2024-10-12 MED ADMIN — HYDROmorphone (PF) (DILAUDID) injection 1 mg: 1 mg | INTRAVENOUS | @ 07:00:00 | Stop: 2024-10-12

## 2024-10-12 MED ADMIN — ondansetron (ZOFRAN) injection 4 mg: 4 mg | INTRAVENOUS | @ 01:00:00 | Stop: 2024-10-11

## 2024-10-12 MED ADMIN — midazolam (VERSED) injection: INTRAVENOUS | @ 18:00:00 | Stop: 2024-10-12

## 2024-10-12 MED ADMIN — fentaNYL (PF) (SUBLIMAZE) injection: INTRAVENOUS | @ 19:00:00 | Stop: 2024-10-12

## 2024-10-12 MED ADMIN — fentaNYL (PF) (SUBLIMAZE) injection: INTRAVENOUS | @ 18:00:00 | Stop: 2024-10-12

## 2024-10-12 MED ADMIN — labetalol (NORMODYNE) injection: INTRAVENOUS | @ 20:00:00 | Stop: 2024-10-12

## 2024-10-12 MED ADMIN — ketorolac (TORADOL) injection: INTRAVENOUS | @ 20:00:00 | Stop: 2024-10-12

## 2024-10-12 MED ADMIN — sugammadex (BRIDION) injection: INTRAVENOUS | @ 20:00:00 | Stop: 2024-10-12

## 2024-10-12 MED ADMIN — piperacillin-tazobactam (ZOSYN) IVPB (premix) 4.5 g: 4.5 g | INTRAVENOUS | @ 17:00:00 | Stop: 2024-10-12

## 2024-10-12 MED ADMIN — succinylcholine (ANECTINE) injection: INTRAVENOUS | @ 18:00:00 | Stop: 2024-10-12

## 2024-10-12 MED ADMIN — HYDROmorphone (PF) (DILAUDID) injection: INTRAVENOUS | @ 20:00:00 | Stop: 2024-10-12

## 2024-10-12 MED ADMIN — ketamine (KETALAR) injection: INTRAVENOUS | @ 19:00:00 | Stop: 2024-10-12

## 2024-10-12 MED ADMIN — ketamine (KETALAR) injection: INTRAVENOUS | @ 18:00:00 | Stop: 2024-10-12

## 2024-10-12 MED ADMIN — ceFAZolin (ANCEF) IVPB 2 g in 50 ml dextrose (premix): 2 g | INTRAVENOUS | @ 18:00:00 | Stop: 2024-10-12

## 2024-10-12 MED ADMIN — bupivacaine-EPINEPHrine (PF) (MARCAINE-PF w/EPI) 0.25 %-1:200,000 injection (PF): @ 19:00:00 | Stop: 2024-10-12

## 2024-10-12 MED ADMIN — menthol (COUGH DROPS) lozenge 1 lozenge: 1 | ORAL | @ 23:00:00

## 2024-10-12 MED ADMIN — dexAMETHasone (DECADRON) 4 mg/mL injection: INTRAVENOUS | @ 18:00:00 | Stop: 2024-10-12

## 2024-10-12 MED ADMIN — lactated Ringers infusion: 10 mL/h | INTRAVENOUS | @ 17:00:00 | Stop: 2024-10-12

## 2024-10-12 MED ADMIN — glucagon injection: INTRAVENOUS | @ 19:00:00 | Stop: 2024-10-12

## 2024-10-12 MED ADMIN — ondansetron (ZOFRAN) injection: INTRAVENOUS | @ 20:00:00 | Stop: 2024-10-12

## 2024-10-12 MED ADMIN — ROCuronium (ZEMURON) injection: INTRAVENOUS | @ 19:00:00 | Stop: 2024-10-12

## 2024-10-12 MED ADMIN — ROCuronium (ZEMURON) injection: INTRAVENOUS | @ 18:00:00 | Stop: 2024-10-12

## 2024-10-12 MED ADMIN — morphine 4 mg/mL injection 4 mg: 4 mg | INTRAVENOUS | @ 06:00:00 | Stop: 2024-10-12

## 2024-10-12 MED ADMIN — heparin (porcine) 5,000 unit/mL injection 5,000 Units: 5000 [IU] | SUBCUTANEOUS | @ 17:00:00 | Stop: 2024-10-12

## 2024-10-12 MED ADMIN — dexmedeTOMIDine (Precedex) 200 mcg in sodium chloride (NS) 0.9 % 50 mL infusion: INTRAVENOUS | @ 19:00:00 | Stop: 2024-10-12

## 2024-10-12 MED ADMIN — piperacillin-tazobactam (ZOSYN) IVPB (premix) 4.5 g: 4.5 g | INTRAVENOUS | @ 07:00:00 | Stop: 2024-10-12

## 2024-10-12 NOTE — Brief Op Note (Signed)
 Brief Operative Note  (CSN: [id number])      Date of Surgery: 10/12/2024    Pre-op Diagnosis: Choledocholithiasis [K80.50]    Post-op Diagnosis: Choledocholithiasis [K80.50]    Procedure(s):  LAPAROSCOPIC CHOLECYSTECTOMY WITH CHOLANGIOGRAPHY: 47563 (CPT??)    Note: Revisions to procedures should be made in chart - see Procedures activity.    Performing Service: General Surgery  Surgeons and Role:     * Tremel Setters, Delon LABOR, MD - Primary     * Awe, Juliene HERO, MD - Resident - Assisting    Assistant: Physician Assistant: Valeri Edsel SAILOR, PA skilled assistant was required to scope, retract, and suture    Findings:   1) Mild inflammation/edema  2) IOC negative for obstruction with rapid flow into duodenum    Anesthesia: General    Estimated Blood Loss: 15mL    Complications: None apparent    Specimens:   ID Type Source Tests Collected by Time Destination   1 : Gallbladder Tissue Gallbladder SURGICAL PATHOLOGY ERASMO Catalina Delon LABOR, MD 10/12/2024 1350        Implants: * No implants in log *      Delon LABOR Catalina, MD   Date: 10/12/2024  Time: 3:05 PM

## 2024-10-12 NOTE — H&P (Signed)
 Family Medicine Inpatient Service History & Physical    Assessment & Plan:   Samantha Miles is a 38 y.o. female whose presentation is complicated by obesity that presented to [facility] Emergency Department with RUQ pain x1 day w/ nausea and vomiting. Labs and history consistent with cholecystitis despite RUQ US  showing cholelithiasis without cholecystitis. Given clinical picture highly suspicious for cholecystitis, pt was admitted for surgical consultation in AM.    Principal Problem:    Acute cholecystitis  Active Problems:    Obesity    Eczema    Partial deafness      Active Problems    #RUQ pain  #Cholecystitis  Epigastric/RUQ pain x1 day w/ N/V. Vitals normal. Labs notable for WBC 14.2, Tbili 2.3, alk phos 119, AST 259, ALT 109. No alcohol. RUQ US  showed cholelithiasis and hepatic steatosis without gb wall thickening or biliary dilatation. UA negative, EKG without ischemic changes. Differential includes cholecystitis vs symptomatic cholelithiasis vs GERD. GLP-1 may contribute to risk. Surgery consulted, given high suspicion of acute cholecystitis, pt admitted w/ plan for surgical eval in AM.   - Zosyn q8h  - Tylenol  prn  - Dilaudid 1mg  q4h prn  - NPO  - Fu surgery recs in AM for MRCP vs surgery      Chronic Problems    #Obesity  Taking phentermine daily and wegovy. Last wegovy shot 11/6 (due today, 11/13). Has lost 50 lbs since starting wegovy.  - hold wegovy   - hold phentermine    #Eczema  Taking dupixen injection once monthly. Last 10/30.    #Partial deafness   Wears hearing aids        Issues Impacting Complexity of Management:      Medical Decision Making : Independently interpreted RUQ US  notable for cholelithiasis.      Checklist:  Diet: NPO and Regular Diet  DVT PPx: Not Indicated - Padua Score <4  Code Status: Full Code  Dispo: Patient appropriate for Observation based on expectation of ongoing need for hospitalization less than two midnights and/or low intensity of services provided    Team Contact Information:   Primary Team: Family Medicine Samantha Miles  Primary Resident: Samantha JONETTA Marcus, MD  Resident's Pager: Samantha Miles [phone])    Chief Concern:   Acute cholecystitis      Subjective:   Samantha Miles is a 38 y.o. female with pertinent PMHx of obesity on GLP-1 presenting with epigastric and RUQ pain.     HPI:  This afternoon pt was feeling stomach discomfort and some heart burn. Took some nausea medicine and pantoprazole at home without improvement. Pain got progressively worse throughout the evening, felt like she couldn't sit still b/c of pain. Pain was epigastric initially but migrated to RUQ. Crampy, constant pain. No appetite, hasn't eaten since pain started. Pain felt similar to heartburn she's had in the past but now feels different and more intense. Endorses nausea, vomited 3-4 times. Denies fever, no diarrhea, no bloody stool. Last BM was 5pm this evening, normal. No history of gallstones. Surgical history includes hysterectomy.     Presently nausea and pain are well controlled with medication in ED.      Pertinent Surgical Hx  Past Surgical History[1]      Pertinent Family Hx  Family History[2]      Pertinent Social Hx   Social History [3]      Allergies  Sulfacetamide sodium, Latex, and Sulfamethoxazole    I reviewed the Medication List. Pt not  taking below medication -- only taking wegovy, phentermine, dupixen, and diclofenac  Prior to Admission medications   Medication Dose, Route, Frequency   betamethasone  dipropionate (DIPROLENE ) 0.05 % ointment betamethasone  dipropionate 0.05 % topical ointment   betamethasone  dipropionate 0.05 % cream Apply twice a day to eczema flare until the skin feels smooth, then stop. Do not apply to the skin on the face, underarms or groin.   betamethasone , augmented, (DIPROLENE ) 0.05 % cream Apply twice daily to areas of eczema until flat and smooth. Avoid face and skin folds   clobetasoL  (TEMOVATE ) 0.05 % ointment Apply to eczema on hands at night until resolved.   dupilumab  (DUPIXENT ) 300 mg/2 mL pen injector Inject the contents of 1 pen (300 mg total) under the skin every twenty-eight (28) days.   ibuprofen  (ADVIL ,MOTRIN ) 800 MG tablet TAKE 1 TABLET BY MOUTH EVERY 8 HOURS AS NEEDED FOR MODERATE PAIN   meloxicam (MOBIC) 15 MG tablet Mobic 15 mg tablet   Take 1 tablet(s) every day by oral route.   metroNIDAZOLE  (METROCREAM ) 0.75 % cream APPLY  CREAM TOPICALLY TWICE DAILY TO  FACE   ondansetron  (ZOFRAN ) 4 MG tablet 4 mg   TRUEPLUS PEN NEEDLE 31 gauge x 1/4 (6 mm) Ndle No dose, route, or frequency recorded.   VICTOZA  2-PAK 0.6 mg/0.1 mL (18 mg/3 mL) injection No dose, route, or frequency recorded.       Designated Chiropodist Maker:  [name] currently has decisional capacity for healthcare decision-making and is able to designate a surrogate healthcare decision maker. [name] designated healthcare decision maker(s) is/are     Samantha Miles, Samantha Miles  Spouse  [phone] [facility] Phone)   as denoted by stated patient preference.    Objective:   Physical Exam:  Temp:  [35.8 ??C (96.5 ??F)] 35.8 ??C (96.5 ??F)  Pulse:  [65] 65  SpO2 Pulse:  [83-89] 83  Resp:  [18] 18  BP: (135-160)/(84-99) 135/84  SpO2:  [97 %-100 %] 97 %    Gen: NAD, converses   Eyes: Sclera anicteric, EOMI grossly normal   HENT: Atraumatic, normocephalic  Neck: Trachea midline  Heart: RRR  Lungs: CTAB, no crackles or wheezes  Abdomen: obese abdomen, soft, non-distended. RUQ tenderness, + murphy sign, mild tenderness to epigastric palpation. No guarding, rebound, rigidity  Extremities: No edema  Neuro: Grossly symmetric, non-focal    Skin:  No rashes, lesions on clothed exam  Psych: Alert, oriented         [1]   Past Surgical History:  Procedure Laterality Date    INNER EAR SURGERY      MIDDLE EAR SURGERY      TUBAL LIGATION     [2]   Family History  Problem Relation Age of Onset    Multiple sclerosis Mother     Melanoma Neg Hx     Basal cell carcinoma Neg Hx     Squamous cell carcinoma Neg Hx    [3] Social History  Socioeconomic History    Marital status: Married   Occupational History    Occupation: Unemployed   Tobacco Use    Smoking status: Never    Smokeless tobacco: Never   Vaping Use    Vaping status: Never Used   Substance and Sexual Activity    Alcohol use: No    Drug use: No    Sexual activity: Yes     Partners: Male     Birth control/protection: Coitus interruptus, None   Other Topics Concern    Exercise Yes  Comment: 30 min daily on treadmill    Living Situation Yes     Comment: Lives with husband, mother in law and two daughters    Do you use sunscreen? No    Tanning bed use? No    Are you easily burned? Yes    Excessive sun exposure? No    Blistering sunburns? No     Social Drivers of Health     Tobacco Use: Low Risk (03/28/2024)    Patient History     Smoking Tobacco Use: Never     Smokeless Tobacco Use: Never   Interpersonal Safety: Not At Risk (10/11/2024)    Interpersonal Safety     Unsafe Where You Currently Live: No     Physically Hurt by Anyone: No     Abused by Anyone: No

## 2024-10-12 NOTE — Hospital Course (Addendum)
#  RUQ pain  #Cholelithiasis  Epigastric/RUQ pain x1 day w/ N/V. Vitals normal. Labs notable for WBC 9.9, Tbili 1.7, alk phos 119, AST 257, ALT 214. No alcohol use. RUQ US  showed cholelithiasis and hepatic steatosis without gb wall thickening or biliary dilatation. UA negative, EKG without ischemic changes. Symptoms, labs, and imaging findings are most consistent with choledocholithiasis. She has lab values consistent with obstruction of the biliary tract in the setting of known gall stones. The differential includes symptomatic cholelithiasis (biliary colic) as she does not have signs of systemic inflammation (leukocytosis/fever) or inflammatory changes on imaging that would be expected in cholecystitis. Surgery consulted, given high suspicion of acute cholecystitis, pt admitted w/ plan for surgical eval. Planning for surgery today (11/13)     ---  38 year old female with a past medical history of depression, obesity, eczema, and HOH who presented to [facility] ED on 10/11/2024 for evaluation of right upper quadrant pain, nausea, and vomiting.    ED workup included right upper quadrant ultrasound which showed cholelithiasis and hepatic steatosis.  Labs notable for leukocytosis (WBC 14.2).  T. bili elevated at 2.3.  LFTs elevated (AST 259, ALT 106, alkaline phosphatase 119).  UA negative.    Her clinical picture and lab findings were concerning for symptomatic cholelithiasis versus acute cholecystitis versus choledocholithiasis. She was started on IV antibiotics.  She was admitted to the medicine team overnight for further treatment.    On hospital day #2, her T. bili slightly improved to 1.7. LFTs were overall stable.  Leukocytosis had resolved.  She underwent laparoscopic cholecystectomy with intraoperative cholangiogram on 10/12/2024 with Dr. Serfin.    Intraoperative findings consistent with mild gallbladder edema and multiple small gallstones. Intraoperative cholangiogram was negative for any filling defect or obstruction.  She tolerated the procedure well. She was extubated without complication and brought to the PACU for monitoring before being transferred to the floor. She was transferred from the medicine service to the general surgery service.     On POD#1, she recovered well from surgery, tolerated a diet, LFTs downtrending.     On the day of discharge 10/13/24, she was tolerating a regular diet, pain was controlled with oral medication, voiding spontaneously, and was ambulating with minimal assistance.     She was discharged in stable condition.    Recommend holding Wegovy in the immediate post-operative period, hold for one week.    Follow-up in 2 weeks in the general surgery clinic, an appointment has been requested.

## 2024-10-12 NOTE — Op Note (Signed)
 OPERATIVE REPORT     ?   PATIENT NAME: Samantha Miles        DOB: Apr 09, 1986  AGE:38 y.o.   MRN: [id number]  ?   DATE OF SURGERY: 10/12/2024   ADMIT DATE: 10/11/2024  PERFORMING SERVICE: General Surgery  ?   PREOPERATIVE DIAGNOSIS: Choledocholithiasis  ?   POSTOPERATIVE DIAGNOSIS: Cholelithiasis    PROCEDURE:   Laparoscopic cholecystectomy with intra-operative cholangiogram    Surgeons and Role:     * Catalina Delon LABOR, MD - Primary     * Awe, Juliene HERO, MD - Resident - Assisting  Physician Assistant: Valeri Edsel SAILOR, PA      ANESTHESIOLOGIST: No responsible provider has been recorded for the case.    ANESTHESIA: General Endotracheal Anesthesia    INTRAVENOUS FLUIDS:  ?   ESTIMATED BLOOD LOSS: 15mL  ?   DRAINS: None    IMPLANTS:  * No implants in log *  ?   SPECIMENS:   ID Type Source Tests Collected by Time Destination   1 : Gallbladder Tissue Gallbladder SURGICAL PATHOLOGY EXAM Catalina Delon LABOR, MD 10/12/2024 1350      COMPLICATIONS: None apparent    OPERATIVE FINDINGS:   Mild gallbladder edema  Multiple small gallstones  Intra-operative cholangiogram negative for filling defect/obstruction    OPERATIVE INDICATIONS: Samantha Miles is a 38 y.o. female who presented to the ER with findings consistent with choledocholithiasis. The risks, benefits, and outcomes of the procedure were discussed with her in detail. She understood and wished to proceed.   ?   DESCRIPTION OF PROCEDURE: The patient was correctly identified before the procedure and informed consent was reviewed. The patient was taken to the operating room and placed on the operating table in supine position.  An initial timeout was performed with members of the surgical and anesthesia teams present.  General endotracheal anesthesia was induced prior to beginning of the procedure.    After injection of 0.25% Marcaine with epi, 5mm incision was made superior to the umbilicus. 5mm port was placed under direct visualization using OptiView technique. Once the abdominal cavity was entered, insufflation was started. After adequate pneumoperitoneum, the abdominal cavity was inspected and atraumatic entry was confirmed.    Additional 12mm port was placed into the subxiphoid area, and two 5mm ports were placed in the right upper quadrant. Camera was then placed into the subxiphoid area and the peri-umbilical port was repositioned to reduce torque on the abdominal wall.  The gallbladder was noted to ne mildly distended, in expected location and distended. We used an aspiration needle to remove some bile from the gallbladder to facilitate grasping and retraction. The gallbladder dome was grasped and retracted cephalad. The infundibulum was retracted laterally. The fatty tissue at the infundibulum was opened and dissected with a combination of Bovie cautery and blunt dissection. The Cystic duct and artery were identified and clearly dissected. After critical view of safety was identified, the cystic artery was transected. Next, clip was placed on the specimen side of the cystic duct. Small ductotomy was made, cholangiocatheter was passed and cholangiogram was completed. Cholangiogram was negative for intraluminal stones or obstruction.    Next, the cystic duct was doubly clipped on the stay side and gallbladder was dissected free from the liver using Bovie cautery. During dissection a small amount of bleeding was encountered that was controlled with Bovie cautery. The gallbladder was placed into a specimen bag and removed from the abdomen through the 12mm port site. The  abdomen was copiously irrigated with sterile saline solution. The liver bed was coated in Flow-Seal to confirm hemostasis.    12mm port site was closed at the fascia level with 0 Vicryl suture. All skin was closed with 4-0 monocryl suture and covered with Dermabond skin glue.    Instrument, sponge, and needle counts were correct at the conclusion of the case.     CONDITION & DISPOSITION: PACU - extubated and hemodynamically stable    POST-OP PLANS & INSTRUCTIONS:  general surgery floor    Due to the complexity of this case, Edsel Oak, PA-C was present to assist with the scope, retraction, and closure. The following exceptional medical circumstances contributed to the complexity of this case, laparoscopy.    I was present and scrubbed for the entire case.    Delon Lesser, MD  General and Acute Care Surgery

## 2024-10-12 NOTE — Progress Notes (Signed)
 Family Medicine Inpatient Progress Note    Assessment & Plan:   Samantha Miles is a 38 y.o. female whose presentation is complicated by obesity that presented to [facility] Emergency Department with RUQ pain x1 day w/ nausea and vomiting. Labs and history consistent with cholecystitis despite RUQ US  showing cholelithiasis without cholecystitis. Given clinical picture highly suspicious for cholecystitis vs choledocholithiasis, pt requires continued admission for surgical evaluation.     Principal Problem:    Acute cholecystitis  Active Problems:    Obesity    Eczema    Partial deafness    Choledocholithiasis      Active Problems    #RUQ pain  #Cholelithiasis  Epigastric/RUQ pain x1 day w/ N/V. Vitals normal. Labs notable for WBC 9.9, Tbili 1.7, alk phos 119, AST 257, ALT 214. No alcohol use. RUQ US  showed cholelithiasis and hepatic steatosis without gb wall thickening or biliary dilatation. UA negative, EKG without ischemic changes. Symptoms, labs, and imaging findings are most consistent with choledocholithiasis. The differential includes symptomatic cholelithiasis (biliary colic) as she does not have signs of systemic inflammation (leukocytosis/fever) or inflammatory changes on imaging that would be expected in cholecystitis. She has lab values consistent with obstruction of the biliary tract in the setting of known gall stones. Of note, her BMI and GLP-1 may contribute to risk of developing gallstones. Surgery consulted, given high suspicion of acute cholecystitis, pt admitted w/ plan for surgical eval. Planning for surgery today (11/13)   - Zosyn q8h  - Tylenol  prn  - Dilaudid 1mg  q4h prn  - NPO  - Fu post surgery     Chronic Problems     #Obesity  Taking phentermine daily and wegovy. Last wegovy shot 11/6 (due today, 11/13). Has lost 50 lbs since starting wegovy.  - hold wegovy   - hold phentermine     #Eczema  Taking dupixen injection once monthly. Last 10/30.     #Partial deafness   Wears hearing aids, husband will bring to bedside.       Daily Checklist:  Diet: NPO and Regular Diet  DVT PPx: Not Indicated - Padua Score <4  Electrolytes: No Repletion Needed  Code Status: Full Code  Dispo: Goal Discharge: home after surgical evaluation    Team Contact Information:   Primary Team: Family Medicine Blue  Primary Resident:   Resident's Pager: Fam Med Blue [phone])    Interval History:   No acute events overnight. Patient reports 7/10 RUQ pain improved from 10/10 last night. Has not asked for her PRN meds, instructed pt to ask nurse when she is in pain. Nausea is improving.     ROS: Denies headache, chest pain, or continued n/v this AM    Objective:   Temp:  [35.8 ??C (96.5 ??F)-36.5 ??C (97.7 ??F)] 36.5 ??C (97.7 ??F)  Pulse:  [65-80] 80  SpO2 Pulse:  [83-89] 83  Resp:  [18] 18  BP: (135-160)/(84-99) 137/93  SpO2:  [97 %-100 %] 98 %    Gen: NAD, converses   HENT: atraumatic, normocephalic  Heart: RRR  Lungs: CTAB, no crackles or wheezes  Abdomen: no scarring or bruising, soft and non-distended, tender to palpation in RUQ and epigastric region  Extremities: No edema    Vermell Schuller, MD   [facility] Family Med PGY-1

## 2024-10-12 NOTE — ED Provider Notes (Signed)
 [facility]  Emergency Department Provider Note     ED Clinical Impression     Final diagnoses:   Right upper quadrant abdominal pain (Primary)   Calculus of gallbladder without cholecystitis without obstruction      Impression, Medical Decision Making, ED Course     Impression: 38 y.o. female with PMH most significant for eczema who presents with 1 day of lower abdominal pain, nausea and vomiting as described below.     DDx/MDM:   RUQ abdominal pain, consistent with acute cholecystitis. Abdominal exam without peritoneal signs; no evidence of acute abdomen at this time. Well appearing. Given RUQ US  findings patient likely has biliary colic versus acute cholecystitis or cholangitis, dissipate admission with surgery consult.  Patient given Zosyn, surgery consulted and patient to be admitted. Less likely to represent acute pancreatitis (neg lipase), PUD (including gastric perforation), acute infectious processes (pneumonia, hepatitis, pyelonephritis), atypical appendicitis, vascular catastrophe, bowel obstruction or viscus perforation, or acute coronary syndrome. Presentation not consistent with other acute, emergent causes of abdominal pain at this time.       Diagnostic workup as below. Will treat patient with morphine  for pain, Zosyn for cholecystitis and Zofran  for nausea.    Orders Placed This Encounter   Procedures    US  RUQ W Gallbladder    CBC w/ Differential    Comprehensive Metabolic Panel    Lipase Level    Urinalysis with Microscopy with Culture Reflex    Pregnancy Qualitative, Urine    ECG 12 Lead    ED Admit Decision       ED Course as of 10/12/24 0324   Thu Oct 12, 2024   0143 On shows cholelithiasis and hepatic steatosis no gallbladder wall thickening or signs of acute cholecystitis.     [id number] Surgery paged   0207 Spoke with Dr. Marchelle with surgery and recommends admitting patient to medicine and will be on surgery service and they will assess in the morning with possible further imaging/cholecystectomy       MDM Elements  Discussion of Management with other Physicians, QHP or Appropriate Source: Admitting team - hospitalist, surgery consulted  Independent Interpretation of Studies: Ultrasound(s) - see above      Social Determinants that significantly affected care: None  Prescription drugs considered but not prescribed: na  Diagnostic tests considered but not performed: na  ____________________________________________    The case was discussed with the attending physician, who is in agreement with the above assessment and plan.      History     Chief Complaint  Chief Complaint   Patient presents with    Abdominal Pain       HPI   History of Present Illness  Trenyce Kinda Pottle is a 38 year old female who presents with abdominal pain and nausea. She is accompanied by her husband, Elsie.    Symptoms began suddenly last weekend while picking up her children from school. She experienced a tight sensation in her stomach followed by severe cramping. Initially thought to be heartburn, she took pantoprazole without relief. She vomited two to three times without any blood or bright green color in the vomit.    She continues to experience nausea and abdominal pain, described as constant. The pain radiates to her back, particularly around the shoulder blade area, and worsens with deep breaths. She also experienced diarrhea earlier in the day, coinciding with attempts to vomit.    Her past medical history includes a hysterectomy in 2015 and a recent  surgery on her leg in March. She denies any daily medication use except for weight loss medication. No history of recent fevers, sore throat, chest pain, shortness of breath, constipation, or urinary issues.    She notes that certain foods, particularly greasy or fatty foods, seem to exacerbate her symptoms.    Outside Historian(s): I have obtained additional history/collateral from husband bedside.    Past Medical History[1]    Past Surgical History[2]    Active Medications[3]     Allergies[4]    Family History[5]    Short Social History[6]     Physical Exam     VITAL SIGNS:      Vitals:    10/11/24 1954 10/11/24 2334   BP: 160/92 156/99   Pulse: 65    Resp: 18    Temp: 35.8 ??C (96.5 ??F)    TempSrc: Temporal    SpO2: 100% 99%   Weight: (!) 110.2 kg (243 lb)        Physical Exam  GENERAL: Alert, cooperative, well developed, no acute distress.  HEENT: Normocephalic, normal oropharynx, moist mucous membranes.  CHEST: Clear to auscultation bilaterally, no wheezes, rhonchi, or crackles.  CARDIOVASCULAR: Regular rate and rhythm, S1 and S2 normal without murmurs, rubs, or gallops.  ABDOMEN: Soft, nondistended, tender in epigastric and right upper quadrant, positive Murphy's sign, no rebound, no guarding.  EXTREMITIES: Good distal pulses bilaterally, no cyanosis, no edema, no erythema.  NEUROLOGICAL: Cranial nerves grossly intact, moves all extremities without gross motor or sensory deficit.       Radiology     US  RUQ W Gallbladder   Final Result      1. Cholelithiasis.   2. Hepatic steatosis.             Pertinent labs & imaging results that were available during my care of the patient were independently interpreted by me and considered in my medical decision making (see chart for details).    Portions of this record have been created using Scientist, clinical (histocompatibility and immunogenetics). Dictation errors have been sought, but may not have been identified and corrected.         [1]   Past Medical History:  Diagnosis Date    Depression     Eczema    [2]   Past Surgical History:  Procedure Laterality Date    INNER EAR SURGERY      MIDDLE EAR SURGERY      TUBAL LIGATION     [3]   Current Facility-Administered Medications   Medication Dose Route Frequency Provider Last Rate Last Admin    piperacillin-tazobactam (ZOSYN) IVPB (premix) 4.5 g  4.5 g Intravenous Q8H SCH Ia Leeb, MD   Stopped at 10/12/24 0309     Current Outpatient Medications   Medication Sig Dispense Refill betamethasone  dipropionate (DIPROLENE ) 0.05 % ointment betamethasone  dipropionate 0.05 % topical ointment      betamethasone  dipropionate 0.05 % cream Apply twice a day to eczema flare until the skin feels smooth, then stop. Do not apply to the skin on the face, underarms or groin. 30 g 3    betamethasone , augmented, (DIPROLENE ) 0.05 % cream Apply twice daily to areas of eczema until flat and smooth. Avoid face and skin folds 50 g 5    clobetasoL  (TEMOVATE ) 0.05 % ointment Apply to eczema on hands at night until resolved. 60 g 5    dupilumab  (DUPIXENT ) 300 mg/2 mL pen injector Inject the contents of 1 pen (300 mg total) under the skin every  twenty-eight (28) days. 4 mL 11    ibuprofen  (ADVIL ,MOTRIN ) 800 MG tablet TAKE 1 TABLET BY MOUTH EVERY 8 HOURS AS NEEDED FOR MODERATE PAIN  0    meloxicam (MOBIC) 15 MG tablet Mobic 15 mg tablet   Take 1 tablet(s) every day by oral route.      metroNIDAZOLE  (METROCREAM ) 0.75 % cream APPLY  CREAM TOPICALLY TWICE DAILY TO  FACE 60 g 0    ondansetron  (ZOFRAN ) 4 MG tablet Take 1 tablet (4 mg total) by mouth.      TRUEPLUS PEN NEEDLE 31 gauge x 1/4 (6 mm) Ndle       VICTOZA  2-PAK 0.6 mg/0.1 mL (18 mg/3 mL) injection      [4]   Allergies  Allergen Reactions    Sulfacetamide Sodium Hives and Rash    Latex Rash     gloves    Sulfamethoxazole Rash   [5]   Family History  Problem Relation Age of Onset    Multiple sclerosis Mother     Melanoma Neg Hx     Basal cell carcinoma Neg Hx     Squamous cell carcinoma Neg Hx    [6]   Social History  Tobacco Use    Smoking status: Never    Smokeless tobacco: Never   Vaping Use    Vaping status: Never Used   Substance Use Topics    Alcohol use: No    Drug use: No        Thomos Brooklyn, MD  Resident  10/12/24 [phone]

## 2024-10-12 NOTE — Consults (Signed)
 SURGERY CONSULT NOTE:   October 12, 2024    Impression:  Patient is a 38 y.o. female with choledocholithiasis based on lab values.    Recommendations:   - NPO  - IVFs  - OR today for laparoscopic cholecystectomy with IOC and possible CBD exploration    History of Present Illness:   Reason for Consult:  Choledocholithiasis    Henri reports that her pain started yesterday, she notes that there is pain, nausea, and heart burn. She was noted to have a distended gallbladder and stones with concern for CBD stone. She denies and recent changes to stools but does note darkened urine.    She has not had pain like this before and denies other gastrointestinal complaints.    Allergies:  Allergies[1]    Medications:  Current Rx[2]     Past Medical History:  Past Medical History[3]    Past Surgical History:  Past Surgical History[4]    Family History:  Family History[5]    Social History:  Short Social History[6]    Review of Systems  10 systems were reviewed and are negative except as noted specifically in the HPI.    Objective:   BP 154/96  - Pulse 73  - Temp 36.5 ??C (97.7 ??F) (Temporal)  - Resp 12  - Ht 160 cm (5' 3)  - Wt (!) 110.2 kg (243 lb)  - LMP 09/01/2021  - SpO2 96%  - BMI 43.05 kg/m??     Intake/Output last 3 shifts:  No intake/output data recorded.    Physical Exam  General:  No acute distress, well appearing and well nourished.   Head:  Normocephalic, atraumatic.   Eyes:  Conjuctiva and lids appear normal. Pupils equal and round, sclera anicteric.   Ears:  Overall appearance normal with no scars, lesions or masses. Hearing is grossly normal.   Nose: Nares grossly normal, no drainage.   Throat: Lips, mucosa, and tongue normal; teeth and gums normal.   Neck: Supple, symmetrical, trachea midline   Pulmonary:    Normal respiratory effort.  Lungs were clear to auscultation bilaterally.   Cardiovascular:  Regular rate and rhythm, no murmur noted.    Abdomen:   Tenderness to palpation in RUQ/epigastrium Musculoskeletal:  Extremities without clubbing, cyanosis, or edema.   Skin: Skin color, texture, turgor normal, no rashes or lesions.   Neurologic: No motor abnormalities noted.  Sensation grossly intact.   Psychiatric: Judgement and insight appropriate. Oriented to person, place, and time.     Most Recent Labs:  Lab Results   Component Value Date    WBC 9.9 10/12/2024    HGB 13.7 10/12/2024    HCT 39.0 10/12/2024    PLT 345 10/12/2024       Lab Results   Component Value Date    NA 143 10/12/2024    K 3.6 10/12/2024    CL 101 10/12/2024    CO2 28.2 10/12/2024    BUN 9 10/12/2024    CREATININE 0.80 10/12/2024    CALCIUM 9.1 10/12/2024       IMAGING:  ECG 12 Lead  Result Date: 10/12/2024  NORMAL SINUS RHYTHM WITH SINUS ARRHYTHMIA CANNOT RULE OUT ANTERIOR INFARCT  , AGE UNDETERMINED ABNORMAL ECG WHEN COMPARED WITH ECG OF 09-Dec-2022 10:39, MINIMAL CRITERIA FOR ANTERIOR INFARCT  ARE NOW PRESENT    US  RUQ W Gallbladder  Result Date: 10/12/2024  EXAM: US  RUQ W GALLBLADDER ACCESSION: [id number] UN REPORT DATE: 10/12/2024 12:10 AM CLINICAL INDICATION: 38 years old with RUQ  tendereness elevated LFTs  COMPARISON: CT abdomen pelvis 09/09/2021 TECHNIQUE: Static and cine images of the right upper quadrant were performed. FINDINGS: LIVER: Increased echogenicity of the liver parenchyma. No focal hepatic lesions. No biliary ductal dilatation.      Liver: 16.5 cm      Common bile duct: .33 cm GALLBLADDER: Gallstones in the gallbladder. Sonographic Murphy sign was negative.  No pericholecystic fluid. No gallbladder wall thickening.      Gallbladder wall: 0.23 cm LIMITED RIGHT KIDNEY: No hydronephrosis.     1. Cholelithiasis. 2. Hepatic steatosis.     Delon Lesser, MD  General and Acute Care Surgery        [1]   Allergies  Allergen Reactions    Sulfacetamide Sodium Hives and Rash    Latex Rash     gloves    Sulfamethoxazole Rash   [2]   Current Facility-Administered Medications   Medication Dose Route Frequency Provider Last Rate Last Admin    acetaminophen  (TYLENOL ) tablet 650 mg  650 mg Oral Q4H PRN Sherman, Jacob D, MD   650 mg at 10/12/24 0757    fentaNYL (PF) (SUBLIMAZE) injection 25 mcg  25 mcg Intravenous Q5 Min PRN Dignan, George William, DO        HYDROmorphone (DILAUDID) injection Syrg 0.2 mg  0.2 mg Intravenous Q5 Min PRN Dignan, George William, DO        HYDROmorphone (PF) (DILAUDID) injection 1 mg  1 mg Intravenous Q4H PRN Sherman, Jacob D, MD        lactated Ringers  infusion  10 mL/hr Intravenous Continuous Isadora Krabbe [place], [place] 10 mL/hr at 10/12/24 1249 Restarted at 10/12/24 1250    ondansetron  (ZOFRAN ) injection 4 mg  4 mg Intravenous Q8H PRN Sherman, Jacob D, MD        Or    ondansetron  (ZOFRAN ) injection 8 mg  8 mg Intravenous Q8H PRN Sherman, Jacob D, MD        oxyCODONE  (ROXICODONE ) immediate release tablet 5 mg  5 mg Oral Once PRN Lenda Zachary Fallow, DO   5 mg at 10/12/24 1537    piperacillin-tazobactam (ZOSYN) IVPB (premix) 4.5 g  4.5 g Intravenous Q8H SCH Sherman, Jacob D, MD 200 mL/hr at 10/12/24 1137 4.5 g at 10/12/24 1137   [3]   Past Medical History:  Diagnosis Date    Depression     Eczema    [4]   Past Surgical History:  Procedure Laterality Date    INNER EAR SURGERY      MIDDLE EAR SURGERY      TUBAL LIGATION     [5]   Family History  Problem Relation Age of Onset    Multiple sclerosis Mother     Melanoma Neg Hx     Basal cell carcinoma Neg Hx     Squamous cell carcinoma Neg Hx    [6]   Social History  Tobacco Use    Smoking status: Never    Smokeless tobacco: Never   Vaping Use    Vaping status: Never Used   Substance Use Topics    Alcohol use: No    Drug use: No

## 2024-10-12 NOTE — Plan of Care (Signed)
 Shift Summary  Pain increased slightly during the shift, requiring multiple interventions including acetaminophen , HYDROmorphone, and oxyCODONE .   Surgical abdominal port site wounds remained closed and stable with topical skin adhesive and open to air dressings.   Anti-embolism devices were consistently in use and safety interventions were maintained.   Family was present and updated, and discharge arrangements were confirmed.   Patient remained alert and able to move all extremities at discharge.     Absence of Hospital-Acquired Illness or Injury: Skin and tissue health maintained with frequent weight shifts, absorbent pads, and specialty bed; anti-embolism devices remained in use and safety interventions were consistently applied throughout the shift. Surgical abdominal port site wounds remained closed with topical skin adhesive and open to air dressings, with attached and defined edges noted; Braden Scale score improved from 19 to 21.     Optimal Comfort and Wellbeing: Pain remained acute and increased slightly from 7 to 8 on the 0-10 scale despite multiple pain interventions including acetaminophen , HYDROmorphone, and oxyCODONE ; pain described as stabbing and constant. Patient was able to sleep after pain intervention earlier in the shift.     Readiness for Transition of Care: No discharge needs identified and ride/caregiver arrangements were confirmed; patient demonstrated voluntary movement of all extremities and was alert and calm at discharge.     Rounds/Family Conference: Family was updated and present at bedside during the shift.

## 2024-10-12 NOTE — Plan of Care (Signed)
 Shift Summary  Severe pain was managed with both non-pharmacologic and pharmacologic interventions, resulting in a substantial reduction in pain score by the end of the shift.  Safety and fall prevention measures were maintained, with no new injuries or complications documented.  Remained independent with mobility and ADLs, and has strong family and home support documented for discharge planning.  No new skin integrity or behavioral health concerns arose during the shift.  Overall, pain control improved and safety was maintained throughout the shift.    Absence of Hospital-Acquired Illness or Injury: No new skin or safety concerns were documented, and Braden score remained high with independent mobility and appropriate safety interventions in place throughout the shift.    Optimal Comfort and Wellbeing: Pain was initially severe but decreased significantly by end of shift after multiple interventions including therapeutic presence, rest, repositioning, and administration of morphine  and HYDROmorphone; pain scores trended from 10 to 2.    Readiness for Transition of Care: Lives with spouse and has family support, resides in a private home, and is independent with mobility and ADLs; no home care services were in place prior to admission.

## 2024-10-13 LAB — COMPREHENSIVE METABOLIC PANEL
ALBUMIN: 3.9 g/dL (ref 3.4–5.0)
ALKALINE PHOSPHATASE: 137 U/L — ABNORMAL HIGH (ref 46–116)
ALT (SGPT): 193 U/L — ABNORMAL HIGH (ref 10–49)
ANION GAP: 16 mmol/L — ABNORMAL HIGH (ref 5–14)
AST (SGOT): 114 U/L — ABNORMAL HIGH (ref <=34)
BILIRUBIN TOTAL: 1 mg/dL (ref 0.3–1.2)
BLOOD UREA NITROGEN: 7 mg/dL — ABNORMAL LOW (ref 9–23)
BUN / CREAT RATIO: 10
CALCIUM: 10 mg/dL (ref 8.7–10.4)
CHLORIDE: 99 mmol/L (ref 98–107)
CO2: 24.5 mmol/L (ref 20.0–31.0)
CREATININE: 0.72 mg/dL (ref 0.55–1.02)
EGFR CKD-EPI (2021) FEMALE: >90 mL/min/1.73m2 (ref >=60)
GLUCOSE RANDOM: 104 mg/dL (ref 70–179)
POTASSIUM: 3.9 mmol/L (ref 3.4–4.8)
PROTEIN TOTAL: 7.5 g/dL (ref 5.7–8.2)
SODIUM: 139 mmol/L (ref 135–145)

## 2024-10-13 LAB — CBC
HEMATOCRIT: 44.4 % — ABNORMAL HIGH (ref 34.0–44.0)
HEMOGLOBIN: 15.1 g/dL — ABNORMAL HIGH (ref 11.3–14.9)
MEAN CORPUSCULAR HEMOGLOBIN CONC: 33.9 g/dL (ref 32.0–36.0)
MEAN CORPUSCULAR HEMOGLOBIN: 30.3 pg (ref 25.9–32.4)
MEAN CORPUSCULAR VOLUME: 89.5 fL (ref 77.6–95.7)
MEAN PLATELET VOLUME: 7.6 fL (ref 6.8–10.7)
PLATELET COUNT: 361 10*9/L (ref 150–450)
RED BLOOD CELL COUNT: 4.96 10*12/L (ref 3.95–5.13)
RED CELL DISTRIBUTION WIDTH: 14.2 % (ref 12.2–15.2)
WBC ADJUSTED: 15.8 10*9/L — ABNORMAL HIGH (ref 3.6–11.2)

## 2024-10-13 MED ORDER — OXYCODONE 5 MG TABLET
ORAL_TABLET | Freq: Four times a day (QID) | ORAL | 0 refills | 3.00000 days | Status: CP | PRN
Start: 2024-10-13 — End: 2024-10-13
  Filled 2024-10-13: qty 10, 3d supply, fill #0

## 2024-10-13 MED ORDER — ONDANSETRON 4 MG DISINTEGRATING TABLET
ORAL_TABLET | Freq: Three times a day (TID) | 0 refills | 4.00000 days | Status: CP | PRN
Start: 2024-10-13 — End: ?
  Filled 2024-10-13: qty 10, 4d supply, fill #0

## 2024-10-13 MED ADMIN — acetaminophen (TYLENOL) tablet 650 mg: 650 mg | ORAL | @ 06:00:00 | Stop: 2024-10-13

## 2024-10-13 MED ADMIN — acetaminophen (TYLENOL) tablet 650 mg: 650 mg | ORAL

## 2024-10-13 MED ADMIN — ondansetron (ZOFRAN) injection 4 mg: 4 mg | INTRAVENOUS | @ 01:00:00

## 2024-10-13 MED ADMIN — HYDROmorphone (DILAUDID) injection Syrg 0.5 mg: .5 mg | INTRAVENOUS | @ 01:00:00 | Stop: 2024-10-15

## 2024-10-13 MED ADMIN — enoxaparin (LOVENOX) syringe 40 mg: 40 mg | SUBCUTANEOUS | @ 13:00:00 | Stop: 2024-10-13

## 2024-10-13 MED ADMIN — ondansetron (ZOFRAN) injection 8 mg: 8 mg | INTRAVENOUS | @ 13:00:00 | Stop: 2024-10-13

## 2024-10-13 NOTE — Plan of Care (Signed)
 Problem: Adult Inpatient Plan of Care  Goal: Absence of Hospital-Acquired Illness or Injury  10/13/2024 1056 by Lafe Elouise SAUNDERS, RN  Outcome: Discharged to Home  10/13/2024 0940 by Lafe Elouise SAUNDERS, RN  Outcome: Shift Focus  Intervention: Identify and Manage Fall Risk  Recent Flowsheet Documentation  Taken 10/13/2024 1000 by Lafe Elouise SAUNDERS, RN  Safety Interventions:   fall reduction program maintained   lighting adjusted for tasks/safety   low bed   nonskid shoes/slippers when out of bed   room near unit station  Taken 10/13/2024 0800 by Lafe Elouise SAUNDERS, RN  Safety Interventions:   fall reduction program maintained   lighting adjusted for tasks/safety   low bed   nonskid shoes/slippers when out of bed   room near unit station  Intervention: Prevent Skin Injury  Recent Flowsheet Documentation  Taken 10/13/2024 1000 by Lafe Elouise SAUNDERS, RN  Device Skin Pressure Protection: absorbent pad utilized/changed  Skin Protection: incontinence pads utilized  Taken 10/13/2024 0800 by Lafe Elouise SAUNDERS, RN  Device Skin Pressure Protection: absorbent pad utilized/changed  Skin Protection: incontinence pads utilized  Goal: Optimal Comfort and Wellbeing  10/13/2024 1056 by Lafe Elouise SAUNDERS, RN  Outcome: Discharged to Home  10/13/2024 0940 by Lafe Elouise SAUNDERS, RN  Outcome: Shift Focus  Goal: Readiness for Transition of Care  10/13/2024 1056 by Lafe Elouise SAUNDERS, RN  Outcome: Discharged to Home  10/13/2024 0940 by Lafe Elouise SAUNDERS, RN  Outcome: Shift Focus  Goal: Rounds/Family Conference  Outcome: Discharged to Home     Problem: Latex Allergy  Goal: Absence of Allergy Symptoms  10/13/2024 1056 by Lafe Elouise SAUNDERS, RN  Outcome: Discharged to Home  10/13/2024 0940 by Lafe Elouise SAUNDERS, RN  Outcome: Shift Focus     Problem: Wound  Goal: Optimal Coping  10/13/2024 1056 by Lafe Elouise SAUNDERS, RN  Outcome: Discharged to Home  10/13/2024 0940 by Lafe Elouise SAUNDERS, RN  Outcome: Shift Focus  Goal: Optimal Functional Ability  Outcome: Discharged to Home  Intervention: Optimize Functional Ability  Recent Flowsheet Documentation  Taken 10/13/2024 1000 by Lafe Elouise SAUNDERS, RN  Activity Management: up ad lib  Taken 10/13/2024 0800 by Lafe Elouise SAUNDERS, RN  Activity Management: up ad lib  Goal: Absence of Infection Signs and Symptoms  Outcome: Discharged to Home  Goal: Improved Oral Intake  Outcome: Discharged to Home  Goal: Optimal Pain Control and Function  Outcome: Discharged to Home  Intervention: Prevent or Manage Pain  Recent Flowsheet Documentation  Taken 10/13/2024 1000 by Lafe Elouise SAUNDERS, RN  Sleep/Rest Enhancement:   awakenings minimized   consistent schedule promoted   natural light exposure provided   noise level reduced   regular sleep/rest pattern promoted  Taken 10/13/2024 0800 by Lafe Elouise SAUNDERS, RN  Sleep/Rest Enhancement:   awakenings minimized   consistent schedule promoted   natural light exposure provided   noise level reduced   regular sleep/rest pattern promoted  Goal: Skin Health and Integrity  Outcome: Discharged to Home  Intervention: Optimize Skin Protection  Recent Flowsheet Documentation  Taken 10/13/2024 1000 by Lafe Elouise SAUNDERS, RN  Activity Management: up ad lib  Pressure Reduction Techniques:   frequent weight shift encouraged   weight shift assistance provided  Head of Bed (HOB) Positioning: HOB at 30 degrees  Pressure Reduction Devices:   specialty bed utilized   pressure-redistributing mattress utilized   positioning supports utilized  Skin Protection: incontinence pads utilized  Taken 10/13/2024 0800 by Lafe Elouise SAUNDERS, RN  Activity Management: up ad lib  Pressure Reduction Techniques:   frequent weight shift encouraged   weight shift assistance provided  Head of Bed (HOB) Positioning: HOB at 30 degrees  Pressure Reduction Devices:   specialty bed utilized   pressure-redistributing mattress utilized   positioning supports utilized  Skin Protection: incontinence pads utilized  Goal: Optimal Wound Healing  Outcome: Discharged to Home  Intervention: Promote Wound Healing  Recent Flowsheet Documentation  Taken 10/13/2024 1000 by Lafe Elouise SAUNDERS, RN  Sleep/Rest Enhancement:   awakenings minimized   consistent schedule promoted   natural light exposure provided   noise level reduced   regular sleep/rest pattern promoted  Taken 10/13/2024 0800 by Lafe Elouise SAUNDERS, RN  Sleep/Rest Enhancement:   awakenings minimized   consistent schedule promoted   natural light exposure provided   noise level reduced   regular sleep/rest pattern promoted     Problem: Pain Acute  Goal: Optimal Pain Control and Function  Outcome: Discharged to Home  Intervention: Prevent or Manage Pain  Recent Flowsheet Documentation  Taken 10/13/2024 1000 by Lafe Elouise SAUNDERS, RN  Sleep/Rest Enhancement:   awakenings minimized   consistent schedule promoted   natural light exposure provided   noise level reduced   regular sleep/rest pattern promoted  Taken 10/13/2024 0800 by Lafe Elouise SAUNDERS, RN  Sleep/Rest Enhancement:   awakenings minimized   consistent schedule promoted   natural light exposure provided   noise level reduced   regular sleep/rest pattern promoted     Problem: Fall Injury Risk  Goal: Absence of Fall and Fall-Related Injury  Outcome: Discharged to Home  Intervention: Promote Injury-Free Environment  Recent Flowsheet Documentation  Taken 10/13/2024 1000 by Lafe Elouise SAUNDERS, RN  Safety Interventions:   fall reduction program maintained   lighting adjusted for tasks/safety   low bed   nonskid shoes/slippers when out of bed   room near unit station  Taken 10/13/2024 0800 by Lafe Elouise SAUNDERS, RN  Safety Interventions:   fall reduction program maintained   lighting adjusted for tasks/safety   low bed   nonskid shoes/slippers when out of bed   room near unit station     Problem: Nausea and Vomiting  Goal: Nausea and Vomiting Relief  Outcome: Discharged to Home

## 2024-10-13 NOTE — Discharge Instr - Other Orders (Signed)
 Patient admitted. No follow up required.

## 2024-10-13 NOTE — Plan of Care (Signed)
 Patient is POD #1 from a lap chole. VSS. Afebrile. O2 sats  stable on room air. Falls precautions maintained. Continue to monitor and notify the Provider of any changes in patient condition.       Shift Summary  Pain was managed with HYDROmorphone and acetaminophen , resulting in a decrease in pain score and gradual improvement.   Ondansetron  was given for nausea, with small and consistent emesis output and improvement noted in PRN response.   Surgical abdomen port sites and IV site remained clean, dry, and intact, with no new skin or wound issues.   Maintained mobility and activity, and utilized pressure reduction techniques and devices.   Overall, remained calm and cooperative, able to communicate needs and feelings, and continued to progress post-operatively.     Absence of Hospital-Acquired Illness or Injury: No new skin breakdown or wound complications noted at surgical abdomen port sites, and IV site remained clean, dry, and intact throughout the shift. Mobility and activity were maintained with no limitations, and pressure reduction techniques were utilized.     Optimal Coping: Remained calm and cooperative, able to express feelings and thoughts, and made self understood during the shift.     Optimal Pain Control and Function: Pain decreased from 8/10 to 6/10 after administration of HYDROmorphone and acetaminophen , with gradual improvement noted; pain location shifted from right/left to anterior and headache.     Nausea and Vomiting Relief: Ondansetron  was administered for nausea, and emesis remained small in amount and consistent in appearance; PRN response assessments noted improvement.       Problem: Adult Inpatient Plan of Care  Goal: Absence of Hospital-Acquired Illness or Injury  Outcome: Shift Focus  Intervention: Prevent Skin Injury  Recent Flowsheet Documentation  Taken 10/12/2024 2000 by Alvia Cough, RN  Positioning for Skin: Supine/Back  Skin Protection:   adhesive use limited   incontinence pads utilized  Intervention: Prevent and Manage VTE (Venous Thromboembolism) Risk  Recent Flowsheet Documentation  Taken 10/12/2024 2000 by Alvia Cough, RN  Anti-Embolism Device Status: Other (Comment)     Problem: Latex Allergy  Goal: Absence of Allergy Symptoms  Outcome: Shift Focus     Problem: Wound  Goal: Optimal Coping  Outcome: Shift Focus  Goal: Skin Health and Integrity  Intervention: Optimize Skin Protection  Recent Flowsheet Documentation  Taken 10/12/2024 2000 by Alvia Cough, RN  Pressure Reduction Techniques: frequent weight shift encouraged  Pressure Reduction Devices: pressure-redistributing mattress utilized  Skin Protection:   adhesive use limited   incontinence pads utilized     Problem: Pain Acute  Goal: Optimal Pain Control and Function  Outcome: Shift Focus     Problem: Nausea and Vomiting  Goal: Nausea and Vomiting Relief  Outcome: Shift Focus

## 2024-10-13 NOTE — Discharge Summary (Signed)
 Discharge Summary    Admit date: 10/11/2024    Discharge date and time: 10/13/24    Discharge to:  Home    Discharge Service: General Surgery    Discharge Attending Physician: No att. providers found    Discharge  Diagnoses: Acute cholecystitis    Secondary Diagnosis: Principal Problem:    Acute cholecystitis (POA: Unknown)  Active Problems:    Obesity (POA: Unknown)    Eczema (POA: Unknown)    Partial deafness (POA: Unknown)    Choledocholithiasis (POA: Unknown)  Resolved Problems:    * No resolved hospital problems. *      OR Procedures:    LAPAROSCOPIC CHOLECYSTECTOMY WITH CHOLANGIOGRAPHY  Date  10/12/2024  -------------------     Ancillary Procedures: no procedures    Discharge Day Services:     Subjective   No acute events overnight. Pain Controlled. No fever or chills.    Objective   No data found.  No intake/output data recorded.    Gen: Awake, alert, no acute distress  Card: Regular rate  Pulm: Breathing comfortable on RA  Abd: Soft, appropriately TTP, non-distended, incisions c/d/i  Neuro: Oriented to conversation, no gross deficits  Ext: Warm, well perfused    Hospital Course:  #RUQ pain  #Cholelithiasis  Epigastric/RUQ pain x1 day w/ N/V. Vitals normal. Labs notable for WBC 9.9, Tbili 1.7, alk phos 119, AST 257, ALT 214. No alcohol use. RUQ US  showed cholelithiasis and hepatic steatosis without gb wall thickening or biliary dilatation. UA negative, EKG without ischemic changes. Symptoms, labs, and imaging findings are most consistent with choledocholithiasis. She has lab values consistent with obstruction of the biliary tract in the setting of known gall stones. The differential includes symptomatic cholelithiasis (biliary colic) as she does not have signs of systemic inflammation (leukocytosis/fever) or inflammatory changes on imaging that would be expected in cholecystitis. Surgery consulted, given high suspicion of acute cholecystitis, pt admitted w/ plan for surgical eval. Planning for surgery today (11/13)     ---  38 year old female with a past medical history of depression, obesity, eczema, and HOH who presented to [facility] ED on 10/11/2024 for evaluation of right upper quadrant pain, nausea, and vomiting.    ED workup included right upper quadrant ultrasound which showed cholelithiasis and hepatic steatosis.  Labs notable for leukocytosis (WBC 14.2).  T. bili elevated at 2.3.  LFTs elevated (AST 259, ALT 106, alkaline phosphatase 119).  UA negative.    Her clinical picture and lab findings were concerning for symptomatic cholelithiasis versus acute cholecystitis versus choledocholithiasis. She was started on IV antibiotics.  She was admitted to the medicine team overnight for further treatment.    On hospital day #2, her T. bili slightly improved to 1.7. LFTs were overall stable.  Leukocytosis had resolved.  She underwent laparoscopic cholecystectomy with intraoperative cholangiogram on 10/12/2024 with Dr. Gianny Sabino.    Intraoperative findings consistent with mild gallbladder edema and multiple small gallstones. Intraoperative cholangiogram was negative for any filling defect or obstruction.  She tolerated the procedure well. She was extubated without complication and brought to the PACU for monitoring before being transferred to the floor. She was transferred from the medicine service to the general surgery service.     On POD#1, she recovered well from surgery, tolerated a diet, LFTs downtrending.     On the day of discharge 10/13/24, she was tolerating a regular diet, pain was controlled with oral medication, voiding spontaneously, and was ambulating with minimal assistance.  She was discharged in stable condition.    Recommend holding Wegovy in the immediate post-operative period, hold for one week.    Follow-up in 2 weeks in the general surgery clinic, an appointment has been requested.     Condition at Discharge: Improved  Discharge Medications:      Medication List      PAUSE taking these medications     WEGOVY 2.4 mg/0.75 mL injection pen; Wait to take this until: October 19, 2024; Generic drug: semaglutide (non-diabetes)     START taking these medications     ondansetron  4 MG disintegrating tablet; Commonly known as: ZOFRAN -ODT;   Dissolve 1 tablet (4 mg total) in the mouth every eight (8) hours as   needed for up to 10 doses.   oxyCODONE  5 MG immediate release tablet; Commonly known as: ROXICODONE ;   Take 1 tablet (5 mg total) by mouth every six (6) hours as needed for pain   for up to 10 doses.     CONTINUE taking these medications     * betamethasone  dipropionate 0.05 % ointment   * betamethasone  dipropionate 0.05 % cream; Apply twice a day to eczema   flare until the skin feels smooth, then stop. Do not apply to the skin on   the face, underarms or groin.   DUPIXENT  PEN 300 mg/2 mL pen injector; Generic drug: dupilumab ; Inject   the contents of 1 pen (300 mg total) under the skin every twenty-eight   (28) days.   ibuprofen  800 MG tablet; Commonly known as: MOTRIN    metroNIDAZOLE  0.75 % cream; Commonly known as: METROCREAM ; APPLY  CREAM   TOPICALLY TWICE DAILY TO  FACE   pantoprazole 40 MG tablet; Commonly known as: Protonix   phentermine 37.5 MG capsule   TRUEPLUS PEN NEEDLE 31 gauge x 1/4 (6 mm) Ndle; Generic drug: pen   needle, diabetic   VICTOZA  2-PAK 0.6 mg/0.1 mL (18 mg/3 mL) injection; Generic drug:   liraglutide   * This list has 2 medication(s) that are the same as other medications   prescribed for you. Read the directions carefully, and ask your doctor or   other care provider to review them with you.     STOP taking these medications     betamethasone  (augmented) 0.05 % cream; Commonly known as: DIPROLENE    clobetasol  0.05 % ointment; Commonly known as: TEMOVATE    meloxicam 15 MG tablet; Commonly known as: MOBIC   ondansetron  4 MG tablet; Commonly known as: ZOFRAN        Pending Test Results:     Discharge Instructions:  Activity:   Activity Instructions       Lifting restrictions (specify)      Weight restriction of 15-20 lbs. X 6 weeks.    OK to shower (no bath)            Diet:  Diet Instructions       Discharge diet (specify)      Discharge Nutrition Therapy: Regular    Recommend a bland diet for the first 24 hours after surgery, advance as tolerated.              Other Instructions:  Other Instructions       Call MD for:  difficulty breathing, headache or visual disturbances      Call MD for:  persistent nausea or vomiting      Call MD for:  redness, tenderness, or signs of infection (pain, swelling, redness, odor or green/yellow discharge  around incision site)      Call MD for:  severe uncontrolled pain      Call MD for: Temperature > 38.5 Celsius ( > 101.3 Fahrenheit)      Discharge instructions      LAPAROSCOPIC CHOLECYSTECTOMY WITH INTRAOPERATIVE CHOLANGIOGRAM     Your surgery was done through four small incisions on your abdomen. The incisions were closed with suture under your skin (the suture absorbs and does not need to be removed) and covered in surgical glue that will fall off, usually in a week or two.  Do not scrub your incisions.      You may shower after the procedure, but please avoid baths, hot tubs, pools, soaking or sitting in water for 2 weeks.  Please allow the soap and water to run over your incision.  Do not scrub your incision.      You may eat the same night of your surgery.  We encourage you to eat light meals for the first 12-24 hours because the anesthesia may make you nauseated.  Please avoid fried or greasy foods for the first month or so after surgery.  Some individuals may experience loose bowel movements for 3-4 weeks after having their gallbladder out and this can be exacerbated by eating fried or greasy foods.    Please avoid heavy lifting (15-20lbs) x 6 weeks. Listen to your body and if what you are doing hurts, stop. We do encourage you to walk after your surgery. You should start small, ie - walking to the bathroom or the kitchen, and increase your walking daily.    Bruising and swelling are normal in the days following your operation.  If you notice excessive bruising or note visible swelling around your incision sites, you may call for advice and/or report to your nearest emergency department.    Other signs and symptoms that you should call the office about include a fever or greater than 101.5??F, chills, yellowing of the skin or whites of the eyes, redness or your skin becoming hot around your incision, and any fluids coming out of your incisions.    You were prescribed an oral narcotic pain medication; you do not have to take it if you do not need it, or it can be taken in a smaller does and/or less often than written.  Alternatives to the prescription include Tylenol  650 mg every 6 hours and ibuprofen  800 mg every 8 hours; ibuprofen  and Tylenol  can be taken together and if taken with the prescription, will make the prescription work better so less needs to be taken.  DO NOT TAKE TYLENOL  OR IBUPROFEN  IF YOU ARE ALLERGIC.  Do not drive or operate heavy machinery while taking narcotic pain medications.      The oral pain medication has a constipating effect, so we strongly recommend using a stool softener with your pain medication.  Walking and adequate hydration will also help you to have soft bowel movements.  If you are having difficulty with bowel movements, use over-the-counter Milk of Magnesia, 30ml (1oz) daily.  If this does not produce the desired effect, you may increase this to 60ml (2oz) daily.  Drink 8oz of water following each dose for maximum benefit.    Please hold your Wegovy for one week after surgery.     We would like to see you in our clinic for follow-up in 2 weeks.  Please schedule your follow-up appointment by calling the phone numbers below:  Scheduling: [phone]  RN / Clinic:  [phone] EXT [phone]    If you have questions regarding your surgery during normal business hours, please call our RN/Clinic number at [phone] EXT [phone]. After normal business hours, call [phone] and ask for the [facility] Surgery On Call.     In the case of an emergency regarding your surgery, we recommend you go to the nearest Emergency Department.    Patient to schedule follow-up appointment with surgeon (specify time frame):      2 weeks      Patient admitted.  No follow up required.         Labs and Other Follow-ups after Discharge:  Follow Up instructions and Outpatient Referrals     Call MD for:  difficulty breathing, headache or visual disturbances      Call MD for:  persistent nausea or vomiting      Call MD for:  redness, tenderness, or signs of infection (pain, swelling,   redness, odor or green/yellow discharge around incision site)      Call MD for:  severe uncontrolled pain      Call MD for: Temperature > 38.5 Celsius ( > 101.3 Fahrenheit)      Discharge instructions          Future Appointments:  Appointments which have been scheduled for you      Oct 31, 2024 1:00 PM  (Arrive by 12:40 PM)  POST OP with Delon DELENA Lesser, MD  [facility] GENERAL AND BARIATRIC SURGERY [facility]) [address]  [address]  [place] [address]  [phone]             General and Acute Care Surgery Faculty:  I have seen the patient with the resident, discussed the case, performed physical examination, reviewed imaging and labs and agree with above documentation.    Akemi has been feeling much better with good pain control, normalizing labs, and good tolerance to enteral nutrition. She is stable for discharge to home and will follow-up in 1-2 weeks for post-op visit.    Delon Lesser, MD  General and Acute Care Surgery

## 2024-10-13 NOTE — Telephone Encounter (Signed)
 Error

## 2024-10-30 NOTE — Progress Notes (Signed)
 Samantha Miles has been contacted in regards to their refill of DUPIXENT  PEN 300 mg/2 mL pen injector (dupilumab ) . At this time, they have declined refill due to patient having 1 doses remaining. Refill assessment call date has been updated per the patient's request.

## 2024-10-31 ENCOUNTER — Encounter: Admit: 2024-10-31 | Discharge: 2024-10-31 | Payer: Medicare (Managed Care) | Attending: Family | Primary: Family

## 2024-10-31 DIAGNOSIS — Z4889 Encounter for other specified surgical aftercare: Principal | ICD-10-CM

## 2024-10-31 DIAGNOSIS — Z9049 Acquired absence of other specified parts of digestive tract: Principal | ICD-10-CM

## 2024-10-31 DIAGNOSIS — R8290 Unspecified abnormal findings in urine: Principal | ICD-10-CM

## 2024-10-31 LAB — URINALYSIS WITH MICROSCOPY WITH CULTURE REFLEX PERFORMABLE
BILIRUBIN UA: NEGATIVE
BLOOD UA: NEGATIVE
GLUCOSE UA: NEGATIVE
KETONES UA: NEGATIVE
LEUKOCYTE ESTERASE UA: NEGATIVE
NITRITE UA: NEGATIVE
PH UA: 7.5 (ref 5.0–9.0)
PROTEIN UA: NEGATIVE
RBC UA: 1 /HPF (ref <=4)
SPECIFIC GRAVITY UA: 1.023 (ref 1.003–1.030)
SQUAMOUS EPITHELIAL: 2 /HPF (ref 0–5)
UROBILINOGEN UA: <2
WBC UA: 1 /HPF (ref 0–5)

## 2024-10-31 NOTE — Patient Instructions (Signed)
 You were seen in clinic today for postop gallbladder removal    1.  Continue with lifting restrictions until 11/23/24 no more than 20lb  2.  Continue Tylenol  or Motrin  as needed for pain control.  3.  Follow-up with primary care physician      If you have any questions, please don't hesitate to call our office:    [facility] Surgery  Outpatient [facility]  [address]  [place], [place]  [address]      To schedule, reschedule or cancel a clinic appointment:  [phone]       -Contact Information for Nurses:  [phone] option 3 for Clinic Nurse  [phone]: Surgery Scheduler (if you need to change your OR Date)  Fax: [phone]    During regular business hours: (Monday-Friday, 8am-4:30 pm) you may call the General Surgery Office at [phone]    For emergencies outside of business hours please call the [facility] operator at [phone] and ask for the Surgical Resident on-call. You will be directed to a surgery resident who may not be familiar with your specific case, but can help you deal with any emergencies that cannot wait until regular business hours. Please be aware that this physician is responding to many in-hospital emergencies and patient issues and may not answer your phone call immediately.    If you are unable to reach the office, please go to [facility] or your local emergency room.      If the provider you saw today recommended a test such as CT scan, MRI or Ultrasound, you will need to call the number listed below to schedule.  If a referral was placed, you should be contacted by that office, however if you have not been contacted within 1-2 weeks, please call to set up an appointment date and time.     Contact information for referrals and test scheduling:      Important  Contact Numbers:  Patient Relations: [phone]    Financial Counselor: [phone]

## 2024-10-31 NOTE — Progress Notes (Signed)
 Pt returns for postop. She had a lap chole on 11/13. She has been having some nausea. [facility] but has been on it for around a year. Nausea does not improve or worsen with eating.

## 2024-10-31 NOTE — Progress Notes (Signed)
 [facility] GENERAL SURGERY  POSTOP CLINIC NOTE          Patient Name: Samantha Miles  Medical Record Number: [id number]  Date of Service: 10/31/2024    Reason for Visit: Follow up s/p cholecystectomy with cholangiogram 10/12/2024 by Dr. Catalina    HPI: Samantha Miles is a 38 y.o. female     History of Present Illness  Samantha Miles is a 38 year old female who presents with persistent nausea following recent gallbladder surgery.    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  - Fatigue present but able to maintain daily responsibilities  - No fevers, chills, constipation, diarrhea, or blood in the stool    Urinary symptoms  - Unusual odor in urine  - No dysuria, increased frequency, or urgency  - Manages urinary tract infections as she arises, does not take daily prophylactic medication    Nutritional status  - Not consuming adequate protein in diet      Path: Findings discussed with patient  Label: Gallbladder  Received: Previously opened  Measurements: 7.5 x 2.8 x 2.2 cm   External surface: Smooth, bile-stained tan-green  Luminal Contents: Container holds thin dark green fluid  Stones: Many (greater than 100) round dark brown calculi are present and range from 0.2 cm up to 0.5 cm.  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    - Fragment of liver with cautery and no significant pathologic changes.      Reivew of Systems:  Negative except otherwise noted in the HPI.    Physical Exam:  Vitals:    10/31/24 1257   BP: 133/71   Pulse: 83   Temp: 36.6 ??C (97.9 ??F)   SpO2: 98%       Physical Exam  Vitals and nursing note reviewed.   Constitutional:       General: She is not in acute distress.     Appearance: Normal appearance.   Eyes:      Conjunctiva/sclera: Conjunctivae normal.   Cardiovascular:      Rate and Rhythm: Normal rate and regular rhythm.      Pulses: Normal pulses.      Heart sounds: Normal heart sounds.   Pulmonary:      Effort: Pulmonary effort is normal.      Breath sounds: Normal breath sounds.   Abdominal:      General: Abdomen is flat. Bowel sounds are normal.      Palpations: Abdomen is soft.   Musculoskeletal:         General: Normal range of motion.   Skin:     General: Skin is warm and dry.      Comments: Incisional sites: edges approximated, no swelling/warmth/drainage   Neurological:      General: No focal deficit present.      Mental Status: She is alert and oriented to person, place, and time.  Psychiatric:         Mood and Affect: Mood normal.         Behavior: Behavior normal.         Thought Content: Thought content normal.         Judgment: Judgment normal.           Assessment and Plan: Samantha Miles is a 38 y.o. female   Problem List Items Addressed This Visit    None  Visit Diagnoses         Malodorous urine    -  Primary    Relevant Orders    Urinalysis with Microscopy with Culture Reflex      Postoperative visit          S/P cholecystectomy              Will check UA with reflux today, will treat accordingly  Push fluids and ensure adequate protein intake    Patient returns to clinic today with no new concerns or complaints.  On exam no signs or symptoms of infection is noted and patient is healing well.    Patient denies any fever, pain, incisional redness or drainage. Reports eating and drinking fine and denies any nausea, vomiting, diarrhea or constipation.    Discussed with patient continue lifting restriction of not lifting anything heavier than 20 lbs for a full 4 weeks post-operatively.  At that point can slowly advance but if any pain or pulling noted at the incision site should back off.     For pain control, discussed with patient to continue OTC Tylenol  500mg  or Motrin  400-600mg  every 6 hours  as needed, just to take with food.     Discussed with the patient at this time, does not need to follow-up with the general surgery clinic only on an as-needed basis.  If any new questions or concerns should arise to give the clinic a call, otherwise continue follow-up with their primary care physician.  Patient was agreeable to plan

## 2024-12-21 NOTE — Progress Notes (Signed)
 Samantha Miles [facility]ag[facility] has been contacted in regards to their refill of DUPIXENT  PEN 300 mg/2 mL pen injector (dupilumab ). At this time, they have declined refill due to Can't afford it this month . Refill assessment call date has been updated per the patient's request.
